# Patient Record
Sex: Female | Born: 1965
Health system: Southern US, Community
[De-identification: ages and names within clinical notes are randomized; demographics above are authoritative.]

## PROBLEM LIST (undated history)

## (undated) DIAGNOSIS — M419 Scoliosis, unspecified: Secondary | ICD-10-CM

## (undated) DIAGNOSIS — N183 Chronic kidney disease, stage 3 unspecified: Secondary | ICD-10-CM

## (undated) DIAGNOSIS — R7301 Impaired fasting glucose: Secondary | ICD-10-CM

## (undated) DIAGNOSIS — G43909 Migraine, unspecified, not intractable, without status migrainosus: Secondary | ICD-10-CM

## (undated) DIAGNOSIS — G4700 Insomnia, unspecified: Secondary | ICD-10-CM

## (undated) DIAGNOSIS — Q249 Congenital malformation of heart, unspecified: Secondary | ICD-10-CM

## (undated) DIAGNOSIS — E538 Deficiency of other specified B group vitamins: Secondary | ICD-10-CM

## (undated) DIAGNOSIS — I5189 Other ill-defined heart diseases: Secondary | ICD-10-CM

## (undated) DIAGNOSIS — Z1211 Encounter for screening for malignant neoplasm of colon: Secondary | ICD-10-CM

## (undated) DIAGNOSIS — G9332 Myalgic encephalomyelitis/chronic fatigue syndrome: Secondary | ICD-10-CM

## (undated) DIAGNOSIS — M797 Fibromyalgia: Secondary | ICD-10-CM

## (undated) DIAGNOSIS — U071 COVID-19: Secondary | ICD-10-CM

## (undated) DIAGNOSIS — I10 Essential (primary) hypertension: Secondary | ICD-10-CM

## (undated) DIAGNOSIS — J309 Allergic rhinitis, unspecified: Secondary | ICD-10-CM

## (undated) DIAGNOSIS — M47812 Spondylosis without myelopathy or radiculopathy, cervical region: Secondary | ICD-10-CM

## (undated) HISTORY — PX: CERVICAL SPINE SURGERY: SHX589

## (undated) HISTORY — DX: Encounter for screening for malignant neoplasm of colon: Z12.11

## (undated) HISTORY — DX: Myalgic encephalomyelitis/chronic fatigue syndrome: G93.32

## (undated) HISTORY — DX: Migraine, unspecified, not intractable, without status migrainosus: G43.909

## (undated) HISTORY — DX: Deficiency of other specified B group vitamins: E53.8

## (undated) HISTORY — PX: COLONOSCOPY: SHX174

## (undated) HISTORY — DX: Spondylosis without myelopathy or radiculopathy, cervical region: M47.812

## (undated) HISTORY — DX: Other ill-defined heart diseases: I51.89

## (undated) HISTORY — PX: CHOLECYSTECTOMY: SHX55

## (undated) HISTORY — DX: Chronic kidney disease, stage 3 unspecified: N18.30

## (undated) HISTORY — PX: APPENDECTOMY: SHX54

## (undated) HISTORY — PX: FOOT SURGERY: SHX648

## (undated) HISTORY — DX: Allergic rhinitis, unspecified: J30.9

## (undated) HISTORY — DX: COVID-19: U07.1

## (undated) HISTORY — PX: SPINE SURGERY: SHX786

## (undated) HISTORY — PX: CARDIAC SURGERY: SHX584

---
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
## 2004-01-22 ENCOUNTER — Ambulatory Visit (HOSPITAL_COMMUNITY): Admission: RE | Admit: 2004-01-22 | Discharge: 2004-01-22 | Payer: Self-pay | Admitting: Neurology

## 2004-02-07 ENCOUNTER — Ambulatory Visit (HOSPITAL_COMMUNITY): Admission: RE | Admit: 2004-02-07 | Discharge: 2004-02-08 | Payer: Self-pay | Admitting: Neurological Surgery

## 2004-03-11 ENCOUNTER — Encounter: Admission: RE | Admit: 2004-03-11 | Discharge: 2004-03-11 | Payer: Self-pay | Admitting: Neurological Surgery

## 2004-05-20 ENCOUNTER — Encounter: Admission: RE | Admit: 2004-05-20 | Discharge: 2004-05-20 | Payer: Self-pay | Admitting: Neurological Surgery

## 2004-12-16 ENCOUNTER — Encounter: Admission: RE | Admit: 2004-12-16 | Discharge: 2004-12-16 | Payer: Self-pay | Admitting: Neurological Surgery

## 2004-12-23 ENCOUNTER — Encounter: Admission: RE | Admit: 2004-12-23 | Discharge: 2004-12-23 | Payer: Self-pay | Admitting: Neurological Surgery

## 2008-02-12 ENCOUNTER — Emergency Department (HOSPITAL_COMMUNITY): Admission: EM | Admit: 2008-02-12 | Discharge: 2008-02-12 | Payer: Self-pay | Admitting: Emergency Medicine

## 2008-02-24 ENCOUNTER — Ambulatory Visit (HOSPITAL_COMMUNITY): Admission: RE | Admit: 2008-02-24 | Discharge: 2008-02-24 | Payer: Self-pay | Admitting: Cardiovascular Disease

## 2008-03-16 ENCOUNTER — Ambulatory Visit (HOSPITAL_COMMUNITY): Admission: RE | Admit: 2008-03-16 | Discharge: 2008-03-16 | Payer: Self-pay | Admitting: Cardiology

## 2008-03-16 ENCOUNTER — Encounter (INDEPENDENT_AMBULATORY_CARE_PROVIDER_SITE_OTHER): Payer: Self-pay | Admitting: Cardiology

## 2008-06-07 ENCOUNTER — Inpatient Hospital Stay (HOSPITAL_COMMUNITY): Admission: RE | Admit: 2008-06-07 | Discharge: 2008-06-08 | Payer: Self-pay | Admitting: Cardiology

## 2008-06-08 ENCOUNTER — Encounter (INDEPENDENT_AMBULATORY_CARE_PROVIDER_SITE_OTHER): Payer: Self-pay | Admitting: Cardiology

## 2008-10-09 ENCOUNTER — Ambulatory Visit: Payer: Self-pay | Admitting: Vascular Surgery

## 2008-10-09 ENCOUNTER — Encounter (INDEPENDENT_AMBULATORY_CARE_PROVIDER_SITE_OTHER): Payer: Self-pay | Admitting: Emergency Medicine

## 2008-10-09 ENCOUNTER — Emergency Department (HOSPITAL_COMMUNITY): Admission: EM | Admit: 2008-10-09 | Discharge: 2008-10-09 | Payer: Self-pay | Admitting: Emergency Medicine

## 2009-01-30 DIAGNOSIS — M6080 Other myositis, unspecified site: Secondary | ICD-10-CM | POA: Insufficient documentation

## 2009-01-30 DIAGNOSIS — I8000 Phlebitis and thrombophlebitis of superficial vessels of unspecified lower extremity: Secondary | ICD-10-CM | POA: Insufficient documentation

## 2009-01-30 DIAGNOSIS — I10 Essential (primary) hypertension: Secondary | ICD-10-CM | POA: Insufficient documentation

## 2009-01-30 DIAGNOSIS — IMO0001 Reserved for inherently not codable concepts without codable children: Secondary | ICD-10-CM | POA: Insufficient documentation

## 2009-01-30 DIAGNOSIS — G43909 Migraine, unspecified, not intractable, without status migrainosus: Secondary | ICD-10-CM | POA: Insufficient documentation

## 2010-12-28 ENCOUNTER — Encounter: Payer: Self-pay | Admitting: Neurology

## 2011-04-22 NOTE — Cardiovascular Report (Signed)
NAMELASONDRA, Courtney Haynes                ACCOUNT NO.:  [id number]   MEDICAL RECORD NO.:  [id number]          PATIENT TYPE:  OIB   LOCATION:  3705                         FACILITY:  [facility]   PHYSICIAN:  Vonna Kotyk R. Jacinto Halim, MD       DATE OF BIRTH:  1966/11/16   DATE OF PROCEDURE:  06/07/2008  DATE OF DISCHARGE:                            CARDIAC CATHETERIZATION   REFERRING PHYSICIAN:  Casimiro Needle L. Thad Ranger, MD   PROCEDURE PERFORMED:  1. Right heart catheterization.  2. Intracardiac echo-guided closure of the large patent foramen ovale.   INDICATIONS:  Courtney Haynes is a pleasant 45 year old female with  chronic migraine headache, which are lifestyle limiting.  She also has a  fenestrated atrial septal defect with right ventricular enlargement  associated with shortness of breath.  Given this, we felt that  proceeding with closure of the fenestrated atrial septal defect was  indicated.  Hence, she is brought to the catheterization lab.   HEMODYNAMIC DATA:  The right RA pressure was 8/7, mean 5 mmHg.  RA  saturation 75%.   RV pressure 25/3, end-diastolic pressure 7 mmHg.   PA pressure 23/9, mean 16 mmHg.  Saturation was 79%.   Pulmonary capillary wedge was 12/13, mean 9 mmHg.  Aortic saturation was  100%.   Qp/Qs was 1.2.  Cardiac output was 2.13.  The cardiac index was 2.84 by  Fick.   INTRACARDIAC ECHO DATA:  The intracardiac echocardiogram was carefully  performed, and all the structures were well visualized.  There was  normal cardiac anatomy, and the four pulmonary veins were identified on  right and left.  There was no significant valvular pathology.  There was  a large PFO and a fenestrated atrial septal defect in the inferior limb,  which was small.  There was strongly positive contrast study for right-  to-left shunting.   INTERVENTION DATA:  Successful closure of the PFO with implantation of  18-mm cribriform septal occluder with successful closure of the PFO with  successful  closure of the atrial septal defect with postprocedure double  contrast evaluation revealed complete obliteration of shunt.  By color  flow, there was complete obliteration of bidirectional shunting also.   There was no immediate complication noted.   RECOMMENDATIONS:  The patient will be continued on aspirin probably  indefinitely and Plavix for a period of 3 months.  She will be  discharged home in the morning.  She will follow up with Dr. Nanetta Batty in 2 weeks.   SHE WILL NEED ENDOCARDITIS PROPHYLAXIS FOR A PERIOD OF 6 MONTHS  FOLLOWING THE CLOSURE.   TECHNICAL PROCEDURE:  Under usual sterile precautions, using an 8-French  right femoral venous and a 9-French right femoral venous access, a  balloon-tip Swan-Ganz catheter was easily advanced into the RA, RV, and  PA, and right hemodynamics was carefully performed and the data was  analyzed and the catheter pulled out of body.   Intracardiac echo probe was advanced through the 9-French sheath and the  cardiac structures were carefully analyzed.   Giving heparin and keeping the ACT greater than  200, Amplatzer  cribriform septal occluder was advanced over a Mullen delivery sheath  after having crossed the large PFO very easily with a J-wire and placing  the J-wire into the left upper pulmonary vein.  The Amplatzer septal  occluder was advanced into the left atrium under echo guidance and the  left atrial disc was deployed again under echo guidance and then the  right atrial side of the device was deployed, and after confirming the  position of the device, the device was released with excellent results  and complete closure of the atrial septal defect.  The patient tolerated  the procedure well.  The catheters were then withdrawn out of the body.  The sheath was sutured in place.      Cristy Hilts. Jacinto Halim, MD  Electronically Signed     JRG/MEDQ  D:  06/07/2008  T:  06/07/2008  Job:  [id number]   cc:   Nanetta Batty, M.D.   Michael L. Thad Ranger, M.D.

## 2011-04-25 NOTE — Op Note (Signed)
NAME:  Courtney Haynes, Courtney Haynes                          ACCOUNT NO.:  [id number]   MEDICAL RECORD NO.:  [id number]                   PATIENT TYPE:  OIB   LOCATION:  3014                                 FACILITY:  [facility]   PHYSICIAN:  Tia Alert, MD                  DATE OF BIRTH:  1966/03/25   DATE OF PROCEDURE:  02/07/2004  DATE OF DISCHARGE:                                 OPERATIVE REPORT   PREOPERATIVE DIAGNOSIS:  Cervical disk herniation C6-7 to the right with  right C7 radiculopathy.   POSTOPERATIVE DIAGNOSIS:  Cervical disk herniation C6-7 to the right with  right C7 radiculopathy.   PROCEDURE:  1. Decompressive anterior cervical diskectomy C6-7 for central canal and     nerve root decompression.  2. Anterior cervical arthrodesis C6-7 utilizing a 7-mm Fibrillar allograft.  3. Anterior cervical plating C6-7 utilizing a 24-mm NuVasive anterior     cervical plate.   SURGEON:  Tia Alert, M.D.   ASSISTANT:  Donalee Citrin, M.D.   ANESTHESIA:  General endotracheal.   COMPLICATIONS:  None apparent.   INDICATIONS FOR PROCEDURE:  Courtney Haynes is a 45 year old white female who  presented with right arm pain.  She had undergone a previous ACDF at C5-6.  She had an MRI which showed a large disk herniation to the right at C6-7.  She had tried medical management for quite some time.  I recommended  anterior cervical diskectomy and fusion with plating at C6-7.  She  understood the risks, the benefits, and the alternatives and wished to  proceed.   DESCRIPTION OF PROCEDURE:  The patient was taken to the operating room and  after induction of adequate general endotracheal anesthesia, she was placed  in the supine position on the operating table.  Her left anterior cervical  region was prepped with Duraprep and then draped in the usual sterile  fashion.  We approached this from the left because she had a previous ACDF  from the left side.  Her left anterior cervical region was prepped  with  Duraprep and then draped in the usual sterile fashion.  Six mL of local  anesthesia was injected and then an incision was made through the old  incision and carried down to the platysma.  The platysma was elevated,  opened, and undermined with Metzenbaum scissors.  I then dissected in a  plane medial to the sternocleidomastoid muscle and lateral to the trachea  and esophagus to expose the anterior cervical spine at C6-7.  Intraoperative  fluoroscopy confirmed our level and then the longus coli muscles were taken  down bilaterally to expose the anterior cervical spine at C6-7.  The disk  space was incised with a 15-blade scalpel and initial diskectomy was done  with a pituitary rongeur and curved curets.  We then used the drill to drill  down the end plates down to the level of  the posterior longitudinal ligament  which was opened and then using the operating microscope for the remainder  of the procedure, the posterior longitudinal ligament along with the  posterior osteophytes were removed with the Kerrison punch.  She had a large  disk herniation noted at C6-7 on the right side.  This was removed with a  nerve hook.  We then were able to identify the C7 nerve root and follow it  out into the foramen past the pedicle level.  Once the decompression was  complete we irrigated with saline solution and then measured the interspace  to be 7 mm and tapped a 7-mm Fibrillar allograft into the interspace.  For  arthrodesis we then used a 24-mm NuVasive anterior cervical plate and then  placed two 13-mm screws into the body of C6 and two in the body of C7 and  then checked this under fluoroscopy to assure adequate plate placement.  We  then dried all bleeding points with bipolar cautery and irrigated with  copious amounts of bacitracin-containing saline solution.  Once meticulous  hemostasis was achieved, we closed the platysma with interrupted 3-0 Vicryl,  we closed the subcuticular tissue  with interrupted 3-0 Vicryl, and closed  the skin with Dermabond.  The drapes were removed and the patient was  awakened from general anesthesia and transported to the recovery room in  stable condition.  At the end of the procedure, all sponge, needle, and  instrument counts were correct.                                               Tia Alert, MD    DSJ/MEDQ  D:  02/07/2004  T:  02/07/2004  Job:  [id number]

## 2011-04-25 NOTE — Discharge Summary (Signed)
Courtney Haynes, Courtney Haynes                ACCOUNT NO.:  [id number]   MEDICAL RECORD NO.:  [id number]          PATIENT TYPE:  INP   LOCATION:  3705                         FACILITY:  [facility]   PHYSICIAN:  Cristy Hilts. Jacinto Halim, MD       DATE OF BIRTH:  1966-11-14   DATE OF ADMISSION:  06/07/2008  DATE OF DISCHARGE:  06/08/2008                               DISCHARGE SUMMARY   DISCHARGE DIAGNOSES:  1. Secundum atrial defect in the form of fenestrated atrial septal      defect, symptomatic.  2. Successful atrial septal defect closure.  3. Severe intractable migraine headaches.  4. Dyspnea on exertion.   DISCHARGE CONDITION:  Stable.   DISCHARGE MEDICATIONS:  1. Ambien CR as before at bedtime.  2. Rozerem 8 mg at bedtime as before.  3. Zyrtec 10 mg daily as before.  4. Zantac 150 mg at bedtime as before.  5. Carisoprodol 300 mg three times a day as before.  6. Aspirin 325 mg daily.  7. Trazodone 50 mg half a tablet at bedtime as before.  8. Keflex 500 mg every 6 hours as before.  9. Plavix 75 mg one daily for 3 months.  10.For all dental procedures, she need antibiotics prior to procedure.  11.She will always be on aspirin.   DISCHARGE INSTRUCTIONS:  1. Low-sodium, heart-healthy diet.  2. Increase activity slowly.  3. May shower or bathe.  4. No lifting for 2 days and no driving for 2 days.  5. Wash right groin cath site with soap and water.  Call us if any      bleeding, swelling, or drainage.  6. Follow up with Dr. Allyson Sabal on June 26, 2008, at 10:15 a.m.  7. She will need a repeat 2-D echo in 6 months and in 12 months.   HISTORY OF PRESENT ILLNESS:  A 45 year old patient of Dr. Hazle Coca  referred to Dr. Jacinto Halim for closure of atrial septal defect.  She has  severe intractable migraine headaches with fenestrated atrial septal  defect with right ventricular enlargement associated with shortness of  breath.  Dr. Jacinto Halim saw her in the office on June 02, 2008, and plans  were made for elective  closure of the ASD.  She was brought into the  hospital on June 07, 2008, as an outpatient for elective procedure and  underwent closure of the ASD and tolerated the procedure well.   By June 08, 2008, she was stable.  No chest pain or no shortness of  breath.  Vital signs were stable and ambulated without problems.  She  had a 2-D echo prior to discharge and she will follow up as an  outpatient.  She would need repeat echo's in 6 months and 12 months.   PROCEDURE:  ASD closure per Dr. Jacinto Halim on June 07, 2008.   LABORATORY VALUES:  1. Hemoglobin 13 and hematocrit 37.  Urine pregnancy test was      negative.  See Dr. Verl Dicker dictated, device insertion report.  2. A 2-D echo, LV size was normal.  LV systolic function normal.  No  left ventricular regional wall motion abnormalities.  Aortic valve      was trileaflet.  Aortic valve thickness was normal.  Normal aortic      valve leaflet excursion.  No evidence for an aortic valve stenosis.      No significant aortic valvular regurgitation.  No evidence for      mitral stenosis.  No significant mitral valve regurgitation.  RV      size was normal.  RV systolic function was normal.  RV wall      thickness was normal.  Right atrial size was normal.   No pericardial effusion.  Left atrial size was normal.  Intraatrial  septal closure device in place.  Device appears to be well seated with  no significant Doppler evidence or residual shunt.   The patient was discharged home and will follow up as an outpatient.      Darcella Gasman. [place], N.P.      Cristy Hilts. Jacinto Halim, MD  Electronically Signed    LRI/MEDQ  D:  08/01/2008  T:  08/02/2008  Job:  [id number]   cc:   Cristy Hilts. Jacinto Halim, MD  Marolyn Hammock. Thad Ranger, M.D.

## 2011-09-01 LAB — LIPID PANEL
Cholesterol: 113
LDL Cholesterol: 72
VLDL: 11

## 2011-09-01 LAB — I-STAT 8, (EC8 V) (CONVERTED LAB)
Bicarbonate: 24.6 — ABNORMAL HIGH
HCT: 38
Hemoglobin: 12.9
Operator id: 151321
Sodium: 136
TCO2: 26

## 2011-09-01 LAB — HEPATIC FUNCTION PANEL
AST: 17
Bilirubin, Direct: <0.1
Total Bilirubin: 0.5

## 2011-09-01 LAB — POCT CARDIAC MARKERS: Troponin i, poc: <0.05

## 2011-09-01 LAB — POCT I-STAT CREATININE: Operator id: 151321

## 2011-09-04 LAB — POCT I-STAT 3, VENOUS BLOOD GAS (G3P V)
Bicarbonate: 21.4
Bicarbonate: 22.9
O2 Saturation: 73
Operator id: 113391
Operator id: 113391
Operator id: 113391
TCO2: 24
pCO2, Ven: 39.4 — ABNORMAL LOW
pCO2, Ven: 41.2 — ABNORMAL LOW
pCO2, Ven: 41.4 — ABNORMAL LOW
pCO2, Ven: 43.9 — ABNORMAL LOW
pH, Ven: 7.326 — ABNORMAL HIGH
pH, Ven: 7.342 — ABNORMAL HIGH
pH, Ven: 7.35 — ABNORMAL HIGH
pH, Ven: 7.354 — ABNORMAL HIGH
pO2, Ven: 39
pO2, Ven: 41
pO2, Ven: 42

## 2011-09-04 LAB — HEMOGLOBIN AND HEMATOCRIT, BLOOD
HCT: 37.4
Hemoglobin: 13

## 2011-09-09 LAB — CBC
HCT: 37.8
Hemoglobin: 13.1
MCHC: 34.6
RBC: 4.06
RDW: 12.3

## 2011-09-09 LAB — POCT I-STAT, CHEM 8
Calcium, Ion: 1.2
Chloride: 102
Creatinine, Ser: 1
Glucose, Bld: 89
Potassium: 3.8

## 2011-09-09 LAB — DIFFERENTIAL
Basophils Relative: 1
Lymphs Abs: 2
Monocytes Absolute: 0.5
Monocytes Relative: 8
Neutro Abs: 3.1
Neutrophils Relative %: 53

## 2011-12-04 ENCOUNTER — Emergency Department
Admission: EM | Admit: 2011-12-04 | Discharge: 2011-12-04 | Disposition: A | Discharge status: Home / Self Care | Payer: Self-pay | Source: Home / Self Care

## 2011-12-04 ENCOUNTER — Encounter: Payer: Self-pay | Admitting: Emergency Medicine

## 2011-12-04 DIAGNOSIS — R6889 Other general symptoms and signs: Secondary | ICD-10-CM

## 2011-12-04 DIAGNOSIS — M797 Fibromyalgia: Secondary | ICD-10-CM

## 2011-12-04 DIAGNOSIS — J111 Influenza due to unidentified influenza virus with other respiratory manifestations: Secondary | ICD-10-CM

## 2011-12-04 DIAGNOSIS — IMO0001 Reserved for inherently not codable concepts without codable children: Secondary | ICD-10-CM

## 2011-12-04 HISTORY — DX: Fibromyalgia: M79.7

## 2011-12-04 MED ORDER — OSELTAMIVIR PHOSPHATE 75 MG PO CAPS: 75.0000 mg | ORAL_CAPSULE | Freq: Two times a day (BID) | ORAL | Status: AC

## 2011-12-04 MED ORDER — BENZONATATE 200 MG PO CAPS: 200.0000 mg | ORAL_CAPSULE | Freq: Every day | ORAL | Status: AC

## 2011-12-04 MED ORDER — OXYCODONE HCL 20 MG PO TB12
20.0000 mg | ORAL_TABLET | Freq: Two times a day (BID) | ORAL | Status: AC
Start: 2011-12-04 — End: 2011-12-18

## 2011-12-04 NOTE — ED Notes (Signed)
Congestion, fever, fatigue, aches, sore throat x 3 days.

## 2011-12-04 NOTE — ED Provider Notes (Signed)
History     CSN: [id number]  Arrival date & time 12/04/11  1408   None     Chief Complaint  Patient presents with  . Nasal Congestion      HPI Comments: HPI : Flu symptoms for about 2 days. Fever to 102 with chills, sweats, myalgias, fatigue, headache. Symptoms are progressively worsening, despite trying OTC fever reducing medicine and rest and fluids. Has decreased appetite, but tolerating some liquids by mouth.  Patient has not had a flu shot. She also has a history of fibromyalgia, and requests rx for small quantity of her oxycontin until follow-up with PCP  Review of Systems: Positive for fatigue, mild nasal congestion, mild sore throat, mild swollen anterior neck glands, mild cough. Negative for acute vision changes, stiff neck, focal weakness, syncope, seizures, respiratory distress, vomiting, diarrhea, GU symptoms.   The history is provided by the patient.    Past Medical History  Diagnosis Date  . Fibromyalgia     Past Surgical History  Procedure Date  . Migraines     No family history on file.  History  Substance Use Topics  . Smoking status: Never Smoker   . Smokeless tobacco: Not on file  . Alcohol Use: No    OB History    Grav Para Term Preterm Abortions TAB SAB Ect Mult Living                  Review of Systems  Allergies  Review of patient's allergies indicates no known allergies.  Home Medications   Current Outpatient Rx  Name Route Sig Dispense Refill  . CARISOPRODOL 350 MG PO TABS Oral Take 350 mg by mouth 4 (four) times daily as needed.      Marland Kitchen ELETRIPTAN HYDROBROMIDE 20 MG PO TABS Oral One tablet by mouth as needed for migraine headache.  If the headache improves and then returns, dose may be repeated after 2 hours have elapsed since first dose (do not exceed 80 mg per day). may repeat in 2 hours if necessary     . MOMETASONE FUROATE 50 MCG/ACT NA SUSP Nasal Place into the nose daily.      Marland Kitchen MONTELUKAST SODIUM 10 MG PO TABS Oral Take 10  mg by mouth at bedtime.      Marland Kitchen NARATRIPTAN HCL 2.5 MG PO TABS Oral Take 2.5 mg by mouth as needed. Take one (1) tablet at onset of headache; if returns or does not resolve, may repeat after 4 hours; do not exceed five (5) mg in 24 hours.     . OXYCODONE HCL ER 20 MG PO TB12 Oral Take 20 mg by mouth every 12 (twelve) hours.      Marland Kitchen ZOLPIDEM TARTRATE ER 12.5 MG PO TBCR Oral Take 12.5 mg by mouth at bedtime as needed.      Marland Kitchen BENZONATATE 200 MG PO CAPS Oral Take 1 capsule (200 mg total) by mouth at bedtime. Take as needed for cough 12 capsule 0  . OSELTAMIVIR PHOSPHATE 75 MG PO CAPS Oral Take 1 capsule (75 mg total) by mouth every 12 (twelve) hours. 10 capsule 0  . OXYCODONE HCL ER 20 MG PO TB12 Oral Take 1 tablet (20 mg total) by mouth every 12 (twelve) hours. As needed for fibromyalgia pain 6 tablet 0    BP 122/86  Pulse 109  Temp(Src) 98.8 F (37.1 C) (Oral)  Resp 16  Ht 5\' 5"  (1.651 m)  Wt 130 lb (58.968 kg)  BMI 21.63 kg/m2  SpO2 99%  LMP 11/04/2011  Physical Exam Nursing notes and Vital Signs reviewed. Appearance:  Patient appears healthy, stated age, and in no acute distress Eyes:  Pupils are equal, round, and reactive to light and accomodation.  Extraocular movement is intact.  Conjunctivae are not inflamed  Ears:  Canals normal.  Tympanic membranes normal.  Nose:  Mildly congested turbinates.  No sinus tenderness.   Pharynx:  Normal Neck:  Supple.  Tender shotty anterior/posterior nodes are palpated bilaterally  Lungs:  Clear to auscultation.  Breath sounds are equal.  Chest:  Distinct tenderness to palpation over the mid-sternum.  Heart:  Regular rate and rhythm without murmurs, rubs, or gallops.  Abdomen:  Nontender without masses or hepatosplenomegaly.  Bowel sounds are present.  No CVA or flank tenderness.  Extremities:  No edema.  No calf tenderness Skin:  No rash present.   ED Course  Procedures  none      1. Influenza-like illness   2. Fibromyalgia       MDM    Begin Tamiflu today.  Tessalon at bedtime.  Will give small quantity of Oxycontin (#6 tabs) until she can followup with her PCP for fibromyalgia. Take Mucinex D (guaifenesin with decongestant) twice daily for congestion.  Increase fluid intake, rest. May use Afrin nasal spray (or generic oxymetazoline) twice daily for about 5 days.  Also recommend using saline nasal spray several times daily and/or saline nasal irrigation. Stop all antihistamines for now, and other non-prescription cough/cold preparations. Follow-up with family doctor if not improving about 5 days.         Donna Christen, MD 12/04/11 [phone]

## 2011-12-04 NOTE — ED Notes (Signed)
Did not have Flu vaccination this season.

## 2012-03-25 DIAGNOSIS — M797 Fibromyalgia: Secondary | ICD-10-CM | POA: Insufficient documentation

## 2014-12-12 ENCOUNTER — Emergency Department
Admission: EM | Admit: 2014-12-12 | Discharge: 2014-12-12 | Disposition: A | Discharge status: Home / Self Care | Payer: BLUE CROSS/BLUE SHIELD | Source: Home / Self Care | Attending: Emergency Medicine | Admitting: Emergency Medicine

## 2014-12-12 ENCOUNTER — Encounter: Payer: Self-pay | Admitting: *Deleted

## 2014-12-12 DIAGNOSIS — J0120 Acute ethmoidal sinusitis, unspecified: Secondary | ICD-10-CM

## 2014-12-12 HISTORY — DX: Congenital malformation of heart, unspecified: Q24.9

## 2014-12-12 HISTORY — DX: Insomnia, unspecified: G47.00

## 2014-12-12 HISTORY — DX: Essential (primary) hypertension: I10

## 2014-12-12 HISTORY — DX: Scoliosis, unspecified: M41.9

## 2014-12-12 MED ORDER — AMOXICILLIN-POT CLAVULANATE 875-125 MG PO TABS: 1.0000 | ORAL_TABLET | Freq: Two times a day (BID) | ORAL | Status: DC

## 2014-12-12 NOTE — ED Notes (Signed)
Pt c/o nasal congestion and sinus pressure x 3 wks, post URI. Denies fever.

## 2014-12-12 NOTE — Discharge Instructions (Signed)

## 2014-12-12 NOTE — ED Provider Notes (Signed)
CSN: [id number]     Arrival date & time 12/12/14  1611 History   First MD Initiated Contact with Patient 12/12/14 1641     Chief Complaint  Patient presents with  . Nasal Congestion   (Consider location/radiation/quality/duration/timing/severity/associated sxs/prior Treatment) Patient is a 49 y.o. female presenting with cough. The history is provided by the patient. No language interpreter was used.  Cough Cough characteristics:  Non-productive Severity:  Moderate Onset quality:  Gradual Timing:  Constant Progression:  Worsening Context: upper respiratory infection   Relieved by:  Nothing Worsened by:  Nothing tried Associated symptoms: rhinorrhea and sinus congestion   Associated symptoms: no shortness of breath and no sore throat     Past Medical History  Diagnosis Date  . Fibromyalgia   . Hypertension   . Migraine   . Heart defect   . Insomnia   . Scoliosis    Past Surgical History  Procedure Laterality Date  . Migraines    . Back surgery    . Appendectomy    . Cardiac surgery    . Ankle surgery    . Cholecystectomy     Family History  Problem Relation Age of Onset  . Heart disease Father   . Diabetes Father   . Kidney disease Father   . Stroke Father    History  Substance Use Topics  . Smoking status: Never Smoker   . Smokeless tobacco: Not on file  . Alcohol Use: No   OB History    No data available     Review of Systems  HENT: Positive for rhinorrhea and sinus pressure. Negative for sore throat and tinnitus.   Respiratory: Positive for cough. Negative for shortness of breath.   All other systems reviewed and are negative. Pt complains of sinus pain and congestion.  Pt reports pain in area above eyes.  Pt reports she has had sinus infection and this feels the same.  Allergies  Review of patient's allergies indicates no known allergies.  Home Medications   Prior to Admission medications   Medication Sig Start Date End Date Taking? Authorizing  Provider  beclomethasone (QVAR) 40 MCG/ACT inhaler Inhale into the lungs 2 (two) times daily.   Yes Historical Provider, MD  Beclomethasone Diprop, Nasal, (QNASL NA) Place into the nose.   Yes Historical Provider, MD  carisoprodol (SOMA) 350 MG tablet Take 350 mg by mouth 4 (four) times daily as needed.     Yes Historical Provider, MD  eletriptan (RELPAX) 20 MG tablet One tablet by mouth as needed for migraine headache.  If the headache improves and then returns, dose may be repeated after 2 hours have elapsed since first dose (do not exceed 80 mg per day). may repeat in 2 hours if necessary    Yes Historical Provider, MD  montelukast (SINGULAIR) 10 MG tablet Take 10 mg by mouth at bedtime.     Yes Historical Provider, MD  naratriptan (AMERGE) 2.5 MG tablet Take 2.5 mg by mouth as needed. Take one (1) tablet at onset of headache; if returns or does not resolve, may repeat after 4 hours; do not exceed five (5) mg in 24 hours.    Yes Historical Provider, MD  oxyCODONE (OXYCONTIN) 20 MG 12 hr tablet Take 20 mg by mouth every 12 (twelve) hours.     Yes Historical Provider, MD  zolpidem (AMBIEN CR) 12.5 MG CR tablet Take 12.5 mg by mouth at bedtime as needed.     Yes Historical Provider, MD  zonisamide (ZONEGRAN) 50 MG capsule Take 50 mg by mouth daily.   Yes Historical Provider, MD  amoxicillin-clavulanate (AUGMENTIN) 875-125 MG per tablet Take 1 tablet by mouth every 12 (twelve) hours. 12/12/14   Elson AreasLeslie K Sofia, PA-C  mometasone (NASONEX) 50 MCG/ACT nasal spray Place into the nose daily.      Historical Provider, MD   BP 132/86 mmHg  Pulse 85  Temp(Src) 98.5 F (36.9 C) (Oral)  Resp 16  Ht 5\' 5"  (1.651 m)  Wt 140 lb (63.504 kg)  BMI 23.30 kg/m2  SpO2 98%  LMP 11/19/2014 Physical Exam  Constitutional: She is oriented to person, place, and time. She appears well-developed and well-nourished.  HENT:  Head: Normocephalic and atraumatic.  Mouth/Throat: Posterior oropharyngeal erythema present.   Tender maxillary sinuses, tender frontal and ethmoid area  Eyes: Conjunctivae and EOM are normal. Pupils are equal, round, and reactive to light.  Neck: Normal range of motion. Neck supple.  Cardiovascular: Normal rate.   Pulmonary/Chest: Effort normal.  Abdominal: Soft.  Musculoskeletal: Normal range of motion.  Neurological: She is alert and oriented to person, place, and time.  Skin: Skin is warm and dry.  Psychiatric: She has a normal mood and affect.    ED Course  Procedures (including critical care time) Labs Review Labs Reviewed - No data to display  Imaging Review No results found.   MDM   1. Acute ethmoidal sinusitis, recurrence not specified      Augmentin AVS Return if any problems.  Elson AreasLeslie K Sofia, PA-C 12/12/14 1837  Lonia SkinnerLeslie K CamasSofia, PA-C 12/12/14 Paulo [id number]

## 2015-09-07 ENCOUNTER — Encounter: Payer: Self-pay | Admitting: Emergency Medicine

## 2015-09-07 ENCOUNTER — Emergency Department
Admission: EM | Admit: 2015-09-07 | Discharge: 2015-09-07 | Disposition: A | Discharge status: Home / Self Care | Payer: Medicaid Other | Source: Home / Self Care | Attending: Family Medicine | Admitting: Family Medicine

## 2015-09-07 DIAGNOSIS — R2231 Localized swelling, mass and lump, right upper limb: Secondary | ICD-10-CM

## 2015-09-07 DIAGNOSIS — R590 Localized enlarged lymph nodes: Secondary | ICD-10-CM

## 2015-09-07 MED ORDER — CEPHALEXIN 500 MG PO CAPS: 500.0000 mg | ORAL_CAPSULE | Freq: Two times a day (BID) | ORAL | Status: DC

## 2015-09-07 NOTE — Discharge Instructions (Signed)

## 2015-09-07 NOTE — ED Notes (Addendum)
Knot in right axilla x 1 week

## 2015-09-07 NOTE — ED Provider Notes (Signed)
CSN: [id number]     Arrival date & time 09/07/15  1210 History   First MD Initiated Contact with Patient 09/07/15 1215     Chief Complaint  Patient presents with  . Abscess   (Consider location/radiation/quality/duration/timing/severity/associated sxs/prior Treatment) HPI  Pt is a 49yo female with hx of fibromyalgia, presenting to [facility] with concern for a tender Right axillary nodule for 1 week.  Pt states she thinks it may be an abscess. Pain is aching and sore. Knot has not increased in size since onset. She has had abscesses previously but states those usually worsen a lot faster.  Denies fever, chills, n/v/d.  Denies weight loss. Denies night sweats. Denies Right breast pain.  Pt does not have a PCP as she recently had a change in insurance and was told she could not establish care with a PCP on her new insurance for 6 months.  Pt states she does not want to have to wait this long as she is concerned she has an underlying infection causing the lump.  Past Medical History  Diagnosis Date  . Fibromyalgia   . Hypertension   . Migraine   . Heart defect   . Insomnia   . Scoliosis    Past Surgical History  Procedure Laterality Date  . Migraines    . Back surgery    . Appendectomy    . Cardiac surgery    . Ankle surgery    . Cholecystectomy     Family History  Problem Relation Age of Onset  . Heart disease Father   . Diabetes Father   . Kidney disease Father   . Stroke Father    Social History  Substance Use Topics  . Smoking status: Never Smoker   . Smokeless tobacco: None  . Alcohol Use: No   OB History    No data available     Review of Systems  Constitutional: Negative for fever, chills and unexpected weight change.  HENT: Negative for congestion, ear pain, sore throat, trouble swallowing and voice change.   Respiratory: Negative for cough and shortness of breath.   Cardiovascular: Negative for chest pain and palpitations.  Gastrointestinal: Negative for nausea,  vomiting, abdominal pain and diarrhea.  Musculoskeletal: Negative for myalgias, back pain and arthralgias.       Lump under Right axilla  Skin: Negative for rash and wound.  All other systems reviewed and are negative.   Allergies  Review of patient's allergies indicates not on file.  Home Medications   Prior to Admission medications   Medication Sig Start Date End Date Taking? Authorizing Provider  amoxicillin-clavulanate (AUGMENTIN) 875-125 MG per tablet Take 1 tablet by mouth every 12 (twelve) hours. 12/12/14   Elson Areas, PA-C  beclomethasone (QVAR) 40 MCG/ACT inhaler Inhale into the lungs 2 (two) times daily.    Historical Provider, MD  Beclomethasone Diprop, Nasal, (QNASL NA) Place into the nose.    Historical Provider, MD  carisoprodol (SOMA) 350 MG tablet Take 350 mg by mouth 4 (four) times daily as needed.      Historical Provider, MD  cephALEXin (KEFLEX) 500 MG capsule Take 1 capsule (500 mg total) by mouth 2 (two) times daily. For 10 days 09/07/15   Junius Finner, PA-C  eletriptan (RELPAX) 20 MG tablet One tablet by mouth as needed for migraine headache.  If the headache improves and then returns, dose may be repeated after 2 hours have elapsed since first dose (do not exceed 80 mg per day). may  repeat in 2 hours if necessary     Historical Provider, MD  mometasone (NASONEX) 50 MCG/ACT nasal spray Place into the nose daily.      Historical Provider, MD  montelukast (SINGULAIR) 10 MG tablet Take 10 mg by mouth at bedtime.      Historical Provider, MD  naratriptan (AMERGE) 2.5 MG tablet Take 2.5 mg by mouth as needed. Take one (1) tablet at onset of headache; if returns or does not resolve, may repeat after 4 hours; do not exceed five (5) mg in 24 hours.     Historical Provider, MD  oxyCODONE (OXYCONTIN) 20 MG 12 hr tablet Take 20 mg by mouth every 12 (twelve) hours.      Historical Provider, MD  zolpidem (AMBIEN CR) 12.5 MG CR tablet Take 12.5 mg by mouth at bedtime as needed.       Historical Provider, MD  zonisamide (ZONEGRAN) 50 MG capsule Take 50 mg by mouth daily.    Historical Provider, MD   Meds Ordered and Administered this Visit  Medications - No data to display  BP 128/84 mmHg  Pulse 69  Temp(Src) 97.9 F (36.6 C) (Oral)  Ht  (1.651 m)  Wt 155 lb (70.308 kg)  BMI 25.79 kg/m2  SpO2 99% No data found.   Physical Exam  Constitutional: She appears well-developed and well-nourished. No distress.  HENT:  Head: Normocephalic and atraumatic.  Eyes: Conjunctivae are normal. No scleral icterus.  Neck: Normal range of motion. Neck supple.  No midline bone tenderness, no crepitus or step-offs.   Cardiovascular: Normal rate, regular rhythm and normal heart sounds.   Pulmonary/Chest: Effort normal and breath sounds normal. No respiratory distress. She has no wheezes. She has no rales. She exhibits no tenderness.     No Right breast tenderness.  Abdominal: Soft. She exhibits no distension. There is no tenderness.  Musculoskeletal: Normal range of motion. She exhibits tenderness. She exhibits no edema.   Full ROM Right shoulder.   Neurological: She is alert.  Skin: Skin is warm and dry. She is not diaphoretic. No erythema.  Right axilla: skin in tact, no erythema, warmth or ecchymosis.  Nursing note and vitals reviewed.   ED Course  Procedures (including critical care time)  Labs Review Labs Reviewed - No data to display  Imaging Review No results found.   MDM   1. Axillary lump, right   2. Lymphadenopathy, axillary    Pt c/o lump under Right axilla. Exam most c/w lymphadenopathy of unknown origin. Low concern for abscess, however, pt does not currently have a PCP and quick access to f/u care. Pt also concerned for underlying infection. Discussed risks/benefits of trial of antibiotics. Pt would like to try a course of keflex.  In meantime, encouraged pt to speak with her case manager to help find a PCP that she can establish care with sooner  than 6 months out. Resource for [facility] breast center also provided (pt prefers going to [place] over [place]) as blood work and possible biopsy may need to be performed to help determine cause of Right axillary lymphadenopathy.   Patient verbalized understanding and agreement with treatment plan.     Junius Finner, PA-C 09/07/15 1357

## 2016-01-28 ENCOUNTER — Emergency Department
Admission: EM | Admit: 2016-01-28 | Discharge: 2016-01-28 | Disposition: A | Discharge status: Home / Self Care | Payer: Medicaid Other | Source: Home / Self Care | Attending: Family Medicine | Admitting: Family Medicine

## 2016-01-28 ENCOUNTER — Encounter: Payer: Self-pay | Admitting: Emergency Medicine

## 2016-01-28 DIAGNOSIS — J019 Acute sinusitis, unspecified: Secondary | ICD-10-CM

## 2016-01-28 DIAGNOSIS — H00016 Hordeolum externum left eye, unspecified eyelid: Secondary | ICD-10-CM

## 2016-01-28 MED ORDER — POLYMYXIN B-TRIMETHOPRIM 10000-0.1 UNIT/ML-% OP SOLN: 1.0000 [drp] | OPHTHALMIC | Status: DC

## 2016-01-28 MED ORDER — AMOXICILLIN-POT CLAVULANATE 875-125 MG PO TABS: 1.0000 | ORAL_TABLET | Freq: Two times a day (BID) | ORAL | Status: DC

## 2016-01-28 NOTE — ED Notes (Signed)
Sinus headache, pressure, runny nose, congestion, yellow mucus x 2-3 weeks Left eye red, swollen, hurts x 1 week

## 2016-01-28 NOTE — ED Provider Notes (Signed)
CSN: [id number]     Arrival date & time 01/28/16  1050 History   First MD Initiated Contact with Patient 01/28/16 1125     Chief Complaint  Patient presents with  . Sinus Problem   (Consider location/radiation/quality/duration/timing/severity/associated sxs/prior Treatment) HPI The pt is a 50yo female presenting to [facility] with c/o 2-3 weeks if sinus congestion, frontal headache with pressure and rhinorrhea, yellow mucous production, and now Left upper eyelid pain and redness for 1 week. She has been taking OTC cough/cold medication as well as using her Flonase and cough drops but no relief.  She is concerned she has a sinus infection. She has had low grade subjective fevers. Denies n/v/d. Denies change in vision. She does not wear contacts.  The county she [place] in did send out a flu advisory but she does not feel she has the flu as she has had primarily sinus symptoms for 2-3 weeks.   Past Medical History  Diagnosis Date  . Fibromyalgia   . Hypertension   . Migraine   . Heart defect   . Insomnia   . Scoliosis    Past Surgical History  Procedure Laterality Date  . Migraines    . Back surgery    . Appendectomy    . Cardiac surgery    . Ankle surgery    . Cholecystectomy     Family History  Problem Relation Age of Onset  . Heart disease Father   . Diabetes Father   . Kidney disease Father   . Stroke Father    Social History  Substance Use Topics  . Smoking status: Never Smoker   . Smokeless tobacco: None  . Alcohol Use: No   OB History    No data available     Review of Systems  Constitutional: Positive for fever ( subjective). Negative for chills.  HENT: Positive for congestion, postnasal drip, rhinorrhea and sinus pressure. Negative for ear pain, sore throat, trouble swallowing and voice change.   Eyes: Positive for pain, discharge, redness and itching. Negative for photophobia and visual disturbance.  Respiratory: Negative for cough and shortness of breath.     Cardiovascular: Negative for chest pain and palpitations.  Gastrointestinal: Negative for nausea, vomiting, abdominal pain and diarrhea.  Musculoskeletal: Negative for myalgias, back pain and arthralgias.  Skin: Negative for rash.  Neurological: Positive for headaches. Negative for dizziness and light-headedness.    Allergies  Review of patient's allergies indicates no known allergies.  Home Medications   Prior to Admission medications   Medication Sig Start Date End Date Taking? Authorizing Provider  fluticasone (FLONASE) 50 MCG/ACT nasal spray Place into both nostrils daily.   Yes Historical Provider, MD  amoxicillin-clavulanate (AUGMENTIN) 875-125 MG per tablet Take 1 tablet by mouth every 12 (twelve) hours. 12/12/14   Elson Areas, PA-C  amoxicillin-clavulanate (AUGMENTIN) 875-125 MG tablet Take 1 tablet by mouth 2 (two) times daily. One po bid x 10 days 01/28/16   Junius Finner, PA-C  beclomethasone (QVAR) 40 MCG/ACT inhaler Inhale into the lungs 2 (two) times daily.    Historical Provider, MD  Beclomethasone Diprop, Nasal, (QNASL NA) Place into the nose.    Historical Provider, MD  carisoprodol (SOMA) 350 MG tablet Take 350 mg by mouth 4 (four) times daily as needed.      Historical Provider, MD  cephALEXin (KEFLEX) 500 MG capsule Take 1 capsule (500 mg total) by mouth 2 (two) times daily. For 10 days 09/07/15   Junius Finner, PA-C  eletriptan (  RELPAX) 20 MG tablet One tablet by mouth as needed for migraine headache.  If the headache improves and then returns, dose may be repeated after 2 hours have elapsed since first dose (do not exceed 80 mg per day). may repeat in 2 hours if necessary     Historical Provider, MD  mometasone (NASONEX) 50 MCG/ACT nasal spray Place into the nose daily.      Historical Provider, MD  montelukast (SINGULAIR) 10 MG tablet Take 10 mg by mouth at bedtime.      Historical Provider, MD  naratriptan (AMERGE) 2.5 MG tablet Take 2.5 mg by mouth as needed. Take  one (1) tablet at onset of headache; if returns or does not resolve, may repeat after 4 hours; do not exceed five (5) mg in 24 hours.     Historical Provider, MD  oxyCODONE (OXYCONTIN) 20 MG 12 hr tablet Take 20 mg by mouth every 12 (twelve) hours.      Historical Provider, MD  trimethoprim-polymyxin b (POLYTRIM) ophthalmic solution Place 1 drop into the left eye every 4 (four) hours. For 5-7 days 01/28/16   Junius Finner, PA-C  zolpidem (AMBIEN CR) 12.5 MG CR tablet Take 12.5 mg by mouth at bedtime as needed.      Historical Provider, MD  zonisamide (ZONEGRAN) 50 MG capsule Take 50 mg by mouth daily.    Historical Provider, MD   Meds Ordered and Administered this Visit  Medications - No data to display  BP 141/89 mmHg  Pulse 83  Temp(Src) 98.1 F (36.7 C) (Oral)  Ht  (1.651 m)  Wt 140 lb (63.504 kg)  BMI 23.30 kg/m2  SpO2 98% No data found.   Physical Exam  Constitutional: She appears well-developed and well-nourished. No distress.  HENT:  Head: Normocephalic and atraumatic.  Right Ear: A middle ear effusion is present.  Left Ear: A middle ear effusion is present.  Nose: Mucosal edema and rhinorrhea present. Right sinus exhibits maxillary sinus tenderness and frontal sinus tenderness. Left sinus exhibits maxillary sinus tenderness and frontal sinus tenderness.  Mouth/Throat: Uvula is midline, oropharynx is clear and moist and mucous membranes are normal.  Eyes: Conjunctivae and EOM are normal. Pupils are equal, round, and reactive to light. Left eye exhibits hordeolum. Left eye exhibits no chemosis, no discharge and no exudate. No foreign body present in the left eye. Left conjunctiva is not injected. Left conjunctiva has no hemorrhage. No scleral icterus.    Left upper eyelid, medial canthus: mild erythema, edema, and tenderness. No active discharge.   EOMs normal, conjunctiva normal.  Neck: Normal range of motion. Neck supple.  Cardiovascular: Normal rate, regular rhythm and  normal heart sounds.   Pulmonary/Chest: Effort normal and breath sounds normal. No respiratory distress. She has no wheezes. She has no rales.  Abdominal: Soft. She exhibits no distension. There is no tenderness.  Musculoskeletal: Normal range of motion.  Neurological: She is alert.  Skin: Skin is warm and dry. She is not diaphoretic.  Nursing note and vitals reviewed.   ED Course  Procedures (including critical care time)  Labs Review Labs Reviewed - No data to display  Imaging Review No results found.    MDM   1. Acute rhinosinusitis   2. Hordeolum external, left    Pt c/o sinus congestion and pressure, gradually worsening for 2-3 weeks. Sinus tenderness noted on exam.  Left hordeolum also noted w/o evidence of periorbital cellulitis. Rx: Augmentin and Polytrim Encouraged pt to start PO antibiotics as  well as warm compresses for 3-4 days, if symptoms in her Left eye not improving, she may start using the ophthalmic drops.  If symptoms worsen, may start sooner.  F/u with PCP in 4-5 days if symptoms not improving. Patient verbalized understanding and agreement with treatment plan.     Junius Finner, PA-C 01/28/16 1148

## 2016-01-28 NOTE — Discharge Instructions (Signed)
You may take 400-600mg  Ibuprofen (Motrin) every 6-8 hours for fever and pain  Alternate with Tylenol  You may take  Tylenol every 4-6 hours as needed for fever and pain  Follow-up with your primary care provider next week for recheck of symptoms if not improving.  Be sure to drink plenty of fluids and rest, at least 8hrs of sleep a night, preferably more while you are sick. Return urgent care or go to closest ER if you cannot keep down fluids/signs of dehydration, fever not reducing with Tylenol, difficulty breathing/wheezing, stiff neck, worsening condition, or other concerns (see below)  Please take antibiotics as prescribed and be sure to complete entire course even if you start to feel better to ensure infection does not come back.   You may hold off on using the antibiotic eye drops for 4-5 days to see if the oral antibiotic, Augmentin, as well as warm compresses such as a warm damp washcloth 15-20 minutes at a time 2-3 times a day helps with Left eye symptoms.  If symptoms persist, you may start using the eye drops as prescribed.  If no improvement after 3-4 days of use, or symptoms worsen with use, please seek medical attention for further evaluation.    Sinus Rinse WHAT IS A SINUS RINSE? A sinus rinse is a simple home treatment that is used to rinse your sinuses with a sterile mixture of salt and water (saline solution). Sinuses are air-filled spaces in your skull behind the bones of your face and forehead that open into your nasal cavity. You will use the following:  Saline solution.  Neti pot or spray bottle. This releases the saline solution into your nose and through your sinuses. Neti pots and spray bottles can be purchased at [employer], a health food store, or online. WHEN WOULD I DO A SINUS RINSE? A sinus rinse can help to clear mucus, dirt, dust, or pollen from the nasal cavity. You may do a sinus rinse when you have a cold, a virus, nasal allergy symptoms, a sinus  infection, or stuffiness in the nose or sinuses. If you are considering a sinus rinse:  Ask your child's health care provider before performing a sinus rinse on your child.  Do not do a sinus rinse if you have had ear or nasal surgery, ear infection, or blocked ears. HOW DO I DO A SINUS RINSE?  Wash your hands.  Disinfect your device according to the directions provided and then dry it.  Use the solution that comes with your device or one that is sold separately in stores. Follow the mixing directions on the package.  Fill your device with the amount of saline solution as directed by the device instructions.  Stand over a sink and tilt your head sideways over the sink.  Place the spout of the device in your upper nostril (the one closer to the ceiling).  Gently pour or squeeze the saline solution into the nasal cavity. The liquid should drain to the lower nostril if you are not overly congested.  Gently blow your nose. Blowing too hard may cause ear pain.  Repeat in the other nostril.  Clean and rinse your device with clean water and then air-dry it. ARE THERE RISKS OF A SINUS RINSE?  Sinus rinse is generally very safe and effective. However, there are a few risks, which include:   A burning sensation in the sinuses. This may happen if you do not make the saline solution as directed. Make  sure to follow all directions when making the saline solution.  Infection from contaminated water. This is rare, but possible.  Nasal irritation.   This information is not intended to replace advice given to you by your health care provider. Make sure you discuss any questions you have with your health care provider.   Document Released: 06/21/2014 Document Reviewed: 06/21/2014 Elsevier Interactive Patient Education 2016 [employer].  Sinusitis, Adult Sinusitis is redness, soreness, and inflammation of the paranasal sinuses. Paranasal sinuses are air pockets within the bones of your face.  They are located beneath your eyes, in the middle of your forehead, and above your eyes. In healthy paranasal sinuses, mucus is able to drain out, and air is able to circulate through them by way of your nose. However, when your paranasal sinuses are inflamed, mucus and air can become trapped. This can allow bacteria and other germs to grow and cause infection. Sinusitis can develop quickly and last only a short time (acute) or continue over a long period (chronic). Sinusitis that lasts for more than 12 weeks is considered chronic. CAUSES Causes of sinusitis include:  Allergies.  Structural abnormalities, such as displacement of the cartilage that separates your nostrils (deviated septum), which can decrease the air flow through your nose and sinuses and affect sinus drainage.  Functional abnormalities, such as when the small hairs (cilia) that line your sinuses and help remove mucus do not work properly or are not present. SIGNS AND SYMPTOMS Symptoms of acute and chronic sinusitis are the same. The primary symptoms are pain and pressure around the affected sinuses. Other symptoms include:  Upper toothache.  Earache.  Headache.  Bad breath.  Decreased sense of smell and taste.  A cough, which worsens when you are lying flat.  Fatigue.  Fever.  Thick drainage from your nose, which often is green and may contain pus (purulent).  Swelling and warmth over the affected sinuses. DIAGNOSIS Your health care provider will perform a physical exam. During your exam, your health care provider may perform any of the following to help determine if you have acute sinusitis or chronic sinusitis:  Look in your nose for signs of abnormal growths in your nostrils (nasal polyps).  Tap over the affected sinus to check for signs of infection.  View the inside of your sinuses using an imaging device that has a light attached (endoscope). If your health care provider suspects that you have chronic  sinusitis, one or more of the following tests may be recommended:  Allergy tests.  Nasal culture. A sample of mucus is taken from your nose, sent to a lab, and screened for bacteria.  Nasal cytology. A sample of mucus is taken from your nose and examined by your health care provider to determine if your sinusitis is related to an allergy. TREATMENT Most cases of acute sinusitis are related to a viral infection and will resolve on their own within 10 days. Sometimes, medicines are prescribed to help relieve symptoms of both acute and chronic sinusitis. These may include pain medicines, decongestants, nasal steroid sprays, or saline sprays. However, for sinusitis related to a bacterial infection, your health care provider will prescribe antibiotic medicines. These are medicines that will help kill the bacteria causing the infection. Rarely, sinusitis is caused by a fungal infection. In these cases, your health care provider will prescribe antifungal medicine. For some cases of chronic sinusitis, surgery is needed. Generally, these are cases in which sinusitis recurs more than 3 times per year, despite  other treatments. HOME CARE INSTRUCTIONS  Drink plenty of water. Water helps thin the mucus so your sinuses can drain more easily.  Use a humidifier.  Inhale steam 3-4 times a day (for example, sit in the bathroom with the shower running).  Apply a warm, moist washcloth to your face 3-4 times a day, or as directed by your health care provider.  Use saline nasal sprays to help moisten and clean your sinuses.  Take medicines only as directed by your health care provider.  If you were prescribed either an antibiotic or antifungal medicine, finish it all even if you start to feel better. SEEK IMMEDIATE MEDICAL CARE IF:  You have increasing pain or severe headaches.  You have nausea, vomiting, or drowsiness.  You have swelling around your face.  You have vision problems.  You have a stiff  neck.  You have difficulty breathing.   This information is not intended to replace advice given to you by your health care provider. Make sure you discuss any questions you have with your health care provider.   Document Released: 11/24/2005 Document Revised: 12/15/2014 Document Reviewed: 12/09/2011 Elsevier Interactive Patient Education 2016 [employer]  Stye A stye is a bump on your eyelid caused by a bacterial infection. A stye can form inside the eyelid (internal stye) or outside the eyelid (external stye). An internal stye may be caused by an infected oil-producing gland inside your eyelid. An external stye may be caused by an infection at the base of your eyelash (hair follicle). Styes are very common. Anyone can get them at any age. They usually occur in just one eye, but you may have more than one in either eye.  CAUSES  The infection is almost always caused by bacteria called Staphylococcus aureus. This is a common type of bacteria that lives on your skin. RISK FACTORS You may be at higher risk for a stye if you have had one before. You may also be at higher risk if you have:  Diabetes.  Long-term illness.  Long-term eye redness.  A skin condition called seborrhea.  High fat levels in your blood (lipids). SIGNS AND SYMPTOMS  Eyelid pain is the most common symptom of a stye. Internal styes are more painful than external styes. Other signs and symptoms may include:  Painful swelling of your eyelid.  A scratchy feeling in your eye.  Tearing and redness of your eye.  Pus draining from the stye. DIAGNOSIS  Your health care provider may be able to diagnose a stye just by examining your eye. The health care provider may also check to make sure:  You do not have a fever or other signs of a more serious infection.  The infection has not spread to other parts of your eye or areas around your eye. TREATMENT  Most styes will clear up in a few days without treatment. In  some cases, you may need to use antibiotic drops or ointment to prevent infection. Your health care provider may have to drain the stye surgically if your stye is:  Large.  Causing a lot of pain.  Interfering with your vision. This can be done using a thin blade or a needle.  HOME CARE INSTRUCTIONS   Take medicines only as directed by your health care provider.  Apply a clean, warm compress to your eye for 10 minutes, 4 times a day.  Do not wear contact lenses or eye makeup until your stye has healed.  Do not try to pop  or drain the stye. SEEK MEDICAL CARE IF:  You have chills or a fever.  Your stye does not go away after several days.  Your stye affects your vision.  Your eyeball becomes swollen, red, or painful. MAKE SURE YOU:  Understand these instructions.  Will watch your condition.  Will get help right away if you are not doing well or get worse.   This information is not intended to replace advice given to you by your health care provider. Make sure you discuss any questions you have with your health care provider.   Document Released: 09/03/2005 Document Revised: 12/15/2014 Document Reviewed: 03/10/2014 Elsevier Interactive Patient Education [employer]

## 2016-02-01 ENCOUNTER — Telehealth: Payer: Self-pay

## 2016-07-28 ENCOUNTER — Emergency Department
Admission: EM | Admit: 2016-07-28 | Discharge: 2016-07-28 | Disposition: A | Discharge status: Home / Self Care | Payer: Medicaid Other | Source: Home / Self Care | Attending: Family Medicine | Admitting: Family Medicine

## 2016-07-28 ENCOUNTER — Encounter: Payer: Self-pay | Admitting: Emergency Medicine

## 2016-07-28 DIAGNOSIS — J329 Chronic sinusitis, unspecified: Secondary | ICD-10-CM

## 2016-07-28 DIAGNOSIS — R519 Headache, unspecified: Secondary | ICD-10-CM

## 2016-07-28 DIAGNOSIS — R51 Headache: Secondary | ICD-10-CM

## 2016-07-28 MED ORDER — PREDNISONE 20 MG PO TABS: ORAL_TABLET | ORAL | 0 refills | Status: DC

## 2016-07-28 MED ORDER — AMOXICILLIN-POT CLAVULANATE 875-125 MG PO TABS: 1.0000 | ORAL_TABLET | Freq: Two times a day (BID) | ORAL | 0 refills | Status: DC

## 2016-07-28 NOTE — ED Provider Notes (Signed)
CSN: [id number]     Arrival date & time 07/28/16  1447 History   First MD Initiated Contact with Patient 07/28/16 1507     Chief Complaint  Patient presents with  . Sinusitis   (Consider location/radiation/quality/duration/timing/severity/associated sxs/prior Treatment) HPI  Ovida B Willette ClusterYombor is a 50 y.o. female presenting to UC with c/o intermittent sinus congestion and sinus pain since June but states she has had a more persistent, and severe frontal sinus headache. Pain is worse when leaning her head over. She uses Flonase daily and has been taking Zyrtec, using Netti pot and acetaminophen and ibuprofen but only minimal relief. She notes other members of her family including her parents have had sinus infections this summer.  Denies fever, chills, n/v/d. She has seen ENT in the past, but not recently.  She does not currently have a PCP.    Past Medical History:  Diagnosis Date  . Fibromyalgia   . Heart defect   . Hypertension   . Insomnia   . Migraine   . Scoliosis    Past Surgical History:  Procedure Laterality Date  . ANKLE SURGERY    . APPENDECTOMY    . BACK SURGERY    . CARDIAC SURGERY    . CHOLECYSTECTOMY    . migraines     Family History  Problem Relation Age of Onset  . Heart disease Father   . Diabetes Father   . Kidney disease Father   . Stroke Father    Social History  Substance Use Topics  . Smoking status: Never Smoker  . Smokeless tobacco: Never Used  . Alcohol use No   OB History    No data available     Review of Systems  Constitutional: Negative for chills and fever.  HENT: Positive for congestion, rhinorrhea, sinus pressure, sneezing and sore throat. Negative for ear pain, tinnitus, trouble swallowing and voice change.   Respiratory: Negative for choking and shortness of breath.   Gastrointestinal: Negative for abdominal pain, diarrhea and nausea.  Neurological: Positive for headaches. Negative for dizziness and light-headedness.    Allergies   Review of patient's allergies indicates no known allergies.  Home Medications   Prior to Admission medications   Medication Sig Start Date End Date Taking? Authorizing Provider  amoxicillin-clavulanate (AUGMENTIN) 875-125 MG tablet Take 1 tablet by mouth 2 (two) times daily. One po bid x 7 days 07/28/16   Junius FinnerErin O'Malley, PA-C  Beclomethasone Diprop, Nasal, (QNASL NA) Place into the nose.    Historical Provider, MD  carisoprodol (SOMA) 350 MG tablet Take 350 mg by mouth 4 (four) times daily as needed.      Historical Provider, MD  eletriptan (RELPAX) 20 MG tablet One tablet by mouth as needed for migraine headache.  If the headache improves and then returns, dose may be repeated after 2 hours have elapsed since first dose (do not exceed 80 mg per day). may repeat in 2 hours if necessary     Historical Provider, MD  fluticasone (FLONASE) 50 MCG/ACT nasal spray Place into both nostrils daily.    Historical Provider, MD  mometasone (NASONEX) 50 MCG/ACT nasal spray Place into the nose daily.      Historical Provider, MD  montelukast (SINGULAIR) 10 MG tablet Take 10 mg by mouth at bedtime.      Historical Provider, MD  naratriptan (AMERGE) 2.5 MG tablet Take 2.5 mg by mouth as needed. Take one (1) tablet at onset of headache; if returns or does not resolve, may  repeat after 4 hours; do not exceed five (5) mg in 24 hours.     Historical Provider, MD  predniSONE (DELTASONE) 20 MG tablet 3 tabs po day one, then 2 po daily x 4 days 07/28/16   Junius FinnerErin O'Malley, PA-C  trimethoprim-polymyxin b (POLYTRIM) ophthalmic solution Place 1 drop into the left eye every 4 (four) hours. For 5-7 days 01/28/16   Junius FinnerErin O'Malley, PA-C  zolpidem (AMBIEN CR) 12.5 MG CR tablet Take 12.5 mg by mouth at bedtime as needed.      Historical Provider, MD  zonisamide (ZONEGRAN) 50 MG capsule Take 50 mg by mouth daily.    Historical Provider, MD   Meds Ordered and Administered this Visit  Medications - No data to display  BP 128/89 (BP  Location: Left Arm)   Pulse 81   Temp 98.2 F (36.8 C) (Oral)   Ht 5\' 5"  (1.651 m)   Wt 145 lb (65.8 kg)   LMP 07/25/2016   SpO2 98%   BMI 24.13 kg/m  No data found.   Physical Exam  Constitutional: She is oriented to person, place, and time. She appears well-developed and well-nourished. No distress.  HENT:  Head: Normocephalic and atraumatic.  Right Ear: Tympanic membrane normal.  Left Ear: Tympanic membrane normal.  Nose: Mucosal edema present. Right sinus exhibits maxillary sinus tenderness and frontal sinus tenderness. Left sinus exhibits maxillary sinus tenderness and frontal sinus tenderness.  Mouth/Throat: Uvula is midline, oropharynx is clear and moist and mucous membranes are normal.  Eyes: EOM are normal.  Neck: Normal range of motion. Neck supple.  Cardiovascular: Normal rate and regular rhythm.   Pulmonary/Chest: Effort normal and breath sounds normal. No respiratory distress. She has no wheezes. She has no rales.  Musculoskeletal: Normal range of motion.  Neurological: She is alert and oriented to person, place, and time.  Skin: Skin is warm and dry. She is not diaphoretic.  Psychiatric: She has a normal mood and affect. Her behavior is normal.  Nursing note and vitals reviewed.   Urgent Care Course   Clinical Course    Procedures (including critical care time)  Labs Review Labs Reviewed - No data to display  Imaging Review No results found.    MDM   1. Recurrent sinusitis   2. Sinus headache    Pt c/o worsening sinus congestion, pain and pressure that has been intermittent since June.  Sinus tenderness noted on exam.  Due to worsening symptoms, sinus tenderness and pain with leaning her head forward, will cover for bacterial cause.  Rx: prednisone and augmentin  Encouraged to establish care with a primary care provider for ongoing healthcare needs and f/u with ENT if she continues to have sinus headache and congestion. Patient verbalized  understanding and agreement with treatment plan.    Junius FinnerErin O'Malley, PA-C 07/28/16 1559

## 2016-07-28 NOTE — ED Triage Notes (Signed)
Sinus headache, pressure, runny nose, left eye was matted this morning, says she's been having this problem since the end of June its gotten worse in the last week.

## 2016-10-23 ENCOUNTER — Encounter: Payer: Self-pay | Admitting: Emergency Medicine

## 2016-10-23 ENCOUNTER — Emergency Department
Admission: EM | Admit: 2016-10-23 | Discharge: 2016-10-23 | Disposition: A | Discharge status: Home / Self Care | Payer: Medicaid Other | Source: Home / Self Care | Attending: Family Medicine | Admitting: Family Medicine

## 2016-10-23 DIAGNOSIS — J029 Acute pharyngitis, unspecified: Secondary | ICD-10-CM | POA: Diagnosis not present

## 2016-10-23 DIAGNOSIS — B9789 Other viral agents as the cause of diseases classified elsewhere: Secondary | ICD-10-CM

## 2016-10-23 DIAGNOSIS — J069 Acute upper respiratory infection, unspecified: Secondary | ICD-10-CM

## 2016-10-23 LAB — POCT RAPID STREP A (OFFICE): RAPID STREP A SCREEN: NEGATIVE

## 2016-10-23 MED ORDER — GUAIFENESIN-CODEINE 100-10 MG/5ML PO SOLN: ORAL | 0 refills | Status: DC

## 2016-10-23 MED ORDER — AMOXICILLIN 875 MG PO TABS: 875.0000 mg | ORAL_TABLET | Freq: Two times a day (BID) | ORAL | 0 refills | Status: DC

## 2016-10-23 MED ORDER — PREDNISONE 20 MG PO TABS: ORAL_TABLET | ORAL | 0 refills | Status: DC

## 2016-10-23 NOTE — ED Provider Notes (Signed)
[facility]    CSN: [id number] Arrival date & time: 10/23/16  1502     History   Chief Complaint Chief Complaint  Patient presents with  . Sore Throat    HPI Courtney Haynes is a 50 y.o. female.   Patient complains of 1.5 day history of typical cold-like symptoms including mild sore throat, sinus congestion, headache, fatigue, fever to 101, and cough.  She has developed intermittent wheezing and shortness of breath with activity. She has a history of seasonal rhinitis, and a past history of pneumonia. She has a family history of asthma (mother).   The history is provided by the patient.    Past Medical History:  Diagnosis Date  . Fibromyalgia   . Heart defect   . Hypertension   . Insomnia   . Migraine   . Scoliosis     There are no active problems to display for this patient.   Past Surgical History:  Procedure Laterality Date  . ANKLE SURGERY    . APPENDECTOMY    . BACK SURGERY    . CARDIAC SURGERY    . CHOLECYSTECTOMY    . migraines      OB History    No data available       Home Medications    Prior to Admission medications   Medication Sig Start Date End Date Taking? Authorizing Provider  amoxicillin (AMOXIL) 875 MG tablet Take 1 tablet (875 mg total) by mouth 2 (two) times daily. 10/23/16   Lattie HawStephen A Beese, MD  amoxicillin-clavulanate (AUGMENTIN) 875-125 MG tablet Take 1 tablet by mouth 2 (two) times daily. One po bid x 7 days 07/28/16   Junius FinnerErin O'Malley, PA-C  Beclomethasone Diprop, Nasal, (QNASL NA) Place into the nose.    Historical Provider, MD  carisoprodol (SOMA) 350 MG tablet Take 350 mg by mouth 4 (four) times daily as needed.      Historical Provider, MD  eletriptan (RELPAX) 20 MG tablet One tablet by mouth as needed for migraine headache.  If the headache improves and then returns, dose may be repeated after 2 hours have elapsed since first dose (do not exceed 80 mg per day). may repeat in 2 hours if necessary     Historical  Provider, MD  fluticasone (FLONASE) 50 MCG/ACT nasal spray Place into both nostrils daily.    Historical Provider, MD  guaiFENesin-codeine 100-10 MG/5ML syrup Take 10mL by mouth at bedtime as needed for cough 10/23/16   Lattie HawStephen A Beese, MD  mometasone (NASONEX) 50 MCG/ACT nasal spray Place into the nose daily.      Historical Provider, MD  montelukast (SINGULAIR) 10 MG tablet Take 10 mg by mouth at bedtime.      Historical Provider, MD  naratriptan (AMERGE) 2.5 MG tablet Take 2.5 mg by mouth as needed. Take one (1) tablet at onset of headache; if returns or does not resolve, may repeat after 4 hours; do not exceed five (5) mg in 24 hours.     Historical Provider, MD  predniSONE (DELTASONE) 20 MG tablet Take one tab by mouth twice daily for 5 days, then one daily. Take with food. 10/23/16   Lattie HawStephen A Beese, MD  trimethoprim-polymyxin b (POLYTRIM) ophthalmic solution Place 1 drop into the left eye every 4 (four) hours. For 5-7 days 01/28/16   Junius FinnerErin O'Malley, PA-C  zolpidem (AMBIEN CR) 12.5 MG CR tablet Take 12.5 mg by mouth at bedtime as needed.      Historical Provider, MD  zonisamide (  ZONEGRAN) 50 MG capsule Take 50 mg by mouth daily.    Historical Provider, MD    Family History Family History  Problem Relation Age of Onset  . Heart disease Father   . Diabetes Father   . Kidney disease Father   . Stroke Father     Social History Social History  Substance Use Topics  . Smoking status: Never Smoker  . Smokeless tobacco: Never Used  . Alcohol use No     Allergies   Patient has no known allergies.   Review of Systems Review of Systems + sore throat + cough No pleuritic pain + wheezing + nasal congestion + post-nasal drainage + sinus pain/pressure No itchy/red eyes No earache No hemoptysis + SOB + fever, + chills + nausea + vomiting No abdominal pain No diarrhea No urinary symptoms No skin rash + fatigue + myalgias + headache Used OTC meds without relief   Physical  Exam Triage Vital Signs ED Triage Vitals  Enc Vitals Group     BP 10/23/16 1534 130/92     Pulse Rate 10/23/16 1534 116     Resp --      Temp 10/23/16 1534 98.9 F (37.2 C)     Temp Source 10/23/16 1534 Oral     SpO2 10/23/16 1534 97 %     Weight 10/23/16 1534 150 lb (68 kg)     Height 10/23/16 1534 5\' 5"  (1.651 m)     Head Circumference --      Peak Flow --      Pain Score 10/23/16 1538 5     Pain Loc --      Pain Edu? --      Excl. in GC? --    No data found.   Updated Vital Signs BP 130/92 (BP Location: Left Arm)   Pulse 116   Temp 98.9 F (37.2 C) (Oral)   Ht 5\' 5"  (1.651 m)   Wt 150 lb (68 kg)   LMP 10/15/2016   SpO2 97%   BMI 24.96 kg/m   Visual Acuity Right Eye Distance:   Left Eye Distance:   Bilateral Distance:    Right Eye Near:   Left Eye Near:    Bilateral Near:     Physical Exam Nursing notes and Vital Signs reviewed. Appearance:  Patient appears stated age, and in no acute distress Eyes:  Pupils are equal, round, and reactive to light and accomodation.  Extraocular movement is intact.  Conjunctivae are not inflamed  Ears:  Canals normal.  Tympanic membranes normal.  Nose:  Congested turbinates.  No sinus tenderness.   Pharynx:  Uvula erythematous. Neck:  Supple.  Tender enlarged posterior/lateral nodes are palpated bilaterally  Lungs:  Clear to auscultation.  Breath sounds are equal.  Moving air well. Chest:  Distinct tenderness to palpation over the mid-sternum.  Heart:  Regular rate and rhythm without murmurs, rubs, or gallops.  Abdomen:  Nontender without masses or hepatosplenomegaly.  Bowel sounds are present.  No CVA or flank tenderness.  Extremities:  No edema.  Skin:  No rash present.    UC Treatments / Results  Labs (all labs ordered are listed, but only abnormal results are displayed) Labs Reviewed  POCT RAPID STREP A (OFFICE) negative    EKG  EKG Interpretation None       Radiology No results  found.  Procedures Procedures (including critical care time)  Medications Ordered in UC Medications - No data to display   Initial Impression /  Assessment and Plan / UC Course  I have reviewed the triage vital signs and the nursing notes.  Pertinent labs & imaging results that were available during my care of the patient were reviewed by me and considered in my medical decision making (see chart for details).  Clinical Course   With patient's history of seasonal rhinitis, family history of asthma, and past history of viral URI's that linger, she probably has a predisposition to mild reactive airways disease.  Begin prednisone burst/taper and amoxicillin. Rx for Robitussin AC for night time cough.  Take plain guaifenesin (1200mg  extended release tabs such as Mucinex) twice daily, with plenty of water, for cough and congestion.  Get adequate rest.   May use Afrin nasal spray (or generic oxymetazoline) twice daily for about 5 days and then discontinue.  Also recommend using saline nasal spray several times daily and saline nasal irrigation (AYR is a common brand).  Use Flonase nasal spray each morning after using Afrin nasal spray and saline nasal irrigation. Try warm salt water gargles for sore throat.  Stop all antihistamines for now, and other non-prescription cough/cold preparations.   Follow-up with family doctor if not improving about10 days.      Final Clinical Impressions(s) / UC Diagnoses   Final diagnoses:  Pharyngitis, unspecified etiology  Viral URI with cough    New Prescriptions New Prescriptions   AMOXICILLIN (AMOXIL) 875 MG TABLET    Take 1 tablet (875 mg total) by mouth 2 (two) times daily.   GUAIFENESIN-CODEINE 100-10 MG/5ML SYRUP    Take 10mL by mouth at bedtime as needed for cough   PREDNISONE (DELTASONE) 20 MG TABLET    Take one tab by mouth twice daily for 5 days, then one daily. Take with food.     Lattie HawStephen A Beese, MD 10/30/16 2206

## 2016-10-23 NOTE — ED Triage Notes (Signed)
Sore throat, Thick yellow mucus, congestion, headache, pressure in face and upper jaws, fever, chills, vomited twice yesterday 1.5 days

## 2016-10-23 NOTE — Discharge Instructions (Signed)
Take plain guaifenesin (1200mg extended release tabs such as Mucinex) twice daily, with plenty of water, for cough and congestion.  Get adequate rest.   °May use Afrin nasal spray (or generic oxymetazoline) twice daily for about 5 days and then discontinue.  Also recommend using saline nasal spray several times daily and saline nasal irrigation (AYR is a common brand).  Use Flonase nasal spray each morning after using Afrin nasal spray and saline nasal irrigation. °Try warm salt water gargles for sore throat.  °Stop all antihistamines for now, and other non-prescription cough/cold preparations. °  °Follow-up with family doctor if not improving about10 days.  °

## 2017-06-16 DIAGNOSIS — M797 Fibromyalgia: Secondary | ICD-10-CM | POA: Diagnosis not present

## 2017-06-16 DIAGNOSIS — G43719 Chronic migraine without aura, intractable, without status migrainosus: Secondary | ICD-10-CM | POA: Diagnosis not present

## 2017-10-16 DIAGNOSIS — J0140 Acute pansinusitis, unspecified: Secondary | ICD-10-CM | POA: Diagnosis not present

## 2017-10-16 DIAGNOSIS — R05 Cough: Secondary | ICD-10-CM | POA: Diagnosis not present

## 2018-06-24 DIAGNOSIS — R0789 Other chest pain: Secondary | ICD-10-CM | POA: Diagnosis not present

## 2018-06-24 DIAGNOSIS — M797 Fibromyalgia: Secondary | ICD-10-CM | POA: Diagnosis not present

## 2018-06-24 DIAGNOSIS — G43719 Chronic migraine without aura, intractable, without status migrainosus: Secondary | ICD-10-CM | POA: Diagnosis not present

## 2018-11-11 ENCOUNTER — Encounter (HOSPITAL_COMMUNITY): Payer: Self-pay

## 2018-11-11 ENCOUNTER — Emergency Department (HOSPITAL_COMMUNITY)
Admission: EM | Admit: 2018-11-11 | Discharge: 2018-11-11 | Disposition: A | Discharge status: Home / Self Care | Payer: Medicare Other | Attending: Emergency Medicine | Admitting: Emergency Medicine

## 2018-11-11 ENCOUNTER — Emergency Department (HOSPITAL_COMMUNITY): Payer: Medicare Other

## 2018-11-11 DIAGNOSIS — H53149 Visual discomfort, unspecified: Secondary | ICD-10-CM | POA: Diagnosis not present

## 2018-11-11 DIAGNOSIS — G4489 Other headache syndrome: Secondary | ICD-10-CM | POA: Diagnosis not present

## 2018-11-11 DIAGNOSIS — I10 Essential (primary) hypertension: Secondary | ICD-10-CM | POA: Insufficient documentation

## 2018-11-11 DIAGNOSIS — R51 Headache: Secondary | ICD-10-CM | POA: Diagnosis not present

## 2018-11-11 DIAGNOSIS — R030 Elevated blood-pressure reading, without diagnosis of hypertension: Secondary | ICD-10-CM

## 2018-11-11 DIAGNOSIS — R519 Headache, unspecified: Secondary | ICD-10-CM

## 2018-11-11 DIAGNOSIS — R000 Tachycardia, unspecified: Secondary | ICD-10-CM | POA: Diagnosis not present

## 2018-11-11 DIAGNOSIS — R110 Nausea: Secondary | ICD-10-CM | POA: Diagnosis not present

## 2018-11-11 DIAGNOSIS — G43909 Migraine, unspecified, not intractable, without status migrainosus: Secondary | ICD-10-CM | POA: Diagnosis not present

## 2018-11-11 DIAGNOSIS — R112 Nausea with vomiting, unspecified: Secondary | ICD-10-CM | POA: Diagnosis not present

## 2018-11-11 DIAGNOSIS — Z79899 Other long term (current) drug therapy: Secondary | ICD-10-CM | POA: Diagnosis not present

## 2018-11-11 DIAGNOSIS — R52 Pain, unspecified: Secondary | ICD-10-CM | POA: Diagnosis not present

## 2018-11-11 LAB — I-STAT CHEM 8, ED
BUN: 7 mg/dL (ref 6–20)
CALCIUM ION: 1.03 mmol/L — AB (ref 1.15–1.40)
CREATININE: 0.6 mg/dL (ref 0.44–1.00)
Chloride: 109 mmol/L (ref 98–111)
GLUCOSE: 122 mg/dL — AB (ref 70–99)
HCT: 33 % — ABNORMAL LOW (ref 36.0–46.0)
HEMOGLOBIN: 11.2 g/dL — AB (ref 12.0–15.0)
Potassium: 3.5 mmol/L (ref 3.5–5.1)
Sodium: 139 mmol/L (ref 135–145)
TCO2: 19 mmol/L — ABNORMAL LOW (ref 22–32)

## 2018-11-11 MED ORDER — DEXAMETHASONE SODIUM PHOSPHATE 10 MG/ML IJ SOLN
10.0000 mg | Freq: Once | INTRAMUSCULAR | Status: AC
  Administered 2018-11-11: 10 mg via INTRAVENOUS
  Filled 2018-11-11: qty 1

## 2018-11-11 MED ORDER — SODIUM CHLORIDE 0.9 % IV BOLUS
1000.0000 mL | Freq: Once | INTRAVENOUS | Status: AC
  Administered 2018-11-11: 1000 mL via INTRAVENOUS

## 2018-11-11 MED ORDER — PROCHLORPERAZINE EDISYLATE 10 MG/2ML IJ SOLN
10.0000 mg | Freq: Once | INTRAMUSCULAR | Status: AC
  Administered 2018-11-11: 10 mg via INTRAVENOUS
  Filled 2018-11-11: qty 2

## 2018-11-11 MED ORDER — PROMETHAZINE HCL 25 MG PO TABS: 25.0000 mg | ORAL_TABLET | Freq: Four times a day (QID) | ORAL | 0 refills | Status: DC | PRN

## 2018-11-11 MED ORDER — KETOROLAC TROMETHAMINE 30 MG/ML IJ SOLN
30.0000 mg | Freq: Once | INTRAMUSCULAR | Status: AC
  Administered 2018-11-11: 30 mg via INTRAVENOUS
  Filled 2018-11-11: qty 1

## 2018-11-11 MED ORDER — DIPHENHYDRAMINE HCL 50 MG/ML IJ SOLN
50.0000 mg | Freq: Once | INTRAMUSCULAR | Status: AC
  Administered 2018-11-11: 50 mg via INTRAVENOUS
  Filled 2018-11-11: qty 1

## 2018-11-11 NOTE — ED Notes (Signed)
Patient transported to CT 

## 2018-11-11 NOTE — Discharge Instructions (Addendum)
You were seen in the ER for headache, nausea, vomiting.  Your blood pressure was noted to be elevated.  Head CT today is normal.  Your lab work including kidney function is normal.  Your symptoms improved with medicines today and your blood pressure decreased as well.  Continue all your medications as prescribed by your neurologist.  Contact your neurologist within the next week for further discussion of your visit today, to see if there are any other interventions they recommend.  Return to the ER for worsening or new symptoms, double vision or vision loss, difficulty with speech, swallowing or walking, one-sided numbness or weakness, neck stiffness or fever.  Your blood pressure improved today with pain control.  Monitor your blood pressure at home for the next week and record your blood pressure readings.  If your blood pressure continues to be greater than 150/90, you may need blood pressure medications.  Discussed this with your primary care doctor.

## 2018-11-11 NOTE — ED Notes (Signed)
Patient verbalizes understanding of medications and discharge instructions. No further questions at this time. VSS and patient ambulatory at discharge.   

## 2018-11-11 NOTE — ED Triage Notes (Addendum)
Patient BIB EMS for headache since this morning. Patient said she woke up this with a "slight" headache but it has increased throughout the day. EMS states BP was 218/120 when they first arrived.  Patient also c/o blurry vision and pain in LEFT eye. Denies chest pain, SOB, abdominal pain, or dysuria. Reports a history of HTN but since exercising her provider took her off.   BP 170/80 HR 100

## 2018-11-11 NOTE — ED Provider Notes (Signed)
[facility] EMERGENCY DEPARTMENT Provider Note   CSN: [id number] Arrival date & time: 11/11/18  1912     History   Chief Complaint Chief Complaint  Patient presents with  . Headache    HPI Courtney Haynes is a 52 y.o. female with history of migraines, fibromyalgia, chronic opioid use is here for evaluation of headache.  At baseline she has a headache every other day in a month. She has had a headache for several days. Today noticed HA when waking up, acutely worsening, gradually since then. Described as constant, severe, throbbing located to her forehead and behind her left eye, non-radiating.  Associated with photophobia, nausea, vomiting, initial bilateral neck pain but this has resolved.  Also reports constant, bilateral blurred vision worse on the left eye.  States she typically gets this blurred vision with her headaches.  She took naratriptan today prescribed by her neurologist which did not improve the headache.  No alleviating or aggravating factors.  She denies recent head trauma or falls.  She denies anticoagulant use.  She has no double vision, dizziness, syncope, difficulty with speech, difficulty with balance, chest pain, shortness of breath, fevers, neck pain.  Denies history of hypertension.  Noted to be hypertensive by EMS 218/120.  HPI  Past Medical History:  Diagnosis Date  . Fibromyalgia   . Heart defect   . Hypertension   . Insomnia   . Migraine   . Scoliosis     There are no active problems to display for this patient.   Past Surgical History:  Procedure Laterality Date  . ANKLE SURGERY    . APPENDECTOMY    . BACK SURGERY    . CARDIAC SURGERY    . CHOLECYSTECTOMY    . migraines       OB History   None      Home Medications    Prior to Admission medications   Medication Sig Start Date End Date Taking? Authorizing Provider  carisoprodol (SOMA) 350 MG tablet Take 350 mg by mouth 4 (four) times daily as needed.     Yes  [provider]  eletriptan (RELPAX) 20 MG tablet One tablet by mouth as needed for migraine headache.  If the headache improves and then returns, dose may be repeated after 2 hours have elapsed since first dose (do not exceed 80 mg per day). may repeat in 2 hours if necessary    Yes [provider]  ibuprofen (ADVIL,MOTRIN) 200 MG tablet Take 200 mg by mouth every 6 (six) hours as needed.   Yes [provider]  Milnacipran HCl (SAVELLA) 25 MG TABS Take 25 mg by mouth 2 (two) times daily. 07/07/17  Yes [provider]  naratriptan (AMERGE) 2.5 MG tablet Take 2.5 mg by mouth as needed. Take one (1) tablet at onset of headache; if returns or does not resolve, may repeat after 4 hours; do not exceed five (5) mg in 24 hours.    Yes [provider]  zolpidem (AMBIEN CR) 12.5 MG CR tablet Take 12.5 mg by mouth at bedtime as needed for sleep.    Yes [provider]  amoxicillin (AMOXIL) 875 MG tablet Take 1 tablet (875 mg total) by mouth 2 (two) times daily. Patient not taking: Reported on 11/11/2018 10/23/16   Lattie Haw, MD  amoxicillin-clavulanate (AUGMENTIN) 875-125 MG tablet Take 1 tablet by mouth 2 (two) times daily. One po bid x 7 days Patient not taking: Reported on 11/11/2018 07/28/16  Lurene Shadow, PA-C  guaiFENesin-codeine 100-10 MG/5ML syrup Take 10mL by mouth at bedtime as needed for cough Patient not taking: Reported on 11/11/2018 10/23/16   Lattie Haw, MD  predniSONE (DELTASONE) 20 MG tablet Take one tab by mouth twice daily for 5 days, then one daily. Take with food. Patient not taking: Reported on 11/11/2018 10/23/16   Lattie Haw, MD  promethazine (PHENERGAN) 25 MG tablet Take 1 tablet (25 mg total) by mouth every 6 (six) hours as needed for nausea or vomiting. 11/11/18   Liberty Handy, PA-C  trimethoprim-polymyxin b (POLYTRIM) ophthalmic solution Place 1 drop into the left eye every 4 (four) hours. For 5-7  days Patient not taking: Reported on 11/11/2018 01/28/16   Rolla Plate    Family History Family History  Problem Relation Age of Onset  . Heart disease Father   . Diabetes Father   . Kidney disease Father   . Stroke Father     Social History Social History   Tobacco Use  . Smoking status: Never Smoker  . Smokeless tobacco: Never Used  Substance Use Topics  . Alcohol use: No  . Drug use: No     Allergies   Patient has no known allergies.   Review of Systems Review of Systems  Eyes: Positive for photophobia and visual disturbance.  Gastrointestinal: Positive for nausea and vomiting.  Neurological: Positive for headaches.  All other systems reviewed and are negative.    Physical Exam Updated Vital Signs BP (!) 151/93   Pulse 95   Temp 97.8 F (36.6 C) (Oral)   Resp 17   Ht 5\' 5"  (1.651 m)   Wt 68 kg   LMP 11/01/2018   SpO2 99%   BMI 24.96 kg/m   Physical Exam  Constitutional: She appears well-developed.  Non-toxic appearance.  NAD.  HENT:  Head: Normocephalic and atraumatic.  Right Ear: External ear normal.  Left Ear: External ear normal.  Nose: Nose normal. No mucosal edema or septal deviation.  Moist mucous membranes Uvula midline Oropharynx and tonsils normal No tenderness over temporal arteries  Eyes: Pupils are equal, round, and reactive to light. Conjunctivae, EOM and lids are normal.  Unable to visualize back of eye  Neck:  No c spine spinous process or muscular tenderness  Full PROM of neck w/o rigidity  No meningeal signs   Cardiovascular: Normal rate, regular rhythm and normal heart sounds.  Pulses:      Radial pulses are 2+ on the right side, and 2+ on the left side.       Dorsalis pedis pulses are 2+ on the right side, and 2+ on the left side.  Pulmonary/Chest: Effort normal and breath sounds normal.  Lymphadenopathy:  No cervical adenopathy  Neurological: She is alert. GCS eye subscore is 4. GCS verbal subscore is 5. GCS  motor subscore is 6.  Speech is fluent without obvious dysarthria or aphasia. Strength 5/5 in upper and lower extremities   Sensation to light touch intact in bilateral face, upper and lower extremities No truncal sway. No pronator drift. No leg drop.  Normal finger-to-nose and finger tapping.  CN II-XII grossly intact bilaterally.   Skin: Skin is warm and dry. Capillary refill takes less than 2 seconds. No rash noted.  Psychiatric: She has a normal mood and affect. Her speech is normal and behavior is normal. Judgment and thought content normal.     ED Treatments / Results  Labs (all labs ordered are  listed, but only abnormal results are displayed) Labs Reviewed  I-STAT CHEM 8, ED - Abnormal; Notable for the following components:      Result Value   Glucose, Bld 122 (*)    Calcium, Ion 1.03 (*)    TCO2 19 (*)    Hemoglobin 11.2 (*)    HCT 33.0 (*)    All other components within normal limits    EKG None  Radiology Ct Head Wo Contrast  Result Date: 11/11/2018 CLINICAL DATA:  Headache EXAM: CT HEAD WITHOUT CONTRAST TECHNIQUE: Contiguous axial images were obtained from the base of the skull through the vertex without intravenous contrast. COMPARISON:  9 FINDINGS: Brain: No acute intracranial abnormality. Specifically, no hemorrhage, hydrocephalus, mass lesion, acute infarction, or significant intracranial injury. Vascular: No hyperdense vessel or unexpected calcification. Skull: No acute calvarial abnormality. Sinuses/Orbits: Visualized paranasal sinuses and mastoids clear. Orbital soft tissues unremarkable. Other: None IMPRESSION: Normal study. Electronically Signed   By: Charlett NoseKevin  Dover M.D.   On: 11/11/2018 20:16    Procedures Procedures (including critical care time)  Medications Ordered in ED Medications  prochlorperazine (COMPAZINE) injection 10 mg (10 mg Intravenous Given 11/11/18 2001)  diphenhydrAMINE (BENADRYL) injection 50 mg (50 mg Intravenous Given 11/11/18 2001)   sodium chloride 0.9 % bolus 1,000 mL (0 mLs Intravenous Stopped 11/11/18 2133)  dexamethasone (DECADRON) injection 10 mg (10 mg Intravenous Given 11/11/18 2001)  ketorolac (TORADOL) 30 MG/ML injection 30 mg (30 mg Intravenous Given 11/11/18 2131)     Initial Impression / Assessment and Plan / ED Course  I have reviewed the triage vital signs and the nursing notes.  Pertinent labs & imaging results that were available during my care of the patient were reviewed by me and considered in my medical decision making (see chart for details).    52 year old here with headache.  Headache today is described similarly to previous headaches but more intense and refractory to home medications.  EMS noted to be hypertensive but this has improved upon arrival to ER.  I suspect acute on chronic headache today.  Given elevated blood pressure however we will obtain CT head to rule out intracranial hemorrhage.  2239: CT negative. BP improved with migraine control.  Patient is otherwise without high risk features of headache including trauma, AMS, seizure activity, neck rigidity, fever, anticoagulation.  She has no meningismus, focal neuro deficits.  Given reassuring work-up, CT, improvement of symptoms she is deemed appropriate for discharge.  Low suspicion for other intracranial vascular etiology, meningitis. Discussed s/s that would warrant return to ED. Pt verbalized understanding and agreeable with ED tx and dc plan.  In regards to her blood pressure, I recommended she monitor her blood pressure at home for the next week.  If persistently elevated, recommended follow-up with PCP.   Final Clinical Impressions(s) / ED Diagnoses   Final diagnoses:  Headache above the eye region  Elevated blood pressure reading without diagnosis of hypertension    ED Discharge Orders         Ordered    promethazine (PHENERGAN) 25 MG tablet  Every 6 hours PRN     11/11/18 2203           Liberty HandyGibbons, Iza Preston J, PA-C 11/11/18  2240    Azalia Bilisampos, Kevin, MD 11/12/18 [phone]

## 2018-11-19 ENCOUNTER — Ambulatory Visit: Payer: Medicare Other | Admitting: Family Medicine

## 2018-12-02 ENCOUNTER — Ambulatory Visit (INDEPENDENT_AMBULATORY_CARE_PROVIDER_SITE_OTHER): Payer: Medicare Other | Admitting: Family Medicine

## 2018-12-02 ENCOUNTER — Encounter: Payer: Self-pay | Admitting: Family Medicine

## 2018-12-02 ENCOUNTER — Other Ambulatory Visit: Payer: Self-pay | Admitting: Family Medicine

## 2018-12-02 VITALS — BP 168/117 | HR 112 | Temp 97.8°F | Resp 16 | Ht 64.5 in | Wt 154.0 lb

## 2018-12-02 DIAGNOSIS — I10 Essential (primary) hypertension: Secondary | ICD-10-CM

## 2018-12-02 DIAGNOSIS — Z79899 Other long term (current) drug therapy: Secondary | ICD-10-CM | POA: Diagnosis not present

## 2018-12-02 DIAGNOSIS — G43909 Migraine, unspecified, not intractable, without status migrainosus: Secondary | ICD-10-CM | POA: Diagnosis not present

## 2018-12-02 LAB — TSH: TSH: 1.62 u[IU]/mL (ref 0.35–4.50)

## 2018-12-02 MED ORDER — METOPROLOL SUCCINATE ER 50 MG PO TB24: 50.0000 mg | ORAL_TABLET | Freq: Every day | ORAL | 0 refills | Status: DC

## 2018-12-02 NOTE — Telephone Encounter (Signed)
Patient just seen in office today 12/02/18 - patient requesting 90 day supply of metoprolol.  Please advise.

## 2018-12-02 NOTE — Telephone Encounter (Signed)
OK, will eRx 90d supply.

## 2018-12-02 NOTE — Patient Instructions (Signed)
Check blood pressure and heart rate once a day and write these numbers down and bring them to next appointment to review with me.

## 2018-12-02 NOTE — Progress Notes (Signed)
Office Note 12/02/2018  CC:  Chief Complaint  Patient presents with  . Establish Care    MIgraine x3-4, really bad  morning    HPI:  Courtney Haynes is a 52 y.o. female who is here accompanied by her mother today to establish care, wants to discuss migraines and HTN. Patient's most recent primary MD: none Old records in EPIC/[facility] EMR were reviewed prior to or during today's visit.  Migraines: prophylaxis-->zonegran 200 mg qd. relpax and naratriptan for abortive meds. Last visit with neurologist was approx 07/2018, no changes were made at that visit per pt report.  I don't have any records at this time.  She was on immunoglobulin injection in the past and this was of no help. Days per week with a HA: almost daily lately. She is followed by Dr[facility] Kelli HopeMichael Reynolds[facility], but pt states she only sees him approx q6 mo.    HTN: "on and off" for a long time.  Recalls being on hctz in "many years ago" and this caused hypotension but she has very little detailed recollection of any other attempts at bp monitoring or treatment..  She checks bp at home some lately and it has been averaging 170s, 90s diastolic, HR >100.  Tough to tell if HA's causing inc bp or other way round. Recent trip to local ED for HAs/BP up, CT head neg acute.  No bp med started at that time.  I asked pt directly if I would find any street drugs or any prescription meds other than the ones she is prescribed in a urine drug screen today.  She stated no. Of note, she did not request any controlled substance rx today.  Past Medical History:  Diagnosis Date  . Fibromyalgia   . Heart defect   . Hypertension   . Insomnia   . Migraine   . Scoliosis     Past Surgical History:  Procedure Laterality Date  . APPENDECTOMY  6th grade  . BACK SURGERY  remote past   discectomy x 1, then discectomy with titanium fusion  . CARDIAC SURGERY     "hole" closure  . CHOLECYSTECTOMY  prior to 2010  . FOOT SURGERY   approx 1990   benign mass removed from bottom of foot  . migraines      Family History  Problem Relation Age of Onset  . Heart disease Father   . Diabetes Father   . Kidney disease Father   . Stroke Father     Social History   Socioeconomic History  . Marital status: Married    Spouse name: Not on file  . Number of children: Not on file  . Years of education: Not on file  . Highest education level: Not on file  Occupational History  . Not on file  Social Needs  . Financial resource strain: Not on file  . Food insecurity:    Worry: Not on file    Inability: Not on file  . Transportation needs:    Medical: Not on file    Non-medical: Not on file  Tobacco Use  . Smoking status: Never Smoker  . Smokeless tobacco: Never Used  Substance and Sexual Activity  . Alcohol use: No  . Drug use: No  . Sexual activity: Not on file  Lifestyle  . Physical activity:    Days per week: Not on file    Minutes per session: Not on file  . Stress: Not on file  Relationships  .  Social connections:    Talks on phone: Not on file    Gets together: Not on file    Attends religious service: Not on file    Active member of club or organization: Not on file    Attends meetings of clubs or organizations: Not on file    Relationship status: Not on file  . Intimate partner violence:    Fear of current or ex partner: Not on file    Emotionally abused: Not on file    Physically abused: Not on file    Forced sexual activity: Not on file  Other Topics Concern  . Not on file  Social History Narrative  . Not on file    Outpatient Encounter Medications as of 12/02/2018  Medication Sig  . carisoprodol (SOMA) 350 MG tablet Take 350 mg by mouth 4 (four) times daily as needed.    . cetirizine (ZYRTEC) 10 MG tablet Take 10 mg by mouth daily.  Marland Kitchen eletriptan (RELPAX) 20 MG tablet One tablet by mouth as needed for migraine headache.  If the headache improves and then returns, dose may be repeated  after 2 hours have elapsed since first dose (do not exceed 80 mg per day). may repeat in 2 hours if necessary   . fluticasone (FLONASE) 50 MCG/ACT nasal spray Place into both nostrils daily.  Marland Kitchen ibuprofen (ADVIL,MOTRIN) 200 MG tablet Take 200 mg by mouth every 6 (six) hours as needed.  . magnesium gluconate (MAGONATE) 500 MG tablet Take 500 mg by mouth 2 (two) times daily.  . Melatonin 5 MG TABS Take by mouth.  . Milnacipran HCl (SAVELLA) 25 MG TABS Take 25 mg by mouth 2 (two) times daily.  . promethazine (PHENERGAN) 25 MG tablet Take 1 tablet (25 mg total) by mouth every 6 (six) hours as needed for nausea or vomiting.  Marland Kitchen zolpidem (AMBIEN CR) 12.5 MG CR tablet Take 12.5 mg by mouth at bedtime as needed for sleep.   Marland Kitchen zonisamide (ZONEGRAN) 100 MG capsule Take 100 mg by mouth daily.  . [DISCONTINUED] amoxicillin (AMOXIL) 875 MG tablet Take 1 tablet (875 mg total) by mouth 2 (two) times daily. (Patient not taking: Reported on 11/11/2018)  . [DISCONTINUED] amoxicillin-clavulanate (AUGMENTIN) 875-125 MG tablet Take 1 tablet by mouth 2 (two) times daily. One po bid x 7 days (Patient not taking: Reported on 11/11/2018)  . [DISCONTINUED] guaiFENesin-codeine 100-10 MG/5ML syrup Take 10mL by mouth at bedtime as needed for cough (Patient not taking: Reported on 11/11/2018)  . [DISCONTINUED] metoprolol succinate (TOPROL-XL) 50 MG 24 hr tablet Take 1 tablet (50 mg total) by mouth daily. Take with or immediately following a meal.  . [DISCONTINUED] naratriptan (AMERGE) 2.5 MG tablet Take 2.5 mg by mouth as needed. Take one (1) tablet at onset of headache; if returns or does not resolve, may repeat after 4 hours; do not exceed five (5) mg in 24 hours.   . [DISCONTINUED] predniSONE (DELTASONE) 20 MG tablet Take one tab by mouth twice daily for 5 days, then one daily. Take with food. (Patient not taking: Reported on 11/11/2018)  . [DISCONTINUED] trimethoprim-polymyxin b (POLYTRIM) ophthalmic solution Place 1 drop into the  left eye every 4 (four) hours. For 5-7 days (Patient not taking: Reported on 11/11/2018)   No facility-administered encounter medications on file as of 12/02/2018.     No Known Allergies  ROS Review of Systems  Constitutional: Negative for fatigue and fever.  HENT: Negative for congestion and sore throat.   Eyes: Negative  for visual disturbance.  Respiratory: Negative for cough, shortness of breath and wheezing.   Cardiovascular: Negative for chest pain, palpitations and leg swelling.  Gastrointestinal: Negative for abdominal pain, blood in stool and nausea.  Genitourinary: Negative for dysuria.       Menses are irregular and bleeding lasts about 10d--pt feels she is perimenopausal  Musculoskeletal: Positive for myalgias (chronic). Negative for back pain and joint swelling.  Skin: Negative for rash.  Neurological: Positive for headaches. Negative for dizziness, tremors, seizures, facial asymmetry, weakness and light-headedness.  Hematological: Negative for adenopathy.  Psychiatric/Behavioral: Positive for dysphoric mood ("b/c of my HA's") and sleep disturbance.    PE; Blood pressure (!) 168/117, pulse (!) 112, temperature 97.8 F (36.6 C), temperature source Oral, resp. rate 16, height 5' 4.5" (1.638 m), weight 154 lb (69.9 kg), SpO2 98 %.Body mass index is 26.03 kg/m.  Gen: Alert, well appearing.  Patient is oriented to person, place, time, and situation. UJW:JXBJENT:Eyes: no injection, icteris, swelling, or exudate.  EOMI, Pupils are very large, symmetric, and minimally responsive to light.  Optic discs with clear margins, retinal vessels normal-appearing. Mouth: lips without lesion/swelling.  Oral mucosa pink and moist. Oropharynx without erythema, exudate, or swelling.  Neck - No masses or thyromegaly or limitation in range of motion CV: Regular, tachycardic to 120s, no m/r/g.   LUNGS: CTA bilat, nonlabored resps, good aeration in all lung fields. ABD: soft, NT, ND, BS normal.  No  hepatospenomegaly or mass.  No bruits. EXT: no clubbing or cyanosis.  no edema.  Skin - no sores or suspicious lesions or rashes or color changes   Pertinent labs:    Chemistry      Component Value Date/Time   NA 139 11/11/2018 2139   K 3.5 11/11/2018 2139   CL 109 11/11/2018 2139   BUN 7 11/11/2018 2139   CREATININE 0.60 11/11/2018 2139      Component Value Date/Time   ALKPHOS 57 02/24/2008 0850   AST 17 02/24/2008 0850   ALT 28 02/24/2008 0850   BILITOT 0.5 02/24/2008 0850     Lab Results  Component Value Date   WBC 5.9 10/09/2008   HGB 11.2 (L) 11/11/2018   HCT 33.0 (L) 11/11/2018   MCV 93.1 10/09/2008   PLT 315 10/09/2008   Lab Results  Component Value Date   TSH 1.62 12/02/2018     ASSESSMENT AND PLAN:   1) HTN; sounds like this has been longstanding but not actively treated. I think it is likely causing her migraines to be uncontrolled. Will start slowly to get it down: toprol xl 50mg  qd to start. TSH, UDS today. Recent BMET and CBC in ED were fine.  2) Migraine syndrome: complicated by uncontrolled HTN lately. CT imaging of brain unremarkable in ED recently. I see no MRI brain in the EMR. Pupils quite large today--stimulant use/abuse with UDS today. I did not change her regimen for HA treatment today. I am going to strongly encourage her to establish a more frequent f/u schedule with her neurologist.  Will review Dr. Ferne Coeeynold's records in EMR in more detail prior to her next f/u with me.  An After Visit Summary was printed and given to the patient.  Return for 7-10d f/u uncont HTN, possibly discuss HAs.  Signed:  Santiago BumpersPhil Tradarius Reinwald, MD           12/02/2018

## 2018-12-04 LAB — PAIN MGMT, PROFILE 8 W/CONF, U
6 Acetylmorphine: NEGATIVE ng/mL (ref <10)
ALCOHOL METABOLITES: NEGATIVE ng/mL (ref <500)
Amphetamines: NEGATIVE ng/mL (ref <500)
Benzodiazepines: NEGATIVE ng/mL (ref <100)
Buprenorphine, Urine: NEGATIVE ng/mL (ref <5)
Cocaine Metabolite: NEGATIVE ng/mL (ref <150)
Creatinine: 72.4 mg/dL
MDMA: NEGATIVE ng/mL (ref <500)
Marijuana Metabolite: NEGATIVE ng/mL (ref <20)
OPIATES: NEGATIVE ng/mL (ref <100)
Oxidant: NEGATIVE ug/mL (ref <200)
Oxycodone: NEGATIVE ng/mL (ref <100)
pH: 7.45 (ref 4.5–9.0)

## 2018-12-09 ENCOUNTER — Ambulatory Visit (INDEPENDENT_AMBULATORY_CARE_PROVIDER_SITE_OTHER): Payer: Medicare HMO | Admitting: Family Medicine

## 2018-12-09 ENCOUNTER — Encounter: Payer: Self-pay | Admitting: Family Medicine

## 2018-12-09 VITALS — BP 155/82 | HR 95 | Temp 97.7°F | Resp 16 | Ht 64.5 in | Wt 154.2 lb

## 2018-12-09 DIAGNOSIS — R0609 Other forms of dyspnea: Secondary | ICD-10-CM

## 2018-12-09 DIAGNOSIS — G43909 Migraine, unspecified, not intractable, without status migrainosus: Secondary | ICD-10-CM

## 2018-12-09 DIAGNOSIS — I10 Essential (primary) hypertension: Secondary | ICD-10-CM | POA: Diagnosis not present

## 2018-12-09 DIAGNOSIS — E663 Overweight: Secondary | ICD-10-CM

## 2018-12-09 MED ORDER — METOPROLOL SUCCINATE ER 100 MG PO TB24: 100.0000 mg | ORAL_TABLET | Freq: Every day | ORAL | 0 refills | Status: DC

## 2018-12-09 NOTE — Progress Notes (Signed)
OFFICE VISIT  12/09/2018   CC:  Chief Complaint  Patient presents with  . Follow-up    HTN, pt is not fasting.    HPI:    Patient is a 53 y.o. Caucasian female who presents accompanied by her mother for 1 week f/u HTN. I started her on toprol xl 50mg  qd at last visit----she was on no bp med at that time. The uncontrolled HTN is making her migraine HA syndrome worse.  Feeling improved: bp's down to 140/90 avg, HR 80s-90s. HA's improved--not as intensely throbbing in nature.  No side effects from metoprolol.  New c/o: signif feeling of SOB x 6 mo, can be at rest some, but more of a problem when she does activity.  Things like walking around grocery store or around home make her more SOB, and it is exac even worse by things like going up/down stairs.  No CP assoc with this SOB/DOE. Some more dizziness.  + Coughing during the SOB, although this question was very hard for the patient to answer with any real definity.  Has heard a wheeze during these times. No palpitations. Remote hx of tobacco--she quit about 25 yrs ago. She was dx'd with asthma as an adult but inhalers too expensive and she felt like she didn't need them after a while. She can't be clear on what she means by this, says she doesn't recall if she was ever on a controller med. She has inhalers at home "somewhere".  Past Medical History:  Diagnosis Date  . Allergic rhinitis   . Fibromyalgia   . Heart defect    ? PFO ("hole in heart")--pt states Dr. Jacinto Halim is her cardiologist.  . Hypertension   . Insomnia   . Migraine syndrome   . Scoliosis     Past Surgical History:  Procedure Laterality Date  . APPENDECTOMY  6th grade  . BACK SURGERY  remote past   discectomy x 1, then discectomy with titanium fusion  . CARDIAC SURGERY     "hole" closure  . CHOLECYSTECTOMY  prior to 2010  . COLONOSCOPY     "1990s" --reason unknown  . FOOT SURGERY  approx 1990   benign mass removed from bottom of foot    Outpatient  Medications Prior to Visit  Medication Sig Dispense Refill  . carisoprodol (SOMA) 350 MG tablet Take 350 mg by mouth 4 (four) times daily as needed.      . cetirizine (ZYRTEC) 10 MG tablet Take 10 mg by mouth daily.    Marland Kitchen eletriptan (RELPAX) 20 MG tablet One tablet by mouth as needed for migraine headache.  If the headache improves and then returns, dose may be repeated after 2 hours have elapsed since first dose (do not exceed 80 mg per day). may repeat in 2 hours if necessary     . fluticasone (FLONASE) 50 MCG/ACT nasal spray Place into both nostrils daily.    Marland Kitchen ibuprofen (ADVIL,MOTRIN) 200 MG tablet Take 200 mg by mouth every 6 (six) hours as needed.    . magnesium gluconate (MAGONATE) 500 MG tablet Take 500 mg by mouth 2 (two) times daily.    . Melatonin 5 MG TABS Take by mouth.    . Milnacipran HCl (SAVELLA) 25 MG TABS Take 25 mg by mouth 2 (two) times daily.    . naratriptan (AMERGE) 2.5 MG tablet Take 2.5 mg by mouth as needed for migraine. Take one (1) tablet at onset of headache; if returns or does not resolve, may repeat  after 4 hours; do not exceed five (5) mg in 24 hours.    . promethazine (PHENERGAN) 25 MG tablet Take 1 tablet (25 mg total) by mouth every 6 (six) hours as needed for nausea or vomiting. 30 tablet 0  . zolpidem (AMBIEN CR) 12.5 MG CR tablet Take 12.5 mg by mouth at bedtime as needed for sleep.     Marland Kitchen zonisamide (ZONEGRAN) 100 MG capsule Take 100 mg by mouth daily.    . metoprolol succinate (TOPROL-XL) 50 MG 24 hr tablet TAKE 1 TABLET BY MOUTH DAILY. TAKE WITH OR IMMEDIATELY FOLLOWING A MEAL 90 tablet 0   No facility-administered medications prior to visit.     No Known Allergies  ROS As per HPI  PE: Blood pressure (!) 155/82, pulse 95, temperature 97.7 F (36.5 C), temperature source Oral, resp. rate 16, height 5' 4.5" (1.638 m), weight 154 lb 4 oz (70 kg), last menstrual period 11/21/2018, SpO2 96 %. Gen: Alert, well appearing.  Patient is oriented to person,  place, time, and situation. AFFECT: pleasant, lucid thought and speech. WLK:HVFM: no injection, icteris, swelling, or exudate.  EOMI, PERRLA. (pupils not overly dilated like last exam) Mouth: lips without lesion/swelling.  Oral mucosa pink and moist. Oropharynx without erythema, exudate, or swelling.  Neck - No masses or thyromegaly or limitation in range of motion CV: RRR, no m/r/g.   LUNGS: CTA bilat, nonlabored resps, good aeration in all lung fields. EXT: no clubbing or cyanosis.  no edema.    LABS:    Chemistry      Component Value Date/Time   NA 139 11/11/2018 2139   K 3.5 11/11/2018 2139   CL 109 11/11/2018 2139   BUN 7 11/11/2018 2139   CREATININE 0.60 11/11/2018 2139      Component Value Date/Time   ALKPHOS 57 02/24/2008 0850   AST 17 02/24/2008 0850   ALT 28 02/24/2008 0850   BILITOT 0.5 02/24/2008 0850      IMPRESSION AND PLAN:  1) Uncontrolled HTN: improved. Increase toprol xl to 100mg  qd.  2) Migraine HA's: improved some with better control of bp. Hopefully this will get even better as bp goes into normal range consistently.  3) DOE: unclear etiology. Suspect she may have asthma. CXR ordered today. Once bp is under control we'll get PFTs and possibly EKG and additional cardiac testing.  An After Visit Summary was printed and given to the patient.  FOLLOW UP: Return in about 2 weeks (around 12/23/2018) for f/u HTN; discuss SOB more.  Signed:  Santiago Bumpers, MD           12/09/2018

## 2018-12-22 ENCOUNTER — Ambulatory Visit: Payer: Medicare Other | Admitting: Family Medicine

## 2018-12-22 NOTE — Progress Notes (Deleted)
OFFICE VISIT  12/22/2018   CC: No chief complaint on file.    HPI:    Patient is a 53 y.o.  female who presents for 2 wk f/u uncontrolled HTN.  Past Medical History:  Diagnosis Date  . Allergic rhinitis   . Fibromyalgia   . Heart defect    ? PFO ("hole in heart")--pt states Dr. Jacinto Halim is her cardiologist.  . Hypertension   . Insomnia   . Migraine syndrome   . Scoliosis     Past Surgical History:  Procedure Laterality Date  . APPENDECTOMY  6th grade  . BACK SURGERY  remote past   discectomy x 1, then discectomy with titanium fusion  . CARDIAC SURGERY     "hole" closure  . CHOLECYSTECTOMY  prior to 2010  . COLONOSCOPY     "1990s" --reason unknown  . FOOT SURGERY  approx 1990   benign mass removed from bottom of foot    Outpatient Medications Prior to Visit  Medication Sig Dispense Refill  . carisoprodol (SOMA) 350 MG tablet Take 350 mg by mouth 4 (four) times daily as needed.      . cetirizine (ZYRTEC) 10 MG tablet Take 10 mg by mouth daily.    Marland Kitchen eletriptan (RELPAX) 20 MG tablet One tablet by mouth as needed for migraine headache.  If the headache improves and then returns, dose may be repeated after 2 hours have elapsed since first dose (do not exceed 80 mg per day). may repeat in 2 hours if necessary     . fluticasone (FLONASE) 50 MCG/ACT nasal spray Place into both nostrils daily.    Marland Kitchen ibuprofen (ADVIL,MOTRIN) 200 MG tablet Take 200 mg by mouth every 6 (six) hours as needed.    . magnesium gluconate (MAGONATE) 500 MG tablet Take 500 mg by mouth 2 (two) times daily.    . Melatonin 5 MG TABS Take by mouth.    . metoprolol succinate (TOPROL-XL) 100 MG 24 hr tablet Take 1 tablet (100 mg total) by mouth daily. Take with or immediately following a meal. 30 tablet 0  . Milnacipran HCl (SAVELLA) 25 MG TABS Take 25 mg by mouth 2 (two) times daily.    . naratriptan (AMERGE) 2.5 MG tablet Take 2.5 mg by mouth as needed for migraine. Take one (1) tablet at onset of headache; if  returns or does not resolve, may repeat after 4 hours; do not exceed five (5) mg in 24 hours.    . promethazine (PHENERGAN) 25 MG tablet Take 1 tablet (25 mg total) by mouth every 6 (six) hours as needed for nausea or vomiting. 30 tablet 0  . zolpidem (AMBIEN CR) 12.5 MG CR tablet Take 12.5 mg by mouth at bedtime as needed for sleep.     Marland Kitchen zonisamide (ZONEGRAN) 100 MG capsule Take 100 mg by mouth daily.     No facility-administered medications prior to visit.     No Known Allergies  ROS As per HPI  PE: There were no vitals taken for this visit. ***  LABS:    Chemistry      Component Value Date/Time   NA 139 11/11/2018 2139   K 3.5 11/11/2018 2139   CL 109 11/11/2018 2139   BUN 7 11/11/2018 2139   CREATININE 0.60 11/11/2018 2139      Component Value Date/Time   ALKPHOS 57 02/24/2008 0850   AST 17 02/24/2008 0850   ALT 28 02/24/2008 0850   BILITOT 0.5 02/24/2008 0850  IMPRESSION AND PLAN:  No problem-specific Assessment & Plan notes found for this encounter.   An After Visit Summary was printed and given to the patient.  FOLLOW UP: No follow-ups on file.

## 2018-12-23 ENCOUNTER — Encounter: Payer: Self-pay | Admitting: Family Medicine

## 2018-12-27 ENCOUNTER — Encounter: Payer: Self-pay | Admitting: Family Medicine

## 2018-12-27 ENCOUNTER — Ambulatory Visit (INDEPENDENT_AMBULATORY_CARE_PROVIDER_SITE_OTHER): Payer: Medicare HMO | Admitting: Family Medicine

## 2018-12-27 VITALS — BP 163/89 | HR 86 | Temp 98.2°F | Resp 16 | Ht 64.5 in | Wt 156.4 lb

## 2018-12-27 DIAGNOSIS — R0609 Other forms of dyspnea: Secondary | ICD-10-CM | POA: Diagnosis not present

## 2018-12-27 DIAGNOSIS — I10 Essential (primary) hypertension: Secondary | ICD-10-CM | POA: Diagnosis not present

## 2018-12-27 NOTE — Progress Notes (Signed)
OFFICE VISIT  12/27/2018   CC:  Chief Complaint  Patient presents with  . Follow-up    HTN, pt is fasting.     HPI:    Patient is a 53 y.o.  female who presents for 3 wk f/u uncontrolled HTN and to further discuss 6 mo hx of SOB. A/P as of last visit: "HTN improved but will increase toprol xl to 100mg  qd.   DOE: unclear etiology. Suspect she may have asthma. CXR ordered today. Once bp is under control we'll get PFTs and possibly EKG and additional cardiac testing".  Interim hx:   HTN: Toprol xl 100 mg qd was dropping her pressure too low:  100 systolic.   She then cut the 100 mg tab in half: bp's 115 over low 80s, HR 80 or so.     SOB: She never got the CXR that I ordered--too busy due to son's recent problem with HAs. Her SOB is still occurring, primarily on exertion like going up a flight of stairs.  Goes back to normal within a few minutes.  Occ she hears a whistle come from the chest when she exhales.  Occ has cough with her DOB/SOB. Denies CP with these episodes.  No pressure on chest.  No nausea or diaphoresis.     Past Medical History:  Diagnosis Date  . Allergic rhinitis   . Fibromyalgia   . Heart defect    ? PFO ("hole in heart")--pt states Dr. Jacinto Halim is her cardiologist.  . Hypertension   . Insomnia   . Migraine syndrome   . Scoliosis     Past Surgical History:  Procedure Laterality Date  . APPENDECTOMY  6th grade  . BACK SURGERY  remote past   discectomy x 1, then discectomy with titanium fusion  . CARDIAC SURGERY     "hole" closure  . CHOLECYSTECTOMY  prior to 2010  . COLONOSCOPY     "1990s" --reason unknown  . FOOT SURGERY  approx 1990   benign mass removed from bottom of foot    Outpatient Medications Prior to Visit  Medication Sig Dispense Refill  . carisoprodol (SOMA) 350 MG tablet Take 350 mg by mouth 4 (four) times daily as needed.      . cetirizine (ZYRTEC) 10 MG tablet Take 10 mg by mouth daily.    Marland Kitchen eletriptan (RELPAX) 20 MG tablet One  tablet by mouth as needed for migraine headache.  If the headache improves and then returns, dose may be repeated after 2 hours have elapsed since first dose (do not exceed 80 mg per day). may repeat in 2 hours if necessary     . fluticasone (FLONASE) 50 MCG/ACT nasal spray Place into both nostrils daily.    Marland Kitchen ibuprofen (ADVIL,MOTRIN) 200 MG tablet Take 200 mg by mouth every 6 (six) hours as needed.    . magnesium gluconate (MAGONATE) 500 MG tablet Take 500 mg by mouth 2 (two) times daily.    . Melatonin 5 MG TABS Take 1 tablet by mouth daily as needed.     . Milnacipran HCl (SAVELLA) 25 MG TABS Take 25 mg by mouth 2 (two) times daily.    . naratriptan (AMERGE) 2.5 MG tablet Take 2.5 mg by mouth as needed for migraine. Take one (1) tablet at onset of headache; if returns or does not resolve, may repeat after 4 hours; do not exceed five (5) mg in 24 hours.    . promethazine (PHENERGAN) 25 MG tablet Take 1 tablet (25  mg total) by mouth every 6 (six) hours as needed for nausea or vomiting. 30 tablet 0  . zolpidem (AMBIEN CR) 12.5 MG CR tablet Take 12.5 mg by mouth at bedtime as needed for sleep.     Marland Kitchen zonisamide (ZONEGRAN) 100 MG capsule Take 100 mg by mouth daily.    . metoprolol succinate (TOPROL-XL) 100 MG 24 hr tablet Take 1 tablet (100 mg total) by mouth daily. Take with or immediately following a meal. (Patient not taking: Reported on 12/27/2018) 30 tablet 0   No facility-administered medications prior to visit.     No Known Allergies  ROS As per HPI  PE: Blood pressure (!) 163/89, pulse 86, temperature 98.2 F (36.8 C), temperature source Oral, resp. rate 16, height 5' 4.5" (1.638 m), weight 156 lb 6 oz (70.9 kg), last menstrual period 12/13/2018, SpO2 97 %. Gen: Alert, well appearing.  Patient is oriented to person, place, time, and situation. AFFECT: pleasant, lucid thought and speech. CV: RRR, no m/r/g.   LUNGS: CTA bilat, nonlabored resps, good aeration in all lung fields. EXT: no  clubbing or cyanosis.  no edema.    LABS:    Chemistry      Component Value Date/Time   NA 139 11/11/2018 2139   K 3.5 11/11/2018 2139   CL 109 11/11/2018 2139   BUN 7 11/11/2018 2139   CREATININE 0.60 11/11/2018 2139      Component Value Date/Time   ALKPHOS 57 02/24/2008 0850   AST 17 02/24/2008 0850   ALT 28 02/24/2008 0850   BILITOT 0.5 02/24/2008 0850      12 lead EKG today: NSR, rate 76/min, isolated T wave inversion in aVL and T1, normal intervals and duration.  No ectopy.  No prior EKG for comparison.  IMPRESSION AND PLAN:  1) HTN: controlled with 1/2 of 100mg  toprol xl at this time. If needed in future, we'll bump her up to 1 and 1/2 of the 50mg  xl tabs to get 75mg  qd dosing since the 100mg  qd dosing made bp too low.  2) DOE: unclear etiology.  Asthma vs anginal equivalent ? EKG today-->normal. Pt needs to get the CXR that I ordered. I'll order PFT's as next step.  An After Visit Summary was printed and given to the patient.  FOLLOW UP: Return in about 4 weeks (around 01/24/2019) for f/u DOE.  Signed:  Santiago Bumpers, MD           12/27/2018

## 2018-12-28 DIAGNOSIS — G43719 Chronic migraine without aura, intractable, without status migrainosus: Secondary | ICD-10-CM | POA: Diagnosis not present

## 2018-12-28 DIAGNOSIS — M797 Fibromyalgia: Secondary | ICD-10-CM | POA: Diagnosis not present

## 2018-12-31 ENCOUNTER — Ambulatory Visit (INDEPENDENT_AMBULATORY_CARE_PROVIDER_SITE_OTHER): Payer: Medicare HMO

## 2018-12-31 ENCOUNTER — Encounter: Payer: Self-pay | Admitting: Family Medicine

## 2018-12-31 DIAGNOSIS — R0602 Shortness of breath: Secondary | ICD-10-CM | POA: Diagnosis not present

## 2018-12-31 DIAGNOSIS — R0609 Other forms of dyspnea: Secondary | ICD-10-CM | POA: Diagnosis not present

## 2019-01-13 ENCOUNTER — Encounter: Payer: Self-pay | Admitting: Family Medicine

## 2019-01-13 ENCOUNTER — Ambulatory Visit (HOSPITAL_COMMUNITY)
Admission: RE | Admit: 2019-01-13 | Discharge: 2019-01-13 | Disposition: A | Discharge status: Home / Self Care | Payer: Medicare HMO | Source: Ambulatory Visit | Attending: Family Medicine | Admitting: Family Medicine

## 2019-01-13 DIAGNOSIS — R0609 Other forms of dyspnea: Secondary | ICD-10-CM | POA: Diagnosis not present

## 2019-01-13 HISTORY — PX: OTHER SURGICAL HISTORY: SHX169

## 2019-01-13 LAB — PULMONARY FUNCTION TEST
DL/VA % pred: 106 %
DL/VA: 4.58 ml/min/mmHg/L
DLCO unc % pred: 96 %
DLCO unc: 20.36 ml/min/mmHg
FEF 25-75 Post: 2.66 L/sec
FEF 25-75 Pre: 2.56 L/sec
FEF2575-%Change-Post: 3 %
FEF2575-%PRED-PRE: 92 %
FEF2575-%Pred-Post: 96 %
FEV1-%Change-Post: 0 %
FEV1-%Pred-Post: 95 %
FEV1-%Pred-Pre: 95 %
FEV1-Post: 2.68 L
FEV1-Pre: 2.68 L
FEV1FVC-%Change-Post: 4 %
FEV1FVC-%Pred-Pre: 98 %
FEV6-%Change-Post: -3 %
FEV6-%Pred-Post: 94 %
FEV6-%Pred-Pre: 97 %
FEV6-Post: 3.25 L
FEV6-Pre: 3.37 L
FEV6FVC-%CHANGE-POST: 0 %
FEV6FVC-%Pred-Post: 102 %
FEV6FVC-%Pred-Pre: 102 %
FVC-%Change-Post: -3 %
FVC-%Pred-Post: 91 %
FVC-%Pred-Pre: 95 %
FVC-Post: 3.25 L
FVC-Pre: 3.38 L
PRE FEV1/FVC RATIO: 79 %
Post FEV1/FVC ratio: 83 %
Post FEV6/FVC ratio: 100 %
Pre FEV6/FVC Ratio: 100 %
RV % pred: 104 %
RV: 1.86 L
TLC % pred: 103 %
TLC: 5.21 L

## 2019-01-13 MED ORDER — ALBUTEROL SULFATE (2.5 MG/3ML) 0.083% IN NEBU
2.5000 mg | INHALATION_SOLUTION | Freq: Once | RESPIRATORY_TRACT | Status: AC
  Administered 2019-01-13: 2.5 mg via RESPIRATORY_TRACT

## 2019-01-25 ENCOUNTER — Encounter: Payer: Self-pay | Admitting: Family Medicine

## 2019-01-25 ENCOUNTER — Ambulatory Visit (INDEPENDENT_AMBULATORY_CARE_PROVIDER_SITE_OTHER): Payer: Medicare HMO | Admitting: Family Medicine

## 2019-01-25 VITALS — BP 111/73 | HR 81 | Temp 98.0°F | Resp 16 | Ht 64.5 in | Wt 155.0 lb

## 2019-01-25 DIAGNOSIS — M797 Fibromyalgia: Secondary | ICD-10-CM | POA: Diagnosis not present

## 2019-01-25 DIAGNOSIS — R5382 Chronic fatigue, unspecified: Secondary | ICD-10-CM | POA: Diagnosis not present

## 2019-01-25 DIAGNOSIS — R0609 Other forms of dyspnea: Secondary | ICD-10-CM

## 2019-01-25 DIAGNOSIS — I10 Essential (primary) hypertension: Secondary | ICD-10-CM

## 2019-01-25 NOTE — Progress Notes (Signed)
OFFICE VISIT  01/25/2019  CC:  Chief Complaint  Patient presents with  . Follow-up    RCI, pt is not fasting   HPI:    Patient is a 53 y.o. Caucasian female who presents accompanied by her mother for f/u DOE and HTN. In our past discussion of her symptoms she has had question of possible wheezing when SOB, but this is certainly crystal clear.  CXR normal.  PFTs equivocal: some small airway dz and minimal decrease in diffusion capacity, no response to bronchodilator.  Difficult historian: pt not clear on extent of SOB, seems to sometimes only be noticed by others like her son. Easily out of breath going up stairs, but rests a couple minutes and is back to baseline.  Insidious onset of SOB, seems to have gone hand in hand with chronic widespread myofascial pain and chronic fatigue.   No formal exercise in las 4-6 mo estimate. Generally hurts "all over".  Muscle aches all over from neck to feet.  Arthralgias in hands. No hx of joint swelling or erythema.  She has no recollection of ever seeing a rheumatologist.  HTN: her bp's had started intermittently spiking up to 160s systolic, ? Associated with or coming at times of HA? No vision complaints, no dizziness.  She went up on her toprol xl to 100mg  qd and in the last 8-10d her bp has been down around 130/80 consistently. No bp's lower than 110 syst or 60 diast.  ROS: no CP, no cough, no dizziness,no rashes, no melena/hematochezia.  No polyuria or polydipsia.  No fevers, no abnl wt loss.  No oral ulcers.  No coldness/color change of hands or feet.   Past Medical History:  Diagnosis Date  . Allergic rhinitis   . Cervical spondylosis   . Fibromyalgia   . Heart defect    PFO--closure procedure approx 2009 (Dr. Jacinto Halim)  . Hypertension   . Insomnia   . Migraine syndrome   . Scoliosis     Past Surgical History:  Procedure Laterality Date  . APPENDECTOMY  6th grade  . CARDIAC SURGERY     PFO closure 2009  . CERVICAL SPINE SURGERY   remote past   discectomy x 1, then discectomy with titanium fusion  . CHOLECYSTECTOMY  prior to 2010  . COLONOSCOPY     "1990s" --reason unknown  . FOOT SURGERY  approx 1990   benign mass removed from bottom of foot  . PFTs  01/13/2019   Minimal obstructive airway dz; no response to bronchodilator    Outpatient Medications Prior to Visit  Medication Sig Dispense Refill  . carisoprodol (SOMA) 350 MG tablet Take 350 mg by mouth 4 (four) times daily as needed.      . cetirizine (ZYRTEC) 10 MG tablet Take 10 mg by mouth daily.    Marland Kitchen eletriptan (RELPAX) 20 MG tablet One tablet by mouth as needed for migraine headache.  If the headache improves and then returns, dose may be repeated after 2 hours have elapsed since first dose (do not exceed 80 mg per day). may repeat in 2 hours if necessary     . fluticasone (FLONASE) 50 MCG/ACT nasal spray Place into both nostrils daily.    Marland Kitchen ibuprofen (ADVIL,MOTRIN) 200 MG tablet Take 200 mg by mouth every 6 (six) hours as needed.    . magnesium gluconate (MAGONATE) 500 MG tablet Take 500 mg by mouth 2 (two) times daily.    . Melatonin 5 MG TABS Take 1 tablet by mouth  daily as needed.     . metoprolol succinate (TOPROL-XL) 100 MG 24 hr tablet Take 1 tablet (100 mg total) by mouth daily. Take with or immediately following a meal. 30 tablet 0  . Milnacipran HCl (SAVELLA) 25 MG TABS Take 25 mg by mouth 2 (two) times daily.    . naratriptan (AMERGE) 2.5 MG tablet Take 2.5 mg by mouth as needed for migraine. Take one (1) tablet at onset of headache; if returns or does not resolve, may repeat after 4 hours; do not exceed five (5) mg in 24 hours.    . promethazine (PHENERGAN) 25 MG tablet Take 1 tablet (25 mg total) by mouth every 6 (six) hours as needed for nausea or vomiting. 30 tablet 0  . zolpidem (AMBIEN CR) 12.5 MG CR tablet Take 12.5 mg by mouth at bedtime as needed for sleep.     Marland Kitchen zonisamide (ZONEGRAN) 100 MG capsule Take 100 mg by mouth daily.     No  facility-administered medications prior to visit.     No Known Allergies  ROS As per HPI  PE: Blood pressure 111/73, pulse 81, temperature 98 F (36.7 C), temperature source Oral, resp. rate 16, height 5' 4.5" (1.638 m), weight 155 lb (70.3 kg), last menstrual period 12/31/2018, SpO2 100 %. Gen: Alert, well appearing.  Patient is oriented to person, place, time, and situation. AFFECT: pleasant, lucid thought and speech. CV: RRR, no m/r/g.   LUNGS: CTA bilat, nonlabored resps, good aeration in all lung fields. EXT: no clubbing or cyanosis.  no edema.  Musculoskeletal: no joint swelling, erythema, or warmth of any muscles or joints.  Very mild discomfort with palpation of all soft tissues/muscles from neck down to ankles.  ROM of all joints intact.   LABS:    Chemistry      Component Value Date/Time   NA 139 11/11/2018 2139   K 3.5 11/11/2018 2139   CL 109 11/11/2018 2139   BUN 7 11/11/2018 2139   CREATININE 0.60 11/11/2018 2139      Component Value Date/Time   ALKPHOS 57 02/24/2008 0850   AST 17 02/24/2008 0850   ALT 28 02/24/2008 0850   BILITOT 0.5 02/24/2008 0850     Lab Results  Component Value Date   WBC 5.9 10/09/2008   HGB 11.2 (L) 11/11/2018   HCT 33.0 (L) 11/11/2018   MCV 93.1 10/09/2008   PLT 315 10/09/2008    IMPRESSION AND PLAN:  1) DOE, mild.  I think this is due to gradually progressive generalized CV/pulm/musculoskeletal deconditioning (fibromyalgia syndrome). Fortunately, her sx's are not severe.  She is hoping that in the near future, as some of her current difficult/busy life situations calm down, she'll be able to focus on getting into a routine of some exercise and work on increasing slowly. Signs/symptoms to call or return for were reviewed and pt expressed understanding. No further w/u at this time.  No change in meds.  She is not open to PT/Integrative therapies at this time (says she has done integrative therapies in the past) due to schedule  being so busy.  2) HTN: well controlled now on Toprol xl 100 mg qd.  Has occasional spikes up to 160s systolic that I suspect are brought on by HA (not CAUSING HA's).  Continue home bp monitoring.  Goal 130/80.  Spent 30 min with pt today, with >50% of this time spent in counseling and care coordination regarding the above problems.  An After Visit Summary was printed  and given to the patient.  FOLLOW UP: Return in about 3 months (around 04/25/2019) for routine chronic illness f/u (30 min).  Signed:  Santiago BumpersPhil Madylyn Insco, MD           01/25/2019

## 2019-02-08 ENCOUNTER — Other Ambulatory Visit: Payer: Self-pay

## 2019-02-08 ENCOUNTER — Other Ambulatory Visit: Payer: Self-pay | Admitting: Family Medicine

## 2019-02-08 MED ORDER — METOPROLOL SUCCINATE ER 100 MG PO TB24: 100.0000 mg | ORAL_TABLET | Freq: Every day | ORAL | 0 refills | Status: DC

## 2019-02-08 NOTE — Telephone Encounter (Signed)
Medication has been sent.  

## 2019-03-09 ENCOUNTER — Other Ambulatory Visit: Payer: Self-pay | Admitting: Family Medicine

## 2019-04-11 ENCOUNTER — Other Ambulatory Visit: Payer: Self-pay

## 2019-04-11 MED ORDER — METOPROLOL SUCCINATE ER 100 MG PO TB24: ORAL_TABLET | ORAL | 0 refills | Status: DC

## 2019-04-28 ENCOUNTER — Ambulatory Visit (INDEPENDENT_AMBULATORY_CARE_PROVIDER_SITE_OTHER): Payer: Medicare HMO | Admitting: Family Medicine

## 2019-04-28 ENCOUNTER — Other Ambulatory Visit: Payer: Self-pay

## 2019-04-28 ENCOUNTER — Encounter: Payer: Self-pay | Admitting: Family Medicine

## 2019-04-28 VITALS — Temp 97.8°F | Wt 155.0 lb

## 2019-04-28 DIAGNOSIS — M797 Fibromyalgia: Secondary | ICD-10-CM | POA: Diagnosis not present

## 2019-04-28 DIAGNOSIS — G9332 Myalgic encephalomyelitis/chronic fatigue syndrome: Secondary | ICD-10-CM

## 2019-04-28 DIAGNOSIS — I10 Essential (primary) hypertension: Secondary | ICD-10-CM

## 2019-04-28 DIAGNOSIS — R5382 Chronic fatigue, unspecified: Secondary | ICD-10-CM | POA: Diagnosis not present

## 2019-04-28 NOTE — Progress Notes (Signed)
Virtual Visit via Video Note  I connected with pt on 04/28/19 at  9:40 AM EDT by a video enabled telemedicine application and verified that I am speaking with the correct person using two identifiers.  Location patient: home Location provider:work or home office Persons participating in the virtual visit: patient, provider  I discussed the limitations of evaluation and management by telemedicine and the availability of in person appointments. The patient expressed understanding and agreed to proceed.  Telemedicine visit is a necessity given the COVID-19 restrictions in place at the current time.  HPI: 53 y/o WF being seen today for 3 mo f/u fibromyalgia and HTN. She has mild chronic DOE associated with her chronic widespread musculoskeletal pain and it's associated deconditioning.  She does have chronic fatigue.  Pain: not very active at all since last f/u.  Stays tired all the time/no energy. Stays in bed more and this makes things worse.  She is taking ibup qd to qod. Taking savella qd, soma, and ambien.  HTN: last visit her bp was well controlled on toprol xl 100mg  qd.  In the past her bp has spiked some after she gets a bad HA.  She was to continue home bp monitoring. Home bp consistently <130/80.  Occ gets some in 100s/60s. HR low 80s.   ROS: See pertinent positives and negatives per HPI.  Past Medical History:  Diagnosis Date  . Allergic rhinitis   . Cervical spondylosis   . Fibromyalgia   . Heart defect    PFO--closure procedure approx 2009 (Dr. Jacinto HalimGanji)  . Hypertension   . Insomnia   . Migraine syndrome   . Scoliosis     Past Surgical History:  Procedure Laterality Date  . APPENDECTOMY  6th grade  . CARDIAC SURGERY     PFO closure 2009  . CERVICAL SPINE SURGERY  remote past   discectomy x 1, then discectomy with titanium fusion  . CHOLECYSTECTOMY  prior to 2010  . COLONOSCOPY     "1990s" --reason unknown  . FOOT SURGERY  approx 1990   benign mass removed from  bottom of foot  . PFTs  01/13/2019   Minimal obstructive airway dz; no response to bronchodilator    Family History  Problem Relation Age of Onset  . Heart disease Father   . Diabetes Father   . Kidney disease Father   . 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   Current Outpatient Medications:  .  carisoprodol (SOMA) 350 MG tablet, Take 350 mg by mouth 4 (four) times daily as needed.  , Disp: , Rfl:  .  cetirizine (ZYRTEC) 10 MG tablet, Take 10 mg by mouth daily., Disp: , Rfl:  .  eletriptan (RELPAX) 20 MG tablet, One tablet by mouth as needed for migraine headache.  If the headache improves and then returns, dose may be repeated after 2 hours have elapsed since first dose (do not exceed 80 mg per day). may repeat in 2 hours if necessary , Disp: , Rfl:  .  fluticasone (FLONASE) 50 MCG/ACT nasal spray, Place into both nostrils daily., Disp: , Rfl:  .  ibuprofen (ADVIL,MOTRIN) 200 MG tablet, Take 200 mg by mouth every 6 (six) hours as needed., Disp: , Rfl:  .  magnesium gluconate (MAGONATE) 500 MG tablet, Take 500 mg by mouth 2 (two) times daily., Disp: , Rfl:  .  Melatonin 5 MG TABS, Take 1 tablet by mouth  daily as needed. , Disp: , Rfl:  .  metoprolol succinate (TOPROL-XL) 100 MG 24 hr tablet, TAKE 1 TABLET BY MOUTH DAILY, TAKE WITH OR IMMEDIATELY FOLLOWING A MEAL., Disp: 30 tablet, Rfl: 0 .  Milnacipran HCl (SAVELLA) 25 MG TABS, Take 25 mg by mouth 2 (two) times daily., Disp: , Rfl:  .  naratriptan (AMERGE) 2.5 MG tablet, Take 2.5 mg by mouth as needed for migraine. Take one (1) tablet at onset of headache; if returns or does not resolve, may repeat after 4 hours; do not exceed five (5) mg in 24 hours., Disp: , Rfl:  .  zolpidem (AMBIEN CR) 12.5 MG CR tablet, Take 12.5 mg by mouth at bedtime as needed for sleep. , Disp: , Rfl:  .  zonisamide (ZONEGRAN) 100 MG capsule, Take 100 mg by mouth daily., Disp: , Rfl:  .   promethazine (PHENERGAN) 25 MG tablet, Take 1 tablet (25 mg total) by mouth every 6 (six) hours as needed for nausea or vomiting. (Patient not taking: Reported on 04/28/2019), Disp: 30 tablet, Rfl: 0  EXAM:  VITALS per patient if applicable: Temp 97.8 F (36.6 C) (Oral)   Wt 155 lb (70.3 kg)   BMI 26.19 kg/m    GENERAL: alert, oriented, appears well and in no acute distress  HEENT: atraumatic, conjunttiva clear, no obvious abnormalities on inspection of external nose and ears  NECK: normal movements of the head and neck  LUNGS: on inspection no signs of respiratory distress, breathing rate appears normal, no obvious gross SOB, gasping or wheezing  CV: no obvious cyanosis  MS: moves all visible extremities without noticeable abnormality  PSYCH/NEURO: pleasant and cooperative, no obvious depression or anxiety, speech and thought processing grossly intact  LABS: none today  No results found for: HGBA1C  Lab Results  Component Value Date   TSH 1.62 12/02/2018   Lab Results  Component Value Date   CHOL  02/24/2008    113        ATP III CLASSIFICATION:  <200     mg/dL   Desirable  161-096  mg/dL   Borderline High  >=045    mg/dL   High   HDL 30 (L) 40/98/1191   LDLCALC  02/24/2008    72        Total Cholesterol/HDL:CHD Risk Coronary Heart Disease Risk Table                     Men   Women  1/2 Average Risk   3.4   3.3   TRIG 55 02/24/2008   CHOLHDL 3.8 02/24/2008      Chemistry      Component Value Date/Time   NA 139 11/11/2018 2139   K 3.5 11/11/2018 2139   CL 109 11/11/2018 2139   BUN 7 11/11/2018 2139   CREATININE 0.60 11/11/2018 2139      Component Value Date/Time   ALKPHOS 57 02/24/2008 0850   AST 17 02/24/2008 0850   ALT 28 02/24/2008 0850   BILITOT 0.5 02/24/2008 0850     Lab Results  Component Value Date   WBC 5.9 10/09/2008   HGB 11.2 (L) 11/11/2018   HCT 33.0 (L) 11/11/2018   MCV 93.1 10/09/2008   PLT 315 10/09/2008    ASSESSMENT AND  PLAN:  Discussed the following assessment and plan:  1) Fibromyalgia/chronic fatigue: she is at her baseline. No new meds at this time. She is not open to PT or other therapies for this  at this time. Strongly encouraged her to start daily exercise in order to help her feel better.  2) HTN: The current medical regimen is effective;  continue present plan and medications.   I discussed the assessment and treatment plan with the patient. The patient was provided an opportunity to ask questions and all were answered. The patient agreed with the plan and demonstrated an understanding of the instructions.   The patient was advised to call back or seek an in-person evaluation if the symptoms worsen or if the condition fails to improve as anticipated.  F/u: CPE 3 mo->fasting labs  Signed:  Santiago Bumpers, MD           04/28/2019

## 2019-05-06 ENCOUNTER — Other Ambulatory Visit: Payer: Self-pay | Admitting: Family Medicine

## 2019-05-09 ENCOUNTER — Other Ambulatory Visit: Payer: Self-pay | Admitting: Family Medicine

## 2019-08-04 ENCOUNTER — Other Ambulatory Visit: Payer: Self-pay | Admitting: Family Medicine

## 2019-08-05 ENCOUNTER — Encounter: Payer: Medicare HMO | Admitting: Family Medicine

## 2019-08-09 DIAGNOSIS — R7301 Impaired fasting glucose: Secondary | ICD-10-CM

## 2019-08-09 HISTORY — DX: Impaired fasting glucose: R73.01

## 2019-08-11 ENCOUNTER — Ambulatory Visit (INDEPENDENT_AMBULATORY_CARE_PROVIDER_SITE_OTHER): Payer: Medicare HMO | Admitting: Family Medicine

## 2019-08-11 ENCOUNTER — Other Ambulatory Visit (INDEPENDENT_AMBULATORY_CARE_PROVIDER_SITE_OTHER): Payer: Medicare HMO

## 2019-08-11 ENCOUNTER — Other Ambulatory Visit: Payer: Self-pay

## 2019-08-11 ENCOUNTER — Encounter: Payer: Self-pay | Admitting: Family Medicine

## 2019-08-11 VITALS — BP 122/88 | HR 72 | Temp 98.8°F | Resp 16 | Ht 64.5 in | Wt 156.6 lb

## 2019-08-11 DIAGNOSIS — J019 Acute sinusitis, unspecified: Secondary | ICD-10-CM

## 2019-08-11 DIAGNOSIS — I10 Essential (primary) hypertension: Secondary | ICD-10-CM

## 2019-08-11 DIAGNOSIS — G4700 Insomnia, unspecified: Secondary | ICD-10-CM

## 2019-08-11 DIAGNOSIS — Z1239 Encounter for other screening for malignant neoplasm of breast: Secondary | ICD-10-CM

## 2019-08-11 DIAGNOSIS — Z23 Encounter for immunization: Secondary | ICD-10-CM

## 2019-08-11 DIAGNOSIS — Z0000 Encounter for general adult medical examination without abnormal findings: Secondary | ICD-10-CM | POA: Diagnosis not present

## 2019-08-11 DIAGNOSIS — Z1211 Encounter for screening for malignant neoplasm of colon: Secondary | ICD-10-CM

## 2019-08-11 LAB — CBC WITH DIFFERENTIAL/PLATELET
Basophils Absolute: 0.1 10*3/uL (ref 0.0–0.1)
Basophils Relative: 0.8 % (ref 0.0–3.0)
Eosinophils Absolute: 0.2 10*3/uL (ref 0.0–0.7)
Eosinophils Relative: 2.9 % (ref 0.0–5.0)
HCT: 40.7 % (ref 36.0–46.0)
Hemoglobin: 13.8 g/dL (ref 12.0–15.0)
Lymphocytes Relative: 28.1 % (ref 12.0–46.0)
Lymphs Abs: 1.9 10*3/uL (ref 0.7–4.0)
MCHC: 33.8 g/dL (ref 30.0–36.0)
MCV: 94 fl (ref 78.0–100.0)
Monocytes Absolute: 0.6 10*3/uL (ref 0.1–1.0)
Monocytes Relative: 9.3 % (ref 3.0–12.0)
Neutro Abs: 4.1 10*3/uL (ref 1.4–7.7)
Neutrophils Relative %: 58.9 % (ref 43.0–77.0)
Platelets: 315 10*3/uL (ref 150.0–400.0)
RBC: 4.34 Mil/uL (ref 3.87–5.11)
RDW: 13.2 % (ref 11.5–15.5)
WBC: 6.9 10*3/uL (ref 4.0–10.5)

## 2019-08-11 LAB — LIPID PANEL
Cholesterol: 171 mg/dL (ref 0–200)
HDL: 43.3 mg/dL (ref >39.00)
LDL Cholesterol: 108 mg/dL — ABNORMAL HIGH (ref 0–99)
NonHDL: 128.07
Total CHOL/HDL Ratio: 4
Triglycerides: 98 mg/dL (ref 0.0–149.0)
VLDL: 19.6 mg/dL (ref 0.0–40.0)

## 2019-08-11 LAB — COMPREHENSIVE METABOLIC PANEL
ALT: 15 U/L (ref 0–35)
AST: 12 U/L (ref 0–37)
Albumin: 4.1 g/dL (ref 3.5–5.2)
Alkaline Phosphatase: 72 U/L (ref 39–117)
BUN: 16 mg/dL (ref 6–23)
CO2: 25 mEq/L (ref 19–32)
Calcium: 9.3 mg/dL (ref 8.4–10.5)
Chloride: 108 mEq/L (ref 96–112)
Creatinine, Ser: 1.03 mg/dL (ref 0.40–1.20)
GFR: 56.42 mL/min — ABNORMAL LOW (ref >60.00)
Glucose, Bld: 106 mg/dL — ABNORMAL HIGH (ref 70–99)
Potassium: 4.6 mEq/L (ref 3.5–5.1)
Sodium: 139 mEq/L (ref 135–145)
Total Bilirubin: 0.3 mg/dL (ref 0.2–1.2)
Total Protein: 7 g/dL (ref 6.0–8.3)

## 2019-08-11 LAB — TSH: TSH: 1.28 u[IU]/mL (ref 0.35–4.50)

## 2019-08-11 MED ORDER — AMOXICILLIN 875 MG PO TABS: 875.0000 mg | ORAL_TABLET | Freq: Two times a day (BID) | ORAL | 0 refills | Status: AC

## 2019-08-11 NOTE — Patient Instructions (Signed)
Health Maintenance, Female Adopting a healthy lifestyle and getting preventive care are important in promoting health and wellness. Ask your health care provider about:  The right schedule for you to have regular tests and exams.  Things you can do on your own to prevent diseases and keep yourself healthy. What should I know about diet, weight, and exercise? Eat a healthy diet   Eat a diet that includes plenty of vegetables, fruits, low-fat dairy products, and lean protein.  Do not eat a lot of foods that are high in solid fats, added sugars, or sodium. Maintain a healthy weight Body mass index (BMI) is used to identify weight problems. It estimates body fat based on height and weight. Your health care provider can help determine your BMI and help you achieve or maintain a healthy weight. Get regular exercise Get regular exercise. This is one of the most important things you can do for your health. Most adults should:  Exercise for at least 150 minutes each week. The exercise should increase your heart rate and make you sweat (moderate-intensity exercise).  Do strengthening exercises at least twice a week. This is in addition to the moderate-intensity exercise.  Spend less time sitting. Even light physical activity can be beneficial. Watch cholesterol and blood lipids Have your blood tested for lipids and cholesterol at 51 years of age, then have this test every 5 years. Have your cholesterol levels checked more often if:  Your lipid or cholesterol levels are high.  You are older than 51 years of age.  You are at high risk for heart disease. What should I know about cancer screening? Depending on your health history and family history, you may need to have cancer screening at various ages. This may include screening for:  Breast cancer.  Cervical cancer.  Colorectal cancer.  Skin cancer.  Lung cancer. What should I know about heart disease, diabetes, and high blood  pressure? Blood pressure and heart disease  High blood pressure causes heart disease and increases the risk of stroke. This is more likely to develop in people who have high blood pressure readings, are of African descent, or are overweight.  Have your blood pressure checked: ? Every 3-5 years if you are 18-39 years of age. ? Every year if you are 40 years old or older. Diabetes Have regular diabetes screenings. This checks your fasting blood sugar level. Have the screening done:  Once every three years after age 40 if you are at a normal weight and have a low risk for diabetes.  More often and at a younger age if you are overweight or have a high risk for diabetes. What should I know about preventing infection? Hepatitis B If you have a higher risk for hepatitis B, you should be screened for this virus. Talk with your health care provider to find out if you are at risk for hepatitis B infection. Hepatitis C Testing is recommended for:  Everyone born from 1945 through 1965.  Anyone with known risk factors for hepatitis C. Sexually transmitted infections (STIs)  Get screened for STIs, including gonorrhea and chlamydia, if: ? You are sexually active and are younger than 51 years of age. ? You are older than 51 years of age and your health care provider tells you that you are at risk for this type of infection. ? Your sexual activity has changed since you were last screened, and you are at increased risk for chlamydia or gonorrhea. Ask your health care provider if   you are at risk.  Ask your health care provider about whether you are at high risk for HIV. Your health care provider may recommend a prescription medicine to help prevent HIV infection. If you choose to take medicine to prevent HIV, you should first get tested for HIV. You should then be tested every 3 months for as long as you are taking the medicine. Pregnancy  If you are about to stop having your period (premenopausal) and  you may become pregnant, seek counseling before you get pregnant.  Take 400 to 800 micrograms (mcg) of folic acid every day if you become pregnant.  Ask for birth control (contraception) if you want to prevent pregnancy. Osteoporosis and menopause Osteoporosis is a disease in which the bones lose minerals and strength with aging. This can result in bone fractures. If you are 65 years old or older, or if you are at risk for osteoporosis and fractures, ask your health care provider if you should:  Be screened for bone loss.  Take a calcium or vitamin D supplement to lower your risk of fractures.  Be given hormone replacement therapy (HRT) to treat symptoms of menopause. Follow these instructions at home: Lifestyle  Do not use any products that contain nicotine or tobacco, such as cigarettes, e-cigarettes, and chewing tobacco. If you need help quitting, ask your health care provider.  Do not use street drugs.  Do not share needles.  Ask your health care provider for help if you need support or information about quitting drugs. Alcohol use  Do not drink alcohol if: ? Your health care provider tells you not to drink. ? You are pregnant, may be pregnant, or are planning to become pregnant.  If you drink alcohol: ? Limit how much you use to 0-1 drink a day. ? Limit intake if you are breastfeeding.  Be aware of how much alcohol is in your drink. In the [place] one drink equals one 12 oz bottle of beer (355 mL), one 5 oz glass of wine (148 mL), or one 1 oz glass of hard liquor (44 mL). General instructions  Schedule regular health, dental, and eye exams.  Stay current with your vaccines.  Tell your health care provider if: ? You often feel depressed. ? You have ever been abused or do not feel safe at home. Summary  Adopting a healthy lifestyle and getting preventive care are important in promoting health and wellness.  Follow your health care provider's instructions about healthy  diet, exercising, and getting tested or screened for diseases.  Follow your health care provider's instructions on monitoring your cholesterol and blood pressure. This information is not intended to replace advice given to you by your health care provider. Make sure you discuss any questions you have with your health care provider. Document Released: 06/09/2011 Document Revised: 11/17/2018 Document Reviewed: 11/17/2018 [employer] Patient Education  2020 [employer]  

## 2019-08-11 NOTE — Progress Notes (Signed)
Office Note 08/11/2019  CC:  Chief Complaint  Patient presents with  . Annual Exam    pt is fasting    HPI:  MICHAELLA Haynes is a 53 y.o. White female who is here for annual health maintenance exam.  Has had about 2 mo of nasal congestion/PND/runny nose, face pain, intermittent ear pains. No cough, no fever, no SOB.  Has noted some ST but only at night when PND is the worst. No wheezing. Takes flonase and zyrtec daily.  No signif vigorous exercise but is active/not sedentary. Diet is fair.  She has not seen her GYN in >5 yrs, says she plans on calling the office to make appt, though.  Past Medical History:  Diagnosis Date  . Allergic rhinitis   . Cervical spondylosis   . Fibromyalgia   . Heart defect    PFO--closure procedure approx 2009 (Dr. Einar Gip)  . Hypertension   . Insomnia   . Migraine syndrome   . Scoliosis     Past Surgical History:  Procedure Laterality Date  . APPENDECTOMY  6th grade  . CARDIAC SURGERY     PFO closure 2009  . CERVICAL SPINE SURGERY  remote past   discectomy x 1, then discectomy with titanium fusion  . CHOLECYSTECTOMY  prior to 2010  . COLONOSCOPY     "1990s" --reason unknown  . FOOT SURGERY  approx 1990   benign mass removed from bottom of foot  . PFTs  01/13/2019   Minimal obstructive airway dz; no response to bronchodilator    Family History  Problem Relation Age of Onset  . Heart disease Father   . Diabetes Father   . Kidney disease Father   . Stroke Father     Social History   Socioeconomic History  . Marital status: Married    Spouse name: Not on file  . Number of children: Not on file  . Years of education: Not on file  . Highest education level: Not on file  Occupational History  . Not on file  Social Needs  . Financial resource strain: Not on file  . Food insecurity    Worry: Not on file    Inability: Not on file  . Transportation needs    Medical: Not on file    Non-medical: Not on file  Tobacco Use  .  Smoking status: Never Smoker  . Smokeless tobacco: Never Used  Substance and Sexual Activity  . Alcohol use: No  . Drug use: No  . Sexual activity: Not on file  Lifestyle  . Physical activity    Days per week: Not on file    Minutes per session: Not on file  . Stress: Not on file  Relationships  . Social Herbalist on phone: Not on file    Gets together: Not on file    Attends religious service: Not on file    Active member of club or organization: Not on file    Attends meetings of clubs or organizations: Not on file    Relationship status: Not on file  . Intimate partner violence    Fear of current or ex partner: Not on file    Emotionally abused: Not on file    Physically abused: Not on file    Forced sexual activity: Not on file  Other Topics Concern  . Not on file  Social History Narrative   Widow, has 1 son named Hydrologist.   Educ: "4 yr degree"  Former occup: Architectrecreational therapist.  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     Denies tob or alc or street drug use.   Says she was once on oxycontin for fibromyalgia pain but this was in the far remote past.    Outpatient Medications Prior to Visit  Medication Sig Dispense Refill  . carisoprodol (SOMA) 350 MG tablet Take 350 mg by mouth 4 (four) times daily as needed.      . cetirizine (ZYRTEC) 10 MG tablet Take 10 mg by mouth daily.    Marland Kitchen. eletriptan (RELPAX) 20 MG tablet One tablet by mouth as needed for migraine headache.  If the headache improves and then returns, dose may be repeated after 2 hours have elapsed since first dose (do not exceed 80 mg per day). may repeat in 2 hours if necessary     . fluticasone (FLONASE) 50 MCG/ACT nasal spray Place into both nostrils daily.    Marland Kitchen. ibuprofen (ADVIL,MOTRIN) 200 MG tablet Take 200 mg by mouth every 6 (six) hours as needed.    . magnesium gluconate (MAGONATE) 500 MG tablet Take 500 mg by mouth 2 (two) times daily.    . Melatonin 5 MG TABS Take  1 tablet by mouth daily as needed.     . metoprolol succinate (TOPROL-XL) 100 MG 24 hr tablet TAKE 1 TABLET BY MOUTH DAILY WITH A MEAL OR IMMEDIATELY AFTER A MEAL. 90 tablet 0  . Milnacipran HCl (SAVELLA) 25 MG TABS Take 25 mg by mouth 2 (two) times daily.    . naratriptan (AMERGE) 2.5 MG tablet Take 2.5 mg by mouth as needed for migraine. Take one (1) tablet at onset of headache; if returns or does not resolve, may repeat after 4 hours; do not exceed five (5) mg in 24 hours.    Marland Kitchen. zolpidem (AMBIEN CR) 12.5 MG CR tablet Take 12.5 mg by mouth at bedtime as needed for sleep.     Marland Kitchen. zonisamide (ZONEGRAN) 100 MG capsule Take 100 mg by mouth daily.    . promethazine (PHENERGAN) 25 MG tablet Take 1 tablet (25 mg total) by mouth every 6 (six) hours as needed for nausea or vomiting. (Patient not taking: Reported on 04/28/2019) 30 tablet 0   No facility-administered medications prior to visit.     No Known Allergies  ROS Review of Systems  Constitutional: Negative for appetite change, chills, fatigue and fever.  HENT: Positive for postnasal drip, rhinorrhea, sinus pressure and sinus pain. Negative for congestion, dental problem, ear pain and sore throat.   Eyes: Negative for discharge, redness and visual disturbance.  Respiratory: Negative for cough, chest tightness, shortness of breath and wheezing.   Cardiovascular: Negative for chest pain, palpitations and leg swelling.  Gastrointestinal: Negative for abdominal pain, blood in stool, diarrhea, nausea and vomiting.  Genitourinary: Negative for difficulty urinating, dysuria, flank pain, frequency, hematuria and urgency.  Musculoskeletal: Negative for arthralgias, back pain, joint swelling, myalgias and neck stiffness.  Skin: Negative for pallor and rash.  Neurological: Negative for dizziness, speech difficulty, weakness and headaches.  Hematological: Negative for adenopathy. Does not bruise/bleed easily.  Psychiatric/Behavioral: Negative for confusion  and sleep disturbance. The patient is not nervous/anxious.     PE; Blood pressure 122/88, pulse 72, temperature 98.8 F (37.1 C), temperature source Temporal, resp. rate 16, height 5' 4.5" (1.638 m), weight 156 lb 9.6 oz (71 kg), SpO2 96 %. Body mass index is 26.47 kg/m. Exam chaperoned by Emi HolesBrittanae Staton, CMA.  Gen:  Alert, well appearing.  Patient is oriented to person, place, time, and situation. AFFECT: pleasant, lucid thought and speech. ENT: Ears: EACs clear, normal epithelium.  TMs with good light reflex and landmarks bilaterally.  Eyes: no injection, icteris, swelling, or exudate.  EOMI, PERRLA. Nose: scant drainage, mild mucosal edema and injection.  No focal lesion.  Mild TTP of infra-orbital regions/maxillary sinuses diffusely. Mouth: lips without lesion/swelling.  Oral mucosa pink and moist.  Dentition intact and without obvious caries or gingival swelling.  Oropharynx without erythema, exudate, or swelling.  Neck: supple/nontender.  No LAD, mass, or TM.  Carotid pulses 2+ bilaterally, without bruits. CV: RRR, no m/r/g.   LUNGS: CTA bilat, nonlabored resps, good aeration in all lung fields. ABD: soft, NT, ND, BS normal.  No hepatospenomegaly or mass.  No bruits. EXT: no clubbing, cyanosis, or edema.  Musculoskeletal: no joint swelling, erythema, warmth, or tenderness.  ROM of all joints intact. Skin - no sores or suspicious lesions or rashes or color changes   Pertinent labs:  Lab Results  Component Value Date   TSH 1.62 12/02/2018   Lab Results  Component Value Date   WBC 5.9 10/09/2008   HGB 11.2 (L) 11/11/2018   HCT 33.0 (L) 11/11/2018   MCV 93.1 10/09/2008   PLT 315 10/09/2008   Lab Results  Component Value Date   CREATININE 0.60 11/11/2018   BUN 7 11/11/2018   NA 139 11/11/2018   K 3.5 11/11/2018   CL 109 11/11/2018   Lab Results  Component Value Date   ALT 28 02/24/2008   AST 17 02/24/2008   ALKPHOS 57 02/24/2008   BILITOT 0.5 02/24/2008   Lab  Results  Component Value Date   CHOL  02/24/2008    113        ATP III CLASSIFICATION:  <200     mg/dL   Desirable  375-436  mg/dL   Borderline High  >=067    mg/dL   High   Lab Results  Component Value Date   HDL 30 (L) 02/24/2008   Lab Results  Component Value Date   LDLCALC  02/24/2008    72        Total Cholesterol/HDL:CHD Risk Coronary Heart Disease Risk Table                     Men   Women  1/2 Average Risk   3.4   3.3   Lab Results  Component Value Date   TRIG 55 02/24/2008   Lab Results  Component Value Date   CHOLHDL 3.8 02/24/2008   ASSESSMENT AND PLAN:    1) Acute sinusitis (vs poor control of allerg rhinitis): amoxil 875 bid x 10d.  Continue flonase and zyrtec.  If not improved (but not worse) will do trial of prednisone.  Pt to call with report.  2) Health maintenance exam: Reviewed age and gender appropriate health maintenance issues (prudent diet, regular exercise, health risks of tobacco and excessive alcohol, use of seatbelts, fire alarms in home, use of sunscreen).  Also reviewed age and gender appropriate health screening as well as vaccine recommendations. Vaccines: Flu->gave this today.  Tdap->deferred today b/c she wants to check her records to see when it was last given (we have no record).  Shingrix->rx sent to pharmacy (medicare). Labs: fasting HP today. Cervical ca screening: pt will contact her GYN office.  She'll call us if she needs new referral. Breast ca screening: mammogram-->ordered. Colon ca screening: due for  screening colonoscopy-->referral to GI today.   An After Visit Summary was printed and given to the patient.  FOLLOW UP:  Return in about 6 months (around 02/08/2020) for routine chronic illness f/u.  Signed:  Santiago BumpersPhil Shirley Decamp, MD           08/11/2019

## 2019-08-12 ENCOUNTER — Encounter: Payer: Self-pay | Admitting: Family Medicine

## 2019-08-12 ENCOUNTER — Other Ambulatory Visit (INDEPENDENT_AMBULATORY_CARE_PROVIDER_SITE_OTHER): Payer: Medicare HMO

## 2019-08-12 DIAGNOSIS — R7309 Other abnormal glucose: Secondary | ICD-10-CM

## 2019-08-12 LAB — HEMOGLOBIN A1C: Hgb A1c MFr Bld: 5.6 % (ref 4.6–6.5)

## 2019-09-11 IMAGING — DX DG CHEST 2V
2 series · 2 of 2 positions shown · non-contrast
Comparison: Chest CTA dated 02/24/2008 and chest radiographs dated
02/12/2008.

CLINICAL DATA: Shortness of breath for the past 6 months.

EXAM:
CHEST - 2 VIEW

[chest pa]
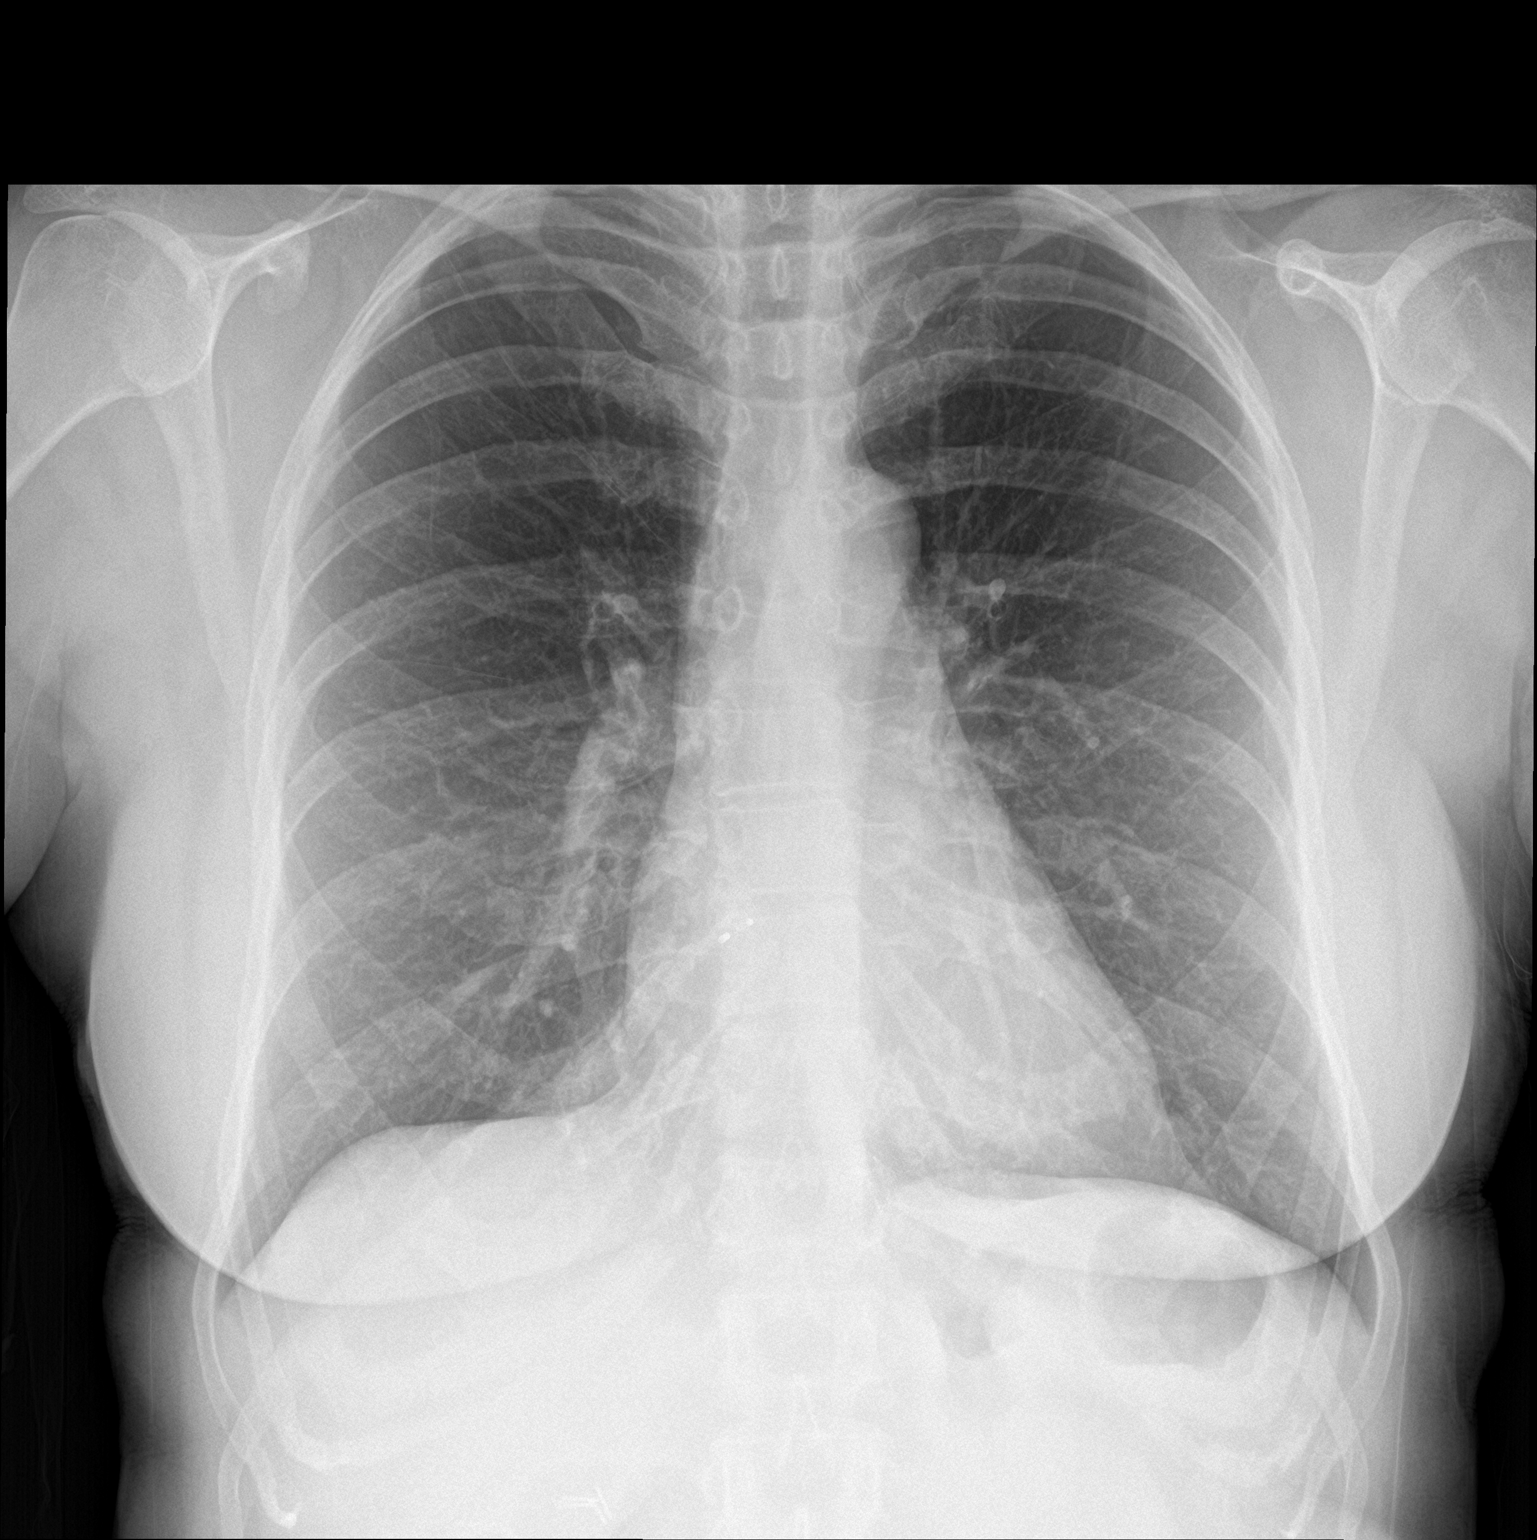

[chest lat]
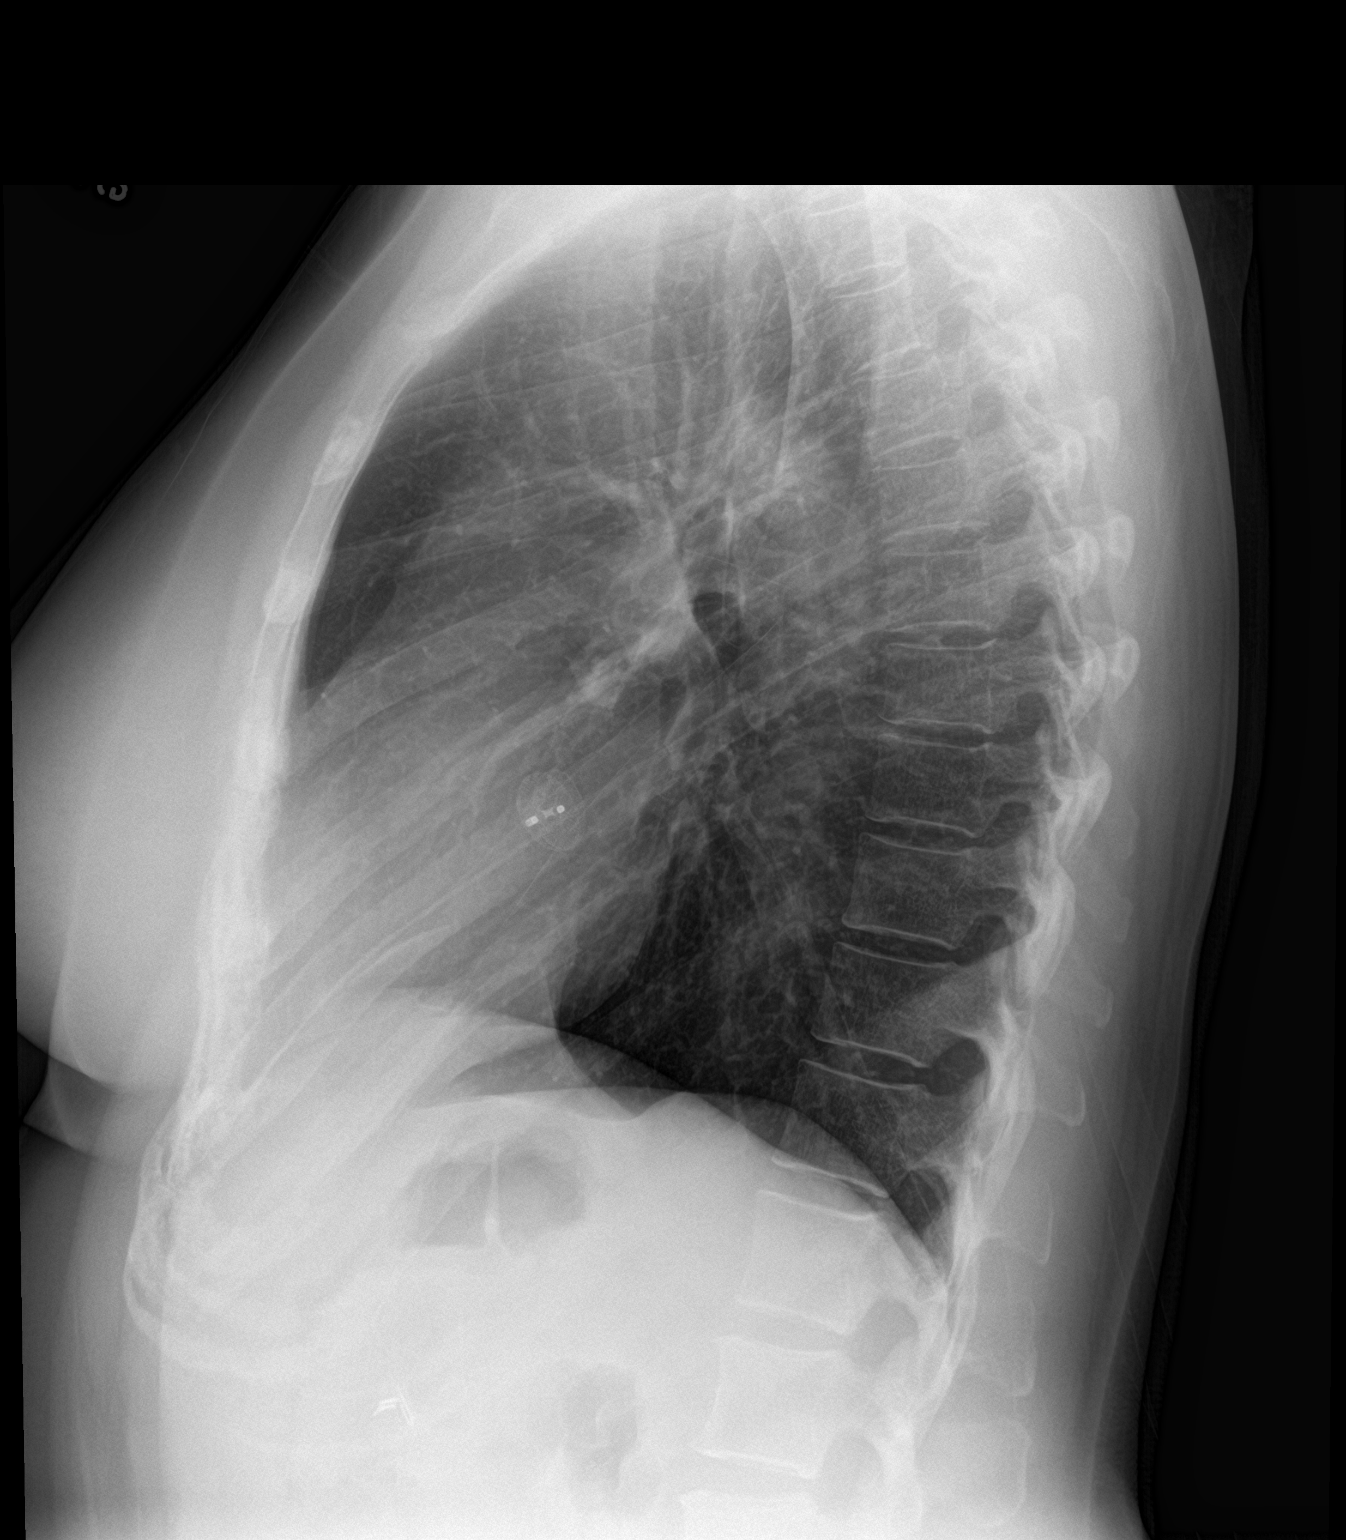

[2 of 2 positions shown; findings below may reference images not displayed]

FINDINGS: Normal sized heart. Clear lungs. Intracardiac closure device. Mild
diffuse peribronchial thickening. Cervical spine fixation hardware.
Cholecystectomy clips.
IMPRESSION: Mild bronchitic changes.

## 2019-10-14 ENCOUNTER — Encounter: Payer: Self-pay | Admitting: Family Medicine

## 2019-11-01 ENCOUNTER — Other Ambulatory Visit: Payer: Self-pay

## 2019-11-01 MED ORDER — METOPROLOL SUCCINATE ER 100 MG PO TB24: ORAL_TABLET | ORAL | 1 refills | Status: DC

## 2019-11-29 DIAGNOSIS — M797 Fibromyalgia: Secondary | ICD-10-CM | POA: Diagnosis not present

## 2019-11-29 DIAGNOSIS — G43719 Chronic migraine without aura, intractable, without status migrainosus: Secondary | ICD-10-CM | POA: Diagnosis not present

## 2020-02-07 ENCOUNTER — Other Ambulatory Visit: Payer: Self-pay

## 2020-02-09 ENCOUNTER — Other Ambulatory Visit: Payer: Self-pay

## 2020-02-09 ENCOUNTER — Encounter: Payer: Self-pay | Admitting: Family Medicine

## 2020-02-09 ENCOUNTER — Ambulatory Visit (INDEPENDENT_AMBULATORY_CARE_PROVIDER_SITE_OTHER): Payer: Medicare HMO | Admitting: Family Medicine

## 2020-02-09 VITALS — BP 117/84 | HR 71 | Temp 97.9°F | Resp 16 | Ht 64.5 in | Wt 162.0 lb

## 2020-02-09 DIAGNOSIS — I10 Essential (primary) hypertension: Secondary | ICD-10-CM | POA: Diagnosis not present

## 2020-02-09 DIAGNOSIS — M7602 Gluteal tendinitis, left hip: Secondary | ICD-10-CM

## 2020-02-09 DIAGNOSIS — M7918 Myalgia, other site: Secondary | ICD-10-CM

## 2020-02-09 DIAGNOSIS — T148XXA Other injury of unspecified body region, initial encounter: Secondary | ICD-10-CM

## 2020-02-09 MED ORDER — PREDNISONE 10 MG PO TABS: ORAL_TABLET | ORAL | 0 refills | Status: DC

## 2020-02-09 NOTE — Progress Notes (Signed)
OFFICE VISIT  02/09/2020   CC:  Chief Complaint  Patient presents with  . Follow-up    RCI, pt is not fasting   HPI:    Patient is a 54 y.o. Caucasian female who presents for 6 mo f/u HTN and fibromyalgia with chronic fatigue and new c/o pain in L glut region.  She sees Dr. Kelli Hope at [facility] neurolog[facility].  He rx's all her controlled substances (soma and [place]). She last saw him 11/2020, says he may start her on nurtec.  This is too cost prohibitive for her. Savella, relpax, ambien, zonasimide).  Takes advil 2 tabs once a day.  HTN: home monitoring, systolics around 120-125, diast upper 80s.  Checking bp once per week. Exercise: no.  Blames her myalgias and fatigue.  Now feeling lazy and prioritizes other things in life over this.  Onset about 3 mo ago feels a pulling-type pain in L side of neck/stiff, now is in L mid and lower back extending into L hip and buttock region on L.  Difficult to tell the timing of each pain and whether they seem to be connected or not. Hurts a lot in these areas when she rolls over and when bending L spine forward.  Worse when sitting and lying on left side.  No strain, trauma, or fall prior onset of her pain. Trying heat to the area plus salonpas but not much help. No pain radiating down larms or legs, no paresthesias.  No arms or legs weakness.  Bowel and bladder control intact.  No saddle anesthesia.  ROS: no fever, no joint swelling, no CP, no SOB, no wheezing, no cough, no dizziness, no HAs, no rashes, no melena/hematochezia.  No polyuria or polydipsia.     Past Medical History:  Diagnosis Date  . Allergic rhinitis   . Cervical spondylosis   . Fibromyalgia   . Heart defect    PFO--closure procedure approx 2009 (Dr. Jacinto Halim)  . Hypertension   . IFG (impaired fasting glucose) 08/2019   Gluc 106->A1c 5.6%  . Insomnia   . Migraine syndrome   . Scoliosis     Past Surgical History:  Procedure Laterality Date  . APPENDECTOMY  6th grade  .  CARDIAC SURGERY     PFO closure 2009  . CERVICAL SPINE SURGERY  remote past   discectomy x 1, then discectomy with titanium fusion  . CHOLECYSTECTOMY  prior to 2010  . COLONOSCOPY     "1990s" --reason unknown  . FOOT SURGERY  approx 1990   benign mass removed from bottom of foot  . PFTs  01/13/2019   Minimal obstructive airway dz; no response to bronchodilator    Outpatient Medications Prior to Visit  Medication Sig Dispense Refill  . carisoprodol (SOMA) 350 MG tablet Take 350 mg by mouth 4 (four) times daily as needed.      . cetirizine (ZYRTEC) 10 MG tablet Take 10 mg by mouth daily.    Marland Kitchen eletriptan (RELPAX) 20 MG tablet One tablet by mouth as needed for migraine headache.  If the headache improves and then returns, dose may be repeated after 2 hours have elapsed since first dose (do not exceed 80 mg per day). may repeat in 2 hours if necessary     . fluticasone (FLONASE) 50 MCG/ACT nasal spray Place into both nostrils daily.    Marland Kitchen ibuprofen (ADVIL,MOTRIN) 200 MG tablet Take 200 mg by mouth every 6 (six) hours as needed.    . magnesium gluconate (MAGONATE) 500 MG  tablet Take 500 mg by mouth 2 (two) times daily.    . Melatonin 5 MG TABS Take 1 tablet by mouth daily as needed.     . metoprolol succinate (TOPROL-XL) 100 MG 24 hr tablet Take with or immediately following a meal. 90 tablet 1  . Milnacipran HCl (SAVELLA) 25 MG TABS Take 25 mg by mouth 2 (two) times daily.    . naratriptan (AMERGE) 2.5 MG tablet Take 2.5 mg by mouth as needed for migraine. Take one (1) tablet at onset of headache; if returns or does not resolve, may repeat after 4 hours; do not exceed five (5) mg in 24 hours.    Marland Kitchen zolpidem (AMBIEN CR) 12.5 MG CR tablet Take 12.5 mg by mouth at bedtime as needed for sleep.     Marland Kitchen zonisamide (ZONEGRAN) 100 MG capsule Take 100 mg by mouth daily.    . promethazine (PHENERGAN) 25 MG tablet Take 1 tablet (25 mg total) by mouth every 6 (six) hours as needed for nausea or vomiting.  (Patient not taking: Reported on 04/28/2019) 30 tablet 0   No facility-administered medications prior to visit.    No Known Allergies  ROS As per HPI  PE: Blood pressure 117/84, pulse 71, temperature 97.9 F (36.6 C), temperature source Temporal, resp. rate 16, height 5' 4.5" (1.638 m), weight 162 lb (73.5 kg), SpO2 95 %. Body mass index is 27.38 kg/m. Exam chaperoned by Daryel November, LPN.  Gen: Alert, well appearing.  Patient is oriented to person, place, time, and situation. AFFECT: pleasant, lucid thought and speech. Neck: ROM stiff in all motions, some mild limitation in rotation to L and R.  Full flexion and extension of neck w/out pain. Pt moves all areas pretty slow in general.  No neck, back, or hip tenderness.  She does admit to some vague discomfort with palpation of L SI region and in L glut near the ischial tuberosity.  Some pain/signif pulling is elicited in the glut region with hip flexion.  Sitting SLR neg bilat.  LE strength 5/5 prox/dist bilat.  Patellar DTRs 1+ bilat.  Achilles DTRs absent bilat.   FABER neg bilat.  LABS:  Lab Results  Component Value Date   TSH 1.28 08/11/2019   Lab Results  Component Value Date   WBC 6.9 08/11/2019   HGB 13.8 08/11/2019   HCT 40.7 08/11/2019   MCV 94.0 08/11/2019   PLT 315.0 08/11/2019   Lab Results  Component Value Date   CREATININE 1.03 08/11/2019   BUN 16 08/11/2019   NA 139 08/11/2019   K 4.6 08/11/2019   CL 108 08/11/2019   CO2 25 08/11/2019   Lab Results  Component Value Date   ALT 15 08/11/2019   AST 12 08/11/2019   ALKPHOS 72 08/11/2019   BILITOT 0.3 08/11/2019   Lab Results  Component Value Date   CHOL 171 08/11/2019   Lab Results  Component Value Date   HDL 43.30 08/11/2019   Lab Results  Component Value Date   LDLCALC 108 (H) 08/11/2019   Lab Results  Component Value Date   TRIG 98.0 08/11/2019   Lab Results  Component Value Date   CHOLHDL 4 08/11/2019   Lab Results  Component  Value Date   HGBA1C 5.6 08/12/2019    IMPRESSION AND PLAN:  1) Muscular pain/strain; question of gluteal mm tendonitis, possibly mild sciatica.  No sign of DDD/HNP/spinal nerve compression.  Neck seems to have simple tightening of the SCM muscles causing  some stiffness. She is largely a sedentary person and these types of musculoskeletal pains are likely to take a long time to resolve. I strongly encouraged PT AND get into a regular regimen of exercise and stretching at home. I'll do prednisone 40mg  qd x 3d, then 20mg  qd x 3d, then 10mg  qd x 3d. No NSAIDs at this time.  2) HTN: fairly well controlled based on the limited bp data we have from her. No changes today.  An After Visit Summary was printed and given to the patient.  FOLLOW UP: Return in about 4 weeks (around 03/08/2020) for f/u L gluteal pain.  Signed:  Crissie Sickles, MD           02/09/2020

## 2020-02-20 ENCOUNTER — Other Ambulatory Visit: Payer: Self-pay

## 2020-02-20 ENCOUNTER — Encounter: Payer: Self-pay | Admitting: Rehabilitative and Restorative Service Providers"

## 2020-02-20 ENCOUNTER — Ambulatory Visit: Payer: Medicare HMO | Admitting: Rehabilitative and Restorative Service Providers"

## 2020-02-20 DIAGNOSIS — M76892 Other specified enthesopathies of left lower limb, excluding foot: Secondary | ICD-10-CM | POA: Diagnosis not present

## 2020-02-20 DIAGNOSIS — M25552 Pain in left hip: Secondary | ICD-10-CM

## 2020-02-20 DIAGNOSIS — R29898 Other symptoms and signs involving the musculoskeletal system: Secondary | ICD-10-CM

## 2020-02-20 DIAGNOSIS — R2689 Other abnormalities of gait and mobility: Secondary | ICD-10-CM

## 2020-02-20 NOTE — Patient Instructions (Signed)
Access Code: [facility] URL: [email or web address] Date: 02/20/2020 Prepared by: Corlis Leak  Exercises Supine Transversus Abdominis Bracing - Hands on Ground - 2 x daily - 7 x weekly - 1 sets - 10 reps - 10 sec hold Cobra - 2 x daily - 7 x weekly - 1 sets - 10 reps - 2 sec hold Prone Quadriceps Stretch with Strap - 2 x daily - 7 x weekly - 3 reps - 1 sets - 30 sec hold Supine Piriformis Stretch with Leg Straight - 2 x daily - 7 x weekly - 3 reps - 1 sets - 30 sec hold Seated Hip Flexor Stretch - 2 x daily - 7 x weekly - 3 reps - 1 sets - 30 sec hold  Patient Education [facility] doctor

## 2020-02-20 NOTE — Therapy (Signed)
[facility] [place] [address] [place], [place], [address] Phone: [phone]   Fax:  [phone]  Physical Therapy Evaluation  Patient Details  Name: Courtney Haynes MRN: [id number] Date of Birth: 02-26-1966 Referring Provider (PT): Dr Earley Favor   Encounter Date: 02/20/2020  PT End of Session - 02/20/20 1255    Visit Number  1    Number of Visits  12    Date for PT Re-Evaluation  04/02/20    PT Start Time  1149    PT Stop Time  1247    PT Time Calculation (min)  58 min    Activity Tolerance  Patient tolerated treatment well       Past Medical History:  Diagnosis Date  . Allergic rhinitis   . Cervical spondylosis   . Fibromyalgia   . Heart defect    PFO--closure procedure approx 2009 (Dr. Jacinto Halim)  . Hypertension   . IFG (impaired fasting glucose) 08/2019   Gluc 106->A1c 5.6%  . Insomnia   . Migraine syndrome   . Scoliosis     Past Surgical History:  Procedure Laterality Date  . APPENDECTOMY  6th grade  . CARDIAC SURGERY     PFO closure 2009  . CERVICAL SPINE SURGERY  remote past   discectomy x 1, then discectomy with titanium fusion  . CHOLECYSTECTOMY  prior to 2010  . COLONOSCOPY     "1990s" --reason unknown  . FOOT SURGERY  approx 1990   benign mass removed from bottom of foot  . PFTs  01/13/2019   Minimal obstructive airway dz; no response to bronchodilator    There were no vitals filed for this visit.   Subjective Assessment - 02/20/20 1155    Subjective  Patient reports that she has been having Lt hip for the past 3-4 months with no known injury. Symptoms have progressively worsened. She is having pain in the LB and into the Lt hip.    Pertinent History  two cervical disc surgeries; scoliosis; HTN; migraines; "holes" in her heart - partially repaired; arthritis; insomnia; gall bladder surgery; appendectomy    Patient Stated Goals  get rid of the back and hip pain - get moving    Currently in  Pain?  Yes    Pain Score  7     Pain Location  Buttocks    Pain Orientation  Left    Pain Descriptors / Indicators  Aching;Tightness;Burning;Sharp    Pain Type  Acute pain    Pain Onset  More than a month ago    Pain Frequency  Intermittent    Aggravating Factors   lying on back or Lt side; sitting; lifting    Pain Relieving Factors  standing; topical patch helps a little         [facility] PT Assessment - 02/20/20 0001      Assessment   Medical Diagnosis  Lt LB/hip pain/dysfunction     Referring Provider (PT)  Dr Aneta Mins McGowen    Onset Date/Surgical Date  12/09/19   about 3-4 months ago    Hand Dominance  Right    Next MD Visit  ~ 3 weeks     Prior Therapy  now for current problem       Precautions   Precautions  None      Balance Screen   Has the patient fallen in the past 6 months  No    Has the patient had a decrease in activity level because  of a fear of falling?   No    Is the patient reluctant to leave their home because of a fear of falling?   No      Home Environment   Living Environment  Private residence    Living Arrangements  Children    Type of Home  House    Home Access  Stairs to enter    Entrance Stairs-Number of Steps  6 or 7    Entrance Stairs-Rails  Can reach both    Home Layout  Two level      Prior Function   Level of Independence  Independent    Vocation  On disability   5 years    Vocation Requirements  cooking; household chores; caring for 52 year old     Leisure  4 dogs/1 cat; helps parents who are in their 26's       Observation/Other Assessments   Focus on Therapeutic Outcomes (FOTO)   62% limitation       Sensation   Additional Comments  WFL's per pt report       Posture/Postural Control   Posture Comments  head forward; flexed forward at hips; wt shifted to the Rt       AROM   Right/Left Hip  --   tightness Lt hip all end ranges    Lumbar Flexion  60% pulling in the Lt posterior hip    Lumbar Extension  35% discomfort      Lumbar - Right Side Bend  70% pulling Lt hip area    Lumbar - Left Side Bend  80%    Lumbar - Right Rotation  25% tight    Lumbar - Left Rotation  20% tight       Strength   Overall Strength Comments  functional strength bilat LE's decreased Lt hip ext/abd/flex strength due to pain - not accurately assessed       Flexibility   Hamstrings  tight Lt     Quadriceps  tight Lt > Rt    ITB  tight Lt     Piriformis  tight Lt > Rt       Palpation   Spinal mobility  discomfort and decreased mobility PA mobs L3/4/5     Palpation comment  muscular tightness through Lt psoas; posterior/lateral hip       Ambulation/Gait   Gait Comments  antalgic gait with limp on the Lt       Balance   Balance Assessed  --   SLS 10-15 sec with good balance               Objective measurements completed on examination: See above findings.      [facility] Adult PT Treatment/Exercise - 02/20/20 0001      Therapeutic Activites    Therapeutic Activities  --   myofacial ball release work Lt ant/post hip      Lumbar Exercises: Stretches   Hip Flexor Stretch  Right;Left;2 reps;30 seconds   seated   Press Ups  5 reps   2-3 sec hold    Piriformis Stretch  Left;2 reps;30 seconds   supine travell      Lumbar Exercises: Supine   AB Set Limitations  4 part core 10 sec x 10 reps - tends to hold breath              PT Education - 02/20/20 1243    Education Details  HEP TENS DN body mechanics    Person(s)  Educated  Patient    Methods  Explanation;Demonstration;Tactile cues;Verbal cues;Handout    Comprehension  Verbalized understanding;Returned demonstration;Verbal cues required;Tactile cues required          PT Long Term Goals - 02/20/20 1301      PT LONG TERM GOAL #1   Title  Improve posture and alignment with patient to demonstrate upright sitting and standing without weight shift to the Rt    Time  6    Period  Weeks    Status  New    Target Date  04/02/20      PT LONG TERM GOAL  #2   Title  Increase trunk and Lt LE ROM to WFL's and pain free    Time  6    Period  Weeks    Status  New    Target Date  04/02/20      PT LONG TERM GOAL #3   Title  Decrease pain by 75-100% allowing patient to return to all normal functional activities    Time  6    Period  Weeks    Status  New    Target Date  04/02/20      PT LONG TERM GOAL #4   Title  Independent in HEP    Time  6    Period  Weeks    Status  New    Target Date  04/02/20      PT LONG TERM GOAL #5   Title  Improve FOTO to </=44% limitation    Time  6    Period  Weeks    Status  New    Target Date  04/02/20             Plan - 02/20/20 1256    Clinical Impression Statement  Patient presents with 3-4 month history of intermittent sharp pain in the Lt posterior lateral hip with symtpoms gradually increasing. She has poor posture and alignment; limited trunk and LE mobility/ROM; muscular tightness to palpation; antalgic gait; decreased functional activity level. She will benefit from PT to address problems identified.    Stability/Clinical Decision Making  Stable/Uncomplicated    Clinical Decision Making  Low    Rehab Potential  Good    PT Frequency  2x / week    PT Duration  6 weeks    PT Treatment/Interventions  ADLs/Self Care Home Management;Patient/family education;Cryotherapy;Electrical Stimulation;Iontophoresis 4mg /ml Dexamethasone;Moist Heat;Traction;Ultrasound;Gait training;Functional mobility training;Therapeutic activities;Therapeutic exercise;Neuromuscular re-education;Manual techniques;Dry needling;Taping    PT Next Visit Plan  review HEP; progress with exercises(lateral trunk flexion stretch, HS stretch, hinged hip sit to stand, ?wall slide) possible manual work vs DN; modalities as indicated; back care education    PT Home Exercise Plan  [facility]    Consulted and Agree with Plan of Care  Patient       Patient will benefit from skilled therapeutic intervention in order to improve the  following deficits and impairments:  Decreased range of motion, Increased fascial restricitons, Decreased activity tolerance, Decreased mobility, Pain  Visit Diagnosis: Tendonitis of left hip - Plan: PT plan of care cert/re-cert  Other symptoms and signs involving the musculoskeletal system - Plan: PT plan of care cert/re-cert  Pain in left hip - Plan: PT plan of care cert/re-cert  Other abnormalities of gait and mobility - Plan: PT plan of care cert/re-cert     Problem List Patient Active Problem List   Diagnosis Date Noted  . Fibromyalgia 03/25/2012  . Essential hypertension 01/30/2009  . Migraine, unspecified, not  intractable, without status migrainosus 01/30/2009  . Myalgia and myositis, unspecified 01/30/2009  . Neuromyositis 01/30/2009  . Thrombophlebitis of superficial veins of lower extremity 01/30/2009    Margerite Impastato Rober Minion PT, MPH  02/20/2020, 1:10 PM  [facility] [address] [place], [place], [address] Phone: [phone]   Fax:  [phone]  Name: Courtney Haynes MRN: [id number] Date of Birth: May 10, 1966

## 2020-02-22 ENCOUNTER — Encounter: Payer: Self-pay | Admitting: Physical Therapy

## 2020-02-22 ENCOUNTER — Ambulatory Visit (INDEPENDENT_AMBULATORY_CARE_PROVIDER_SITE_OTHER): Payer: Medicare HMO | Admitting: Physical Therapy

## 2020-02-22 ENCOUNTER — Other Ambulatory Visit: Payer: Self-pay

## 2020-02-22 DIAGNOSIS — M25552 Pain in left hip: Secondary | ICD-10-CM

## 2020-02-22 DIAGNOSIS — M76892 Other specified enthesopathies of left lower limb, excluding foot: Secondary | ICD-10-CM

## 2020-02-22 DIAGNOSIS — R29898 Other symptoms and signs involving the musculoskeletal system: Secondary | ICD-10-CM

## 2020-02-22 NOTE — Therapy (Signed)
[facility] [place] [address] [place], [place], [address] Phone: [phone]   Fax:  [phone]  Physical Therapy Treatment  Patient Details  Name: Courtney Haynes MRN: [id number] Date of Birth: 08-14-1966 Referring Provider (PT): Dr Earley Favor   Encounter Date: 02/22/2020  PT End of Session - 02/22/20 1108    Visit Number  2    Number of Visits  12    Date for PT Re-Evaluation  04/02/20    PT Start Time  1104    PT Stop Time  1147    PT Time Calculation (min)  43 min    Activity Tolerance  Patient tolerated treatment well       Past Medical History:  Diagnosis Date  . Allergic rhinitis   . Cervical spondylosis   . Fibromyalgia   . Heart defect    PFO--closure procedure approx 2009 (Dr. Jacinto Halim)  . Hypertension   . IFG (impaired fasting glucose) 08/2019   Gluc 106->A1c 5.6%  . Insomnia   . Migraine syndrome   . Scoliosis     Past Surgical History:  Procedure Laterality Date  . APPENDECTOMY  6th grade  . CARDIAC SURGERY     PFO closure 2009  . CERVICAL SPINE SURGERY  remote past   discectomy x 1, then discectomy with titanium fusion  . CHOLECYSTECTOMY  prior to 2010  . COLONOSCOPY     "1990s" --reason unknown  . FOOT SURGERY  approx 1990   benign mass removed from bottom of foot  . PFTs  01/13/2019   Minimal obstructive airway dz; no response to bronchodilator    There were no vitals filed for this visit.  Subjective Assessment - 02/22/20 1109    Subjective  Pt reports last night was rough; unable to lay on Lt side to sleep.  She states her Lt hip "kept stinging" while she was sitting eating dinner.    Pertinent History  two cervical disc surgeries; scoliosis; HTN; migraines; "holes" in her heart - partially repaired; arthritis; insomnia; gall bladder surgery; appendectomy    Patient Stated Goals  get rid of the back and hip pain - get moving    Currently in Pain?  Yes    Pain Score  5     Pain  Location  Hip    Pain Orientation  Left    Pain Descriptors / Indicators  Sore    Pain Onset  More than a month ago    Aggravating Factors   lying on side    Pain Relieving Factors  ?         [facility] PT Assessment - 02/22/20 0001      Assessment   Medical Diagnosis  Lt LB/hip pain/dysfunction     Referring Provider (PT)  Dr Aneta Mins McGowen    Onset Date/Surgical Date  12/09/19   about 3-4 months ago    Hand Dominance  Right    Next MD Visit  ~ 3 weeks     Prior Therapy  now for current problem        [facility] Adult PT Treatment/Exercise - 02/22/20 0001      Self-Care   Self-Care  Other Self-Care Comments -reviewed use of TENS and heat for pain management (parameters and electrode placement), verbally reviewed self massage.      Lumbar Exercises: Stretches   Lower Trunk Rotation  3 reps;10 seconds   range to tolerance   Hip Flexor Stretch  Left;1 rep;20 seconds  seated   Prone on Elbows Stretch  3 reps;20 seconds    Press Ups  3 reps;5 seconds   unable to tolerate   Quad Stretch  Left;2 reps;30 seconds    Piriformis Stretch  Left;3 reps;Right;1 rep;30 seconds   travell, supine, and sitting    Piriformis Stretch Limitations  --    Figure 4 Stretch  1 rep;20 seconds      Lumbar Exercises: Seated   Sit to Stand  5 reps   with TA engaged.      Lumbar Exercises: Supine   Ab Set  5 reps   tactile cues; cues for speed of counting     Lumbar Exercises: Sidelying   Other Sidelying Lumbar Exercises  Open book with Lt thoracic rotation x5 reps      Lumbar Exercises: Quadruped   Other Quadruped Lumbar Exercises  childs pose with / without Rt lateral trunk flexion x 10 sec       Manual Therapy   Manual Therapy  Myofascial release    Myofascial Release  MFR to Lt transversus abdominus fascia       Aerobic:  Treadmill x 4 min at 2.3-2.5 mph for warm up.        PT Education - 02/22/20 1306    Education Details  HEP - switched to POE (instead of cobra);  issued samples  of biofreeze.  verbally reviewed ball self-massage for hip.    Person(s) Educated  Patient    Methods  Explanation    Comprehension  Verbalized understanding          PT Long Term Goals - 02/20/20 1301      PT LONG TERM GOAL #1   Title  Improve posture and alignment with patient to demonstrate upright sitting and standing without weight shift to the Rt    Time  6    Period  Weeks    Status  New    Target Date  04/02/20      PT LONG TERM GOAL #2   Title  Increase trunk and Lt LE ROM to WFL's and pain free    Time  6    Period  Weeks    Status  New    Target Date  04/02/20      PT LONG TERM GOAL #3   Title  Decrease pain by 75-100% allowing patient to return to all normal functional activities    Time  6    Period  Weeks    Status  New    Target Date  04/02/20      PT LONG TERM GOAL #4   Title  Independent in HEP    Time  6    Period  Weeks    Status  New    Target Date  04/02/20      PT LONG TERM GOAL #5   Title  Improve FOTO to </=44% limitation    Time  6    Period  Weeks    Status  New    Target Date  04/02/20            Plan - 02/22/20 1135    Clinical Impression Statement  Pt had reports of pain/ stretching along lateral trunk during exercises. She did not tolerate press ups well, with report of increased LBP; switched to POE with improved tolerance.  Pt reported reduction of discomfort in hip/LB at end of session.  Progressing towards goals.    Stability/Clinical Decision Making  Stable/Uncomplicated    Rehab Potential  Good    PT Frequency  2x / week    PT Duration  6 weeks    PT Treatment/Interventions  ADLs/Self Care Home Management;Patient/family education;Cryotherapy;Electrical Stimulation;Iontophoresis 4mg /ml Dexamethasone;Moist Heat;Traction;Ultrasound;Gait training;Functional mobility training;Therapeutic activities;Therapeutic exercise;Neuromuscular re-education;Manual techniques;Dry needling;Taping    PT Next Visit Plan  review HEP; progress  with exercises(lateral trunk flexion stretch, HS stretch, hinged hip sit to stand, ?wall slide) possible manual work vs DN; modalities as indicated; back care education    PT Home Exercise Plan  [facility]    Consulted and Agree with Plan of Care  Patient       Patient will benefit from skilled therapeutic intervention in order to improve the following deficits and impairments:  Decreased range of motion, Increased fascial restricitons, Decreased activity tolerance, Decreased mobility, Pain  Visit Diagnosis: Tendonitis of left hip  Other symptoms and signs involving the musculoskeletal system  Pain in left hip     Problem List Patient Active Problem List   Diagnosis Date Noted  . Fibromyalgia 03/25/2012  . Essential hypertension 01/30/2009  . Migraine, unspecified, not intractable, without status migrainosus 01/30/2009  . Myalgia and myositis, unspecified 01/30/2009  . Neuromyositis 01/30/2009  . Thrombophlebitis of superficial veins of lower extremity 01/30/2009   02/01/2009, PTA 02/22/20 1:16 PM  [facility] [address] [place], [place], [place] Phone: [phone]   Fax:  [phone]  Name: Courtney Haynes MRN: Merri Brunette Date of Birth: [birth year]

## 2020-02-27 ENCOUNTER — Encounter: Payer: Medicare HMO | Admitting: Physical Therapy

## 2020-03-01 ENCOUNTER — Telehealth: Payer: Self-pay

## 2020-03-01 ENCOUNTER — Encounter: Payer: Medicare HMO | Admitting: Physical Therapy

## 2020-03-01 ENCOUNTER — Telehealth: Payer: Self-pay | Admitting: Family Medicine

## 2020-03-01 DIAGNOSIS — Z03818 Encounter for observation for suspected exposure to other biological agents ruled out: Secondary | ICD-10-CM | POA: Diagnosis not present

## 2020-03-01 DIAGNOSIS — Z20828 Contact with and (suspected) exposure to other viral communicable diseases: Secondary | ICD-10-CM | POA: Diagnosis not present

## 2020-03-01 NOTE — Telephone Encounter (Signed)
Note written and put on your desk.

## 2020-03-01 NOTE — Telephone Encounter (Signed)
Patient was last seen on 02/09/20 for gluteal pain and recommended she start PT. Pt followed recommendations and is still having PT currently. She is requesting note to be excused from jury duty.  Please advise if okay for note?

## 2020-03-01 NOTE — Telephone Encounter (Signed)
Opened to type letter

## 2020-03-01 NOTE — Telephone Encounter (Signed)
Patient called to report she got summoned for [employer].  Patient is currently in physical therapy. Patient is requesting a note to have her excused from [place] [employer] at this time.  Please call patient at [phone].

## 2020-03-02 NOTE — Telephone Encounter (Signed)
Tried to reach patient, unable to leave message. Note will remain on my desk until patient reached

## 2020-03-02 NOTE — Telephone Encounter (Signed)
Patient returned call and advised note is completed. She will come by and pick up on Monday. Placed up front

## 2020-03-05 ENCOUNTER — Encounter: Payer: Self-pay | Admitting: Rehabilitative and Restorative Service Providers"

## 2020-03-05 ENCOUNTER — Ambulatory Visit (INDEPENDENT_AMBULATORY_CARE_PROVIDER_SITE_OTHER): Payer: Medicare HMO | Admitting: Rehabilitative and Restorative Service Providers"

## 2020-03-05 ENCOUNTER — Other Ambulatory Visit: Payer: Self-pay

## 2020-03-05 DIAGNOSIS — R2689 Other abnormalities of gait and mobility: Secondary | ICD-10-CM | POA: Diagnosis not present

## 2020-03-05 DIAGNOSIS — R29898 Other symptoms and signs involving the musculoskeletal system: Secondary | ICD-10-CM

## 2020-03-05 DIAGNOSIS — M25552 Pain in left hip: Secondary | ICD-10-CM

## 2020-03-05 DIAGNOSIS — M76892 Other specified enthesopathies of left lower limb, excluding foot: Secondary | ICD-10-CM | POA: Diagnosis not present

## 2020-03-05 NOTE — Therapy (Addendum)
[facility] [place] [address] [place], [place], [address] Phone: [phone]   Fax:  [phone]  Physical Therapy Treatment  Patient Details  Name: Courtney Haynes MRN: [id number] Date of Birth: [birth year] Referring Provider (PT): Dr Earley Favor   Encounter Date: 03/05/2020  PT End of Session - 03/05/20 1107    Visit Number  3    Number of Visits  12    Date for PT Re-Evaluation  04/02/20    PT Start Time  1102    PT Stop Time  1148    PT Time Calculation (min)  46 min    Activity Tolerance  Patient tolerated treatment well       Past Medical History:  Diagnosis Date  . Allergic rhinitis   . Cervical spondylosis   . Fibromyalgia   . Heart defect    PFO--closure procedure approx 2009 (Dr. Jacinto Halim)  . Hypertension   . IFG (impaired fasting glucose) 08/2019   Gluc 106->A1c 5.6%  . Insomnia   . Migraine syndrome   . Scoliosis     Past Surgical History:  Procedure Laterality Date  . APPENDECTOMY  6th grade  . CARDIAC SURGERY     PFO closure 2009  . CERVICAL SPINE SURGERY  remote past   discectomy x 1, then discectomy with titanium fusion  . CHOLECYSTECTOMY  prior to 2010  . COLONOSCOPY     "1990s" --reason unknown  . FOOT SURGERY  approx 1990   benign mass removed from bottom of foot  . PFTs  01/13/2019   Minimal obstructive airway dz; no response to bronchodilator    There were no vitals filed for this visit.  Subjective Assessment - 03/05/20 1107    Subjective  Patient has been sick with a stomach virus for several days and unable to exercise. She does note that her hip is less painful and she can lie on her Lt hip some now.    Currently in Pain?  Yes    Pain Score  3     Pain Location  Hip    Pain Orientation  Left    Pain Descriptors / Indicators  Sore    Pain Type  Acute pain    Pain Onset  More than a month ago    Pain Frequency  Intermittent                       [facility] Adult  PT Treatment/Exercise - 03/05/20 0001      Lumbar Exercises: Stretches   Lower Trunk Rotation  3 reps;10 seconds   range to tolerance   Hip Flexor Stretch  Left;Right;3 reps;30 seconds   seated    Prone on Elbows Stretch  3 reps;20 seconds    Quad Stretch  Left;3 reps;30 seconds    Piriformis Stretch  Left;3 reps;Right;1 rep;30 seconds   travell supine   Other Lumbar Stretch Exercise  seated lateral trunk flexion 30 sec x 2 each side       Lumbar Exercises: Aerobic   Tread Mill  1.5 to 2.0 mph x 4 min       Lumbar Exercises: Seated   Sit to Stand  10 reps   with TA engaged.      Manual Therapy   Manual Therapy  Soft tissue mobilization    Manual therapy comments  deep tissue work through Lt posterior  and posterior lateral hip     Myofascial Release  Lt posterior  hip       Treatment was concluded with moist heat and e-stim to Lt posterior hip x 12 min        PT Education - 03/05/20 1129    Education Details  HEP    Person(s) Educated  Patient    Methods  Explanation;Demonstration;Tactile cues;Verbal cues;Handout    Comprehension  Verbalized understanding;Returned demonstration;Verbal cues required;Tactile cues required          PT Long Term Goals - 02/20/20 1301      PT LONG TERM GOAL #1   Title  Improve posture and alignment with patient to demonstrate upright sitting and standing without weight shift to the Rt    Time  6    Period  Weeks    Status  New    Target Date  04/02/20      PT LONG TERM GOAL #2   Title  Increase trunk and Lt LE ROM to WFL's and pain free    Time  6    Period  Weeks    Status  New    Target Date  04/02/20      PT LONG TERM GOAL #3   Title  Decrease pain by 75-100% allowing patient to return to all normal functional activities    Time  6    Period  Weeks    Status  New    Target Date  04/02/20      PT LONG TERM GOAL #4   Title  Independent in HEP    Time  6    Period  Weeks    Status  New    Target Date  04/02/20       PT LONG TERM GOAL #5   Title  Improve FOTO to </=44% limitation    Time  6    Period  Weeks    Status  New    Target Date  04/02/20            Plan - 03/05/20 1122    Clinical Impression Statement  Patient reports improvement in Lt hip pain after being sick with virus for several days. Reviewed HEP and continued with manual work focus on posterior Lt hip. Note continued tightness through the musculature of posterior hip - piriformis and glut med/min    Rehab Potential  Good    PT Frequency  2x / week    PT Duration  6 weeks    PT Treatment/Interventions  ADLs/Self Care Home Management;Patient/family education;Cryotherapy;Electrical Stimulation;Iontophoresis 4mg /ml Dexamethasone;Moist Heat;Traction;Ultrasound;Gait training;Functional mobility training;Therapeutic activities;Therapeutic exercise;Neuromuscular re-education;Manual techniques;Dry needling;Taping    PT Next Visit Plan  review HEP; progress with exercises - core stabilization(possibly wall plank; wall slide) manual work vs DN; modalities as indicated; back care education    PT Home Exercise Plan  [email or web address]    Consulted and Agree with Plan of Care  Patient       Patient will benefit from skilled therapeutic intervention in order to improve the following deficits and impairments:     Visit Diagnosis: Tendonitis of left hip  Other symptoms and signs involving the musculoskeletal system  Pain in left hip  Other abnormalities of gait and mobility     Problem List Patient Active Problem List   Diagnosis Date Noted  . Fibromyalgia 03/25/2012  . Essential hypertension 01/30/2009  . Migraine, unspecified, not intractable, without status migrainosus 01/30/2009  . Myalgia and myositis, unspecified 01/30/2009  . Neuromyositis 01/30/2009  . Thrombophlebitis of superficial veins of lower extremity 01/30/2009  Favor Kreh Rober Minion PT, MPH  03/05/2020, 11:48 AM  [facility] [address] [place], [place], [address] Phone: [phone]   Fax:  [phone]  Name: Courtney Haynes MRN: [id number] Date of Birth: May 16, 1966

## 2020-03-05 NOTE — Patient Instructions (Signed)
Access Code: [email or web address]: [email or web address]: 03/29/2021Prepared by: Koal Eslinger HoltExercises  Supine ITB Stretch with Strap - 2 x daily - 7 x weekly - 3 reps - 1 sets - 30 sec hold  Seated Sidebending - 2 x daily - 7 x weekly - 1 sets - 3 reps - 30 sec hold

## 2020-03-08 ENCOUNTER — Ambulatory Visit (INDEPENDENT_AMBULATORY_CARE_PROVIDER_SITE_OTHER): Payer: Medicare HMO | Admitting: Rehabilitative and Restorative Service Providers"

## 2020-03-08 ENCOUNTER — Other Ambulatory Visit: Payer: Self-pay

## 2020-03-08 ENCOUNTER — Encounter: Payer: Self-pay | Admitting: Rehabilitative and Restorative Service Providers"

## 2020-03-08 DIAGNOSIS — M25552 Pain in left hip: Secondary | ICD-10-CM | POA: Diagnosis not present

## 2020-03-08 DIAGNOSIS — M76892 Other specified enthesopathies of left lower limb, excluding foot: Secondary | ICD-10-CM

## 2020-03-08 DIAGNOSIS — R2689 Other abnormalities of gait and mobility: Secondary | ICD-10-CM

## 2020-03-08 DIAGNOSIS — R29898 Other symptoms and signs involving the musculoskeletal system: Secondary | ICD-10-CM | POA: Diagnosis not present

## 2020-03-08 NOTE — Therapy (Addendum)
[place], [place], [address] Phone: [phone]   Fax:  [phone]  Physical Therapy Treatment  Patient Details  Name: Courtney Haynes MRN: [id number] Date of Birth: 16-Sep-1966 Referring Provider (PT): Dr Ricardo Jericho   Encounter Date: 03/08/2020  PT End of Session - 03/08/20 1153    Visit Number  4    Number of Visits  12    Date for PT Re-Evaluation  04/02/20    PT Start Time  4680    PT Stop Time  1240    PT Time Calculation (min)  51 min    Activity Tolerance  Patient tolerated treatment well       Past Medical History:  Diagnosis Date  . Allergic rhinitis   . Cervical spondylosis   . Fibromyalgia   . Heart defect    PFO--closure procedure approx 2009 (Dr. Einar Gip)  . Hypertension   . IFG (impaired fasting glucose) 08/2019   Gluc 106->A1c 5.6%  . Insomnia   . Migraine syndrome   . Scoliosis     Past Surgical History:  Procedure Laterality Date  . APPENDECTOMY  6th grade  . CARDIAC SURGERY     PFO closure 2009  . CERVICAL SPINE SURGERY  remote past   discectomy x 1, then discectomy with titanium fusion  . CHOLECYSTECTOMY  prior to 2010  . COLONOSCOPY     "1990s" --reason unknown  . FOOT SURGERY  approx 1990   benign mass removed from bottom of foot  . PFTs  01/13/2019   Minimal obstructive airway dz; no response to bronchodilator    There were no vitals filed for this visit.  Subjective Assessment - 03/08/20 1156    Subjective  Some pain in the Lt LB yesterday and a little more this morning. Did't do anything differently that she knows of. May have slept wrong.    Currently in Pain?  Yes    Pain Score  4     Pain Location  Hip    Pain Orientation  Left    Pain Descriptors / Indicators  Sore;Aching    Pain Type  Acute pain    Pain Radiating Towards  back to hip area    Pain Onset  More than a month ago    Pain Frequency  Intermittent                        [facility] Adult PT Treatment/Exercise - 03/08/20 0001      Lumbar Exercises: Stretches   Hip Flexor Stretch Limitations  HEP     Prone on Elbows Stretch  3 reps;20 seconds    Quad Stretch  Left;3 reps;30 seconds    Piriformis Stretch  Left;3 reps;Right;1 rep;30 seconds   travell supine   Other Lumbar Stretch Exercise  seated lateral trunk flexion 30 sec x 2 each side       Lumbar Exercises: Aerobic   Nustep  L5 x 5 min UE - 9       Lumbar Exercises: Standing   Wall Slides  10 reps;5 seconds   VC to engage core    Other Standing Lumbar Exercises  wall plank 30 sec x 3       Lumbar Exercises: Seated   Sit to Stand  10 reps   with TA engaged.      Lumbar Exercises: Supine   AB Set Limitations  4 part core 10 sec x 10  reps     Bridge  --    Bridge Limitations  trial of bridging created some pain       Moist Heat Therapy   Number Minutes Moist Heat  14 Minutes    Moist Heat Location  Lumbar Spine;Hip   posterior Lt hip      Electrical Stimulation   Electrical Stimulation Location  Lt lumbar to posterior Lt hip     Electrical Stimulation Action  IFC    Electrical Stimulation Parameters  to tolerance    Electrical Stimulation Goals  Pain;Tone      Manual Therapy   Manual Therapy  Soft tissue mobilization    Manual therapy comments  deep tissue work Lt lumbar paraspinals/lats through Lt posterior  and posterior lateral hip     Soft tissue mobilization  tightness noted Lt lumbar paraspinals Lt posterior hip     Myofascial Release  Lt posterior hip              PT Education - 03/08/20 1227    Education Details  HEP    Person(s) Educated  Patient    Methods  Explanation;Demonstration;Tactile cues;Verbal cues;Handout    Comprehension  Verbalized understanding;Returned demonstration;Verbal cues required;Tactile cues required          PT Long Term Goals - 02/20/20 1301      PT LONG TERM GOAL #1   Title  Improve posture and alignment  with patient to demonstrate upright sitting and standing without weight shift to the Rt    Time  6    Period  Weeks    Status  New    Target Date  04/02/20      PT LONG TERM GOAL #2   Title  Increase trunk and Lt LE ROM to WFL's and pain free    Time  6    Period  Weeks    Status  New    Target Date  04/02/20      PT LONG TERM GOAL #3   Title  Decrease pain by 75-100% allowing patient to return to all normal functional activities    Time  6    Period  Weeks    Status  New    Target Date  04/02/20      PT LONG TERM GOAL #4   Title  Independent in HEP    Time  6    Period  Weeks    Status  New    Target Date  04/02/20      PT LONG TERM GOAL #5   Title  Improve FOTO to </=44% limitation    Time  6    Period  Weeks    Status  New    Target Date  04/02/20            Plan - 03/08/20 1153    Clinical Impression Statement  Some increase in back pain last night and more this morning. Not sure of anything she did that may have irritated her back. Muscular tightness noted through the Lt lumbar paraspinals and posterior hip musculature - glut med/min and piriformis.  Added core stabilization exercises. Continue with manual work and modalities.    Rehab Potential  Good    PT Frequency  2x / week    PT Duration  6 weeks    PT Treatment/Interventions  ADLs/Self Care Home Management;Patient/family education;Cryotherapy;Electrical Stimulation;Iontophoresis '4mg'$ /ml Dexamethasone;Moist Heat;Traction;Ultrasound;Gait training;Functional mobility training;Therapeutic activities;Therapeutic exercise;Neuromuscular re-education;Manual techniques;Dry needling;Taping    PT Next Visit Plan  review HEP; progress with exercises - core stabilization(possibly wall plank; wall slide) manual work vs DN; modalities as indicated; back care education    PT Home Exercise Plan  [email or web address]    Consulted and Agree with Plan of Care  Patient       Patient will benefit from skilled therapeutic intervention in  order to improve the following deficits and impairments:     Visit Diagnosis: Tendonitis of left hip  Other symptoms and signs involving the musculoskeletal system  Pain in left hip  Other abnormalities of gait and mobility     Problem List Patient Active Problem List   Diagnosis Date Noted  . Fibromyalgia 03/25/2012  . Essential hypertension 01/30/2009  . Migraine, unspecified, not intractable, without status migrainosus 01/30/2009  . Myalgia and myositis, unspecified 01/30/2009  . Neuromyositis 01/30/2009  . Thrombophlebitis of superficial veins of lower extremity 01/30/2009    Mariadelosang Wynns Nilda Simmer PT, MPH  03/08/2020, 12:40 PM  [facility] [place], [place], [address] Phone: [phone]   Fax:  [phone]  Name: Courtney Haynes MRN: [id number] Date of Birth: 1966/10/09  PHYSICAL THERAPY DISCHARGE SUMMARY  Visits from Start of Care: 4  Current functional level related to goals / functional outcomes: See progress note for discharge status   Remaining deficits: Would benefit from continued progression of core strengthening    Education / Equipment: HEP  Plan: Patient agrees to discharge.  Patient goals were partially met. Patient is being discharged due to not returning since the last visit.  ?????    Luc Shammas P. Helene Kelp PT, MPH 05/24/20 2:53 PM

## 2020-03-08 NOTE — Patient Instructions (Signed)
Access Code: [email or web address]: [email or web address]: 04/01/2021Prepared by: Saquoia Sianez HoltExercises  Supine Transversus Abdominis Bracing - Hands on Ground - 2 x daily - 7 x weekly - 1 sets - 10 reps - 10 sec hold  Prone Quadriceps Stretch with Strap - 2 x daily - 7 x weekly - 3 reps - 1 sets - 30 sec hold  Supine Piriformis Stretch with Leg Straight - 2 x daily - 7 x weekly - 3 reps - 1 sets - 30 sec hold  Seated Hip Flexor Stretch - 2 x daily - 7 x weekly - 3 reps - 1 sets - 30 sec hold  Prone on Elbows Stretch - 2 x daily - 7 x weekly - 1 sets - 3 reps - 10 hold  Standing Plank on Wall - 2 x daily - 7 x weekly - 3 reps - 1 sets - 30 sec hold  Wall Squat - 2 x daily - 7 x weekly - 1 sets - 10 reps - 10 sec hold

## 2020-03-14 ENCOUNTER — Encounter: Payer: Medicare HMO | Admitting: Rehabilitative and Restorative Service Providers"

## 2020-03-16 ENCOUNTER — Encounter: Payer: Medicare HMO | Admitting: Physical Therapy

## 2020-04-26 ENCOUNTER — Other Ambulatory Visit: Payer: Self-pay

## 2020-04-27 ENCOUNTER — Telehealth: Payer: Self-pay

## 2020-04-27 ENCOUNTER — Other Ambulatory Visit: Payer: Self-pay

## 2020-04-27 MED ORDER — METOPROLOL SUCCINATE ER 100 MG PO TB24: ORAL_TABLET | ORAL | 0 refills | Status: DC

## 2020-04-27 NOTE — Telephone Encounter (Signed)
Patient called in to let nurse know that medication   metoprolol succinate (TOPROL-XL) 100 MG 24 hr tablet   [id number] - [place], [place] - [address] AT [facility] [address]

## 2020-04-27 NOTE — Telephone Encounter (Signed)
Rx sent. Left detailed message, okay per dpr 

## 2020-05-28 ENCOUNTER — Emergency Department (HOSPITAL_BASED_OUTPATIENT_CLINIC_OR_DEPARTMENT_OTHER)
Admission: EM | Admit: 2020-05-28 | Discharge: 2020-05-28 | Disposition: A | Discharge status: Home / Self Care | Payer: Medicare HMO | Attending: Emergency Medicine | Admitting: Emergency Medicine

## 2020-05-28 ENCOUNTER — Encounter (HOSPITAL_BASED_OUTPATIENT_CLINIC_OR_DEPARTMENT_OTHER): Payer: Self-pay | Admitting: *Deleted

## 2020-05-28 ENCOUNTER — Other Ambulatory Visit: Payer: Self-pay

## 2020-05-28 ENCOUNTER — Emergency Department (HOSPITAL_BASED_OUTPATIENT_CLINIC_OR_DEPARTMENT_OTHER): Payer: Medicare HMO

## 2020-05-28 DIAGNOSIS — Z79899 Other long term (current) drug therapy: Secondary | ICD-10-CM | POA: Insufficient documentation

## 2020-05-28 DIAGNOSIS — I10 Essential (primary) hypertension: Secondary | ICD-10-CM | POA: Diagnosis not present

## 2020-05-28 DIAGNOSIS — R0602 Shortness of breath: Secondary | ICD-10-CM | POA: Diagnosis not present

## 2020-05-28 DIAGNOSIS — R072 Precordial pain: Secondary | ICD-10-CM | POA: Diagnosis not present

## 2020-05-28 DIAGNOSIS — R079 Chest pain, unspecified: Secondary | ICD-10-CM | POA: Diagnosis not present

## 2020-05-28 DIAGNOSIS — J811 Chronic pulmonary edema: Secondary | ICD-10-CM | POA: Diagnosis not present

## 2020-05-28 LAB — CBC WITH DIFFERENTIAL/PLATELET
Abs Immature Granulocytes: 0.01 10*3/uL (ref 0.00–0.07)
Basophils Absolute: 0 10*3/uL (ref 0.0–0.1)
Basophils Relative: 0 %
Eosinophils Absolute: 0.2 10*3/uL (ref 0.0–0.5)
Eosinophils Relative: 2 %
HCT: 42 % (ref 36.0–46.0)
Hemoglobin: 14.3 g/dL (ref 12.0–15.0)
Immature Granulocytes: 0 %
Lymphocytes Relative: 33 %
Lymphs Abs: 2.4 10*3/uL (ref 0.7–4.0)
MCH: 31.6 pg (ref 26.0–34.0)
MCHC: 34 g/dL (ref 30.0–36.0)
MCV: 92.9 fL (ref 80.0–100.0)
Monocytes Absolute: 0.6 10*3/uL (ref 0.1–1.0)
Monocytes Relative: 8 %
Neutro Abs: 4 10*3/uL (ref 1.7–7.7)
Neutrophils Relative %: 57 %
Platelets: 333 10*3/uL (ref 150–400)
RBC: 4.52 MIL/uL (ref 3.87–5.11)
RDW: 12.9 % (ref 11.5–15.5)
WBC: 7.1 10*3/uL (ref 4.0–10.5)
nRBC: 0 % (ref 0.0–0.2)

## 2020-05-28 LAB — COMPREHENSIVE METABOLIC PANEL
ALT: 19 U/L (ref 0–44)
AST: 16 U/L (ref 15–41)
Albumin: 4.4 g/dL (ref 3.5–5.0)
Alkaline Phosphatase: 82 U/L (ref 38–126)
Anion gap: 9 (ref 5–15)
BUN: 17 mg/dL (ref 6–20)
CO2: 24 mmol/L (ref 22–32)
Calcium: 9.2 mg/dL (ref 8.9–10.3)
Chloride: 105 mmol/L (ref 98–111)
Creatinine, Ser: 1.06 mg/dL — ABNORMAL HIGH (ref 0.44–1.00)
GFR calc Af Amer: >60 mL/min (ref >60)
GFR calc non Af Amer: >60 mL/min (ref >60)
Glucose, Bld: 103 mg/dL — ABNORMAL HIGH (ref 70–99)
Potassium: 4.1 mmol/L (ref 3.5–5.1)
Sodium: 138 mmol/L (ref 135–145)
Total Bilirubin: 0.5 mg/dL (ref 0.3–1.2)
Total Protein: 8 g/dL (ref 6.5–8.1)

## 2020-05-28 LAB — PREGNANCY, URINE: Preg Test, Ur: NEGATIVE

## 2020-05-28 LAB — D-DIMER, QUANTITATIVE: D-Dimer, Quant: <0.27 ug/mL-FEU (ref 0.00–0.50)

## 2020-05-28 LAB — TROPONIN I (HIGH SENSITIVITY)
Troponin I (High Sensitivity): 2 ng/L (ref <18)
Troponin I (High Sensitivity): <2 ng/L (ref <18)

## 2020-05-28 MED ORDER — SODIUM CHLORIDE 0.9 % IV SOLN: INTRAVENOUS | Status: DC

## 2020-05-28 MED ORDER — SODIUM CHLORIDE 0.9 % IV BOLUS
500.0000 mL | Freq: Once | INTRAVENOUS | Status: AC
  Administered 2020-05-28: 500 mL via INTRAVENOUS

## 2020-05-28 MED ORDER — ONDANSETRON HCL 4 MG/2ML IJ SOLN
4.0000 mg | Freq: Once | INTRAMUSCULAR | Status: AC
  Administered 2020-05-28: 4 mg via INTRAVENOUS
  Filled 2020-05-28: qty 2

## 2020-05-28 MED ORDER — HYDROMORPHONE HCL 1 MG/ML IJ SOLN
1.0000 mg | Freq: Once | INTRAMUSCULAR | Status: AC
  Administered 2020-05-28: 1 mg via INTRAVENOUS
  Filled 2020-05-28: qty 1

## 2020-05-28 MED ORDER — HYDROCODONE-ACETAMINOPHEN 5-325 MG PO TABS: 1.0000 | ORAL_TABLET | Freq: Four times a day (QID) | ORAL | 0 refills | Status: DC | PRN

## 2020-05-28 NOTE — ED Triage Notes (Signed)
C/o left sided cp x 2 days, SOb x 2 months .

## 2020-05-28 NOTE — ED Provider Notes (Signed)
[facility] [place] EMERGENCY DEPARTMENT Provider Note   CSN: [id number] Arrival date & time: 05/28/20  1359     History Chief Complaint  Patient presents with  . Chest Pain    Courtney Haynes is a 54 y.o. female.  Patient presents today with a 69-month history of shortness of breath.  Her mother died from blood clots.  Patient also started with constant chest pain substernal radiating the left arm 2 days ago.  Patient's past medical history significant for fibromyalgia.  Known septal heart defect.  Which was repaired.  Non-smoker.  History of migraines.  History of hypertension.  No known history of coronary artery disease.        Past Medical History:  Diagnosis Date  . Allergic rhinitis   . Cervical spondylosis   . Fibromyalgia   . Heart defect    PFO--closure procedure approx 2009 (Dr. Jacinto Halim)  . Hypertension   . IFG (impaired fasting glucose) 08/2019   Gluc 106->A1c 5.6%  . Insomnia   . Migraine syndrome   . Scoliosis     Patient Active Problem List   Diagnosis Date Noted  . Fibromyalgia 03/25/2012  . Essential hypertension 01/30/2009  . Migraine, unspecified, not intractable, without status migrainosus 01/30/2009  . Myalgia and myositis, unspecified 01/30/2009  . Neuromyositis 01/30/2009  . Thrombophlebitis of superficial veins of lower extremity 01/30/2009    Past Surgical History:  Procedure Laterality Date  . APPENDECTOMY  6th grade  . CARDIAC SURGERY     PFO closure 2009  . CERVICAL SPINE SURGERY  remote past   discectomy x 1, then discectomy with titanium fusion  . CHOLECYSTECTOMY  prior to 2010  . COLONOSCOPY     "1990s" --reason unknown  . FOOT SURGERY  approx 1990   benign mass removed from bottom of foot  . PFTs  01/13/2019   Minimal obstructive airway dz; no response to bronchodilator     OB History   No obstetric history on file.     Family History  Problem Relation Age of Onset  . Heart disease Father   . Diabetes Father   .  Kidney disease Father   . Stroke Father     Social History   Tobacco Use  . Smoking status: Never Smoker  . Smokeless tobacco: Never Used  Substance Use Topics  . Alcohol use: No  . Drug use: No    Home Medications Prior to Admission medications   Medication Sig Start Date End Date Taking? Authorizing Provider  carisoprodol (SOMA) 350 MG tablet Take 350 mg by mouth 4 (four) times daily as needed.      [provider]  cetirizine (ZYRTEC) 10 MG tablet Take 10 mg by mouth daily.    [provider]  eletriptan (RELPAX) 20 MG tablet One tablet by mouth as needed for migraine headache.  If the headache improves and then returns, dose may be repeated after 2 hours have elapsed since first dose (do not exceed 80 mg per day). may repeat in 2 hours if necessary     [provider]  fluticasone (FLONASE) 50 MCG/ACT nasal spray Place into both nostrils daily.    [provider]  HYDROcodone-acetaminophen (NORCO/VICODIN) 5-325 MG tablet Take 1 tablet by mouth every 6 (six) hours as needed for moderate pain. 05/28/20   Vanetta Mulders, MD  ibuprofen (ADVIL,MOTRIN) 200 MG tablet Take 200 mg by mouth every 6 (six) hours as needed.    [provider]  magnesium gluconate (  MAGONATE) 500 MG tablet Take 500 mg by mouth 2 (two) times daily.    [provider]  Melatonin 5 MG TABS Take 1 tablet by mouth daily as needed.     [provider]  metoprolol succinate (TOPROL-XL) 100 MG 24 hr tablet Take with or immediately following a meal. 04/27/20   McGowen, Maryjean Morn, MD  Milnacipran HCl (SAVELLA) 25 MG TABS Take 25 mg by mouth 2 (two) times daily. 07/07/17   [provider]  naratriptan (AMERGE) 2.5 MG tablet Take 2.5 mg by mouth as needed for migraine. Take one (1) tablet at onset of headache; if returns or does not resolve, may repeat after 4 hours; do not exceed five (5) mg in 24 hours.    [provider]  predniSONE (DELTASONE) 10  MG tablet 4 tabs po qd x 3d, then 2 tabs po qd x 3d, then 1 tab po qd x 3d 02/09/20   McGowen, Maryjean Morn, MD  zolpidem (AMBIEN CR) 12.5 MG CR tablet Take 12.5 mg by mouth at bedtime as needed for sleep.     [provider]  zonisamide (ZONEGRAN) 100 MG capsule Take 100 mg by mouth daily.    [provider]    Allergies    Patient has no known allergies.  Review of Systems   Review of Systems  Constitutional: Negative for chills and fever.  HENT: Negative for congestion, rhinorrhea and sore throat.   Eyes: Negative for visual disturbance.  Respiratory: Positive for shortness of breath. Negative for cough.   Cardiovascular: Positive for chest pain. Negative for leg swelling.  Gastrointestinal: Negative for abdominal pain, diarrhea, nausea and vomiting.  Genitourinary: Negative for dysuria.  Musculoskeletal: Negative for back pain and neck pain.  Skin: Negative for rash.  Neurological: Positive for numbness. Negative for dizziness, light-headedness and headaches.  Hematological: Does not bruise/bleed easily.  Psychiatric/Behavioral: Negative for confusion.    Physical Exam Updated Vital Signs BP (!) 140/91 (BP Location: Right Arm)   Pulse (!) 53   Temp 98.3 F (36.8 C) (Oral)   Resp 16   Ht 1.651 m (5\' 5" )   Wt 72.6 kg   SpO2 100%   BMI 26.63 kg/m   Physical Exam Vitals and nursing note reviewed.  Constitutional:      General: She is not in acute distress.    Appearance: Normal appearance. She is well-developed.  HENT:     Head: Normocephalic and atraumatic.  Eyes:     Conjunctiva/sclera: Conjunctivae normal.     Pupils: Pupils are equal, round, and reactive to light.  Cardiovascular:     Rate and Rhythm: Normal rate and regular rhythm.     Heart sounds: No murmur heard.   Pulmonary:     Effort: Pulmonary effort is normal. No respiratory distress.     Breath sounds: Normal breath sounds. No wheezing.  Chest:     Chest wall: No tenderness.    Abdominal:     Palpations: Abdomen is soft.     Tenderness: There is no abdominal tenderness.  Musculoskeletal:        General: No swelling.     Cervical back: Normal range of motion and neck supple. No tenderness.  Skin:    General: Skin is warm and dry.     Capillary Refill: Capillary refill takes less than 2 seconds.  Neurological:     General: No focal deficit present.     Mental Status: She is alert and oriented to person,  place, and time.     Cranial Nerves: No cranial nerve deficit.     Sensory: No sensory deficit.     Motor: No weakness.     ED Results / Procedures / Treatments   Labs (all labs ordered are listed, but only abnormal results are displayed) Labs Reviewed  COMPREHENSIVE METABOLIC PANEL - Abnormal; Notable for the following components:      Result Value   Glucose, Bld 103 (*)    Creatinine, Ser 1.06 (*)    All other components within normal limits  PREGNANCY, URINE  CBC WITH DIFFERENTIAL/PLATELET  D-DIMER, QUANTITATIVE (NOT AT [facility])  TROPONIN I (HIGH SENSITIVITY)  TROPONIN I (HIGH SENSITIVITY)    EKG EKG Interpretation  Date/Time:  Monday May 28 2020 14:00:51 EDT Ventricular Rate:  67 PR Interval:  148 QRS Duration: 80 QT Interval:  408 QTC Calculation: 431 R Axis:   70 Text Interpretation: Normal sinus rhythm Normal ECG Confirmed by Fredia Sorrow [phone]) on 05/28/2020 3:11:51 PM   Radiology DG Chest 2 View  Result Date: 05/28/2020 CLINICAL DATA:  Chest pain. Additional history provided: Patient reports left-sided chest pain for 2 days, shortness of breath for 2 months, history of heart defect. EXAM: CHEST - 2 VIEW COMPARISON:  Prior chest radiographs 12/31/2018 and earlier FINDINGS: Heart size within normal limits. Redemonstrated intracardiac closure device. No appreciable airspace consolidation within the lungs. No frank pulmonary edema. No evidence of pleural effusion or pneumothorax. No acute bony abnormality identified. ACDF hardware  within the lower cervical spine. Surgical clips within the right upper quadrant of the abdomen. IMPRESSION: No evidence of acute cardiopulmonary abnormality. Electronically Signed   By: Kellie Simmering DO   On: 05/28/2020 14:44    Procedures Procedures (including critical care time)  Medications Ordered in ED Medications  0.9 %  sodium chloride infusion ( Intravenous New Bag/Given 05/28/20 1701)  sodium chloride 0.9 % bolus 500 mL (500 mLs Intravenous New Bag/Given 05/28/20 1623)  ondansetron (ZOFRAN) injection 4 mg (4 mg Intravenous Given 05/28/20 1635)  HYDROmorphone (DILAUDID) injection 1 mg (1 mg Intravenous Given 05/28/20 1634)    ED Course  I have reviewed the triage vital signs and the nursing notes.  Pertinent labs & imaging results that were available during my care of the patient were reviewed by me and considered in my medical decision making (see chart for details).    MDM Rules/Calculators/A&P                         Work-up for the 82-month history of shortness of breath without any acute findings.  Chest x-ray negative and D-dimer was negative.  Also for the 2-day history of constant substernal chest pain that radiates to the left arm no evidence of acute abnormality.  Troponins x2 both very normal.  No significant elevation no delta change at all.  EKG had no acute changes.  Will recommend follow back up with her cardiologist and starting a baby aspirin a day.  Course of hydrocodone for the pain.  Final Clinical Impression(s) / ED Diagnoses Final diagnoses:  SOB (shortness of breath)  Precordial pain    Rx / DC Orders ED Discharge Orders         Ordered    HYDROcodone-acetaminophen (NORCO/VICODIN) 5-325 MG tablet  Every 6 hours PRN     Discontinue  Reprint     05/28/20 1721           Fredia Sorrow, MD 05/28/20 1726

## 2020-05-28 NOTE — Discharge Instructions (Addendum)
Make an appointment to follow-up with your cardiologist.  Today's work-up all negative.  Recommend starting a baby aspirin a day.  Short course of hydrocodone provided to help with pain.  Return for any new or worse symptoms or get seen by a cardiologist.

## 2020-05-30 ENCOUNTER — Telehealth: Payer: Self-pay

## 2020-05-30 NOTE — Telephone Encounter (Signed)
Patient evaluated/treated in ER.    [facility] Day - Client TELEPHONE ADVICE RECORD AccessNurse Patient Name: Courtney Haynes Gender: Female DOB: [birth year] Age: 54 Y 1 M 15 D Return Phone Number: [phone] (Primary), [phone] (Secondary) Address[place] [address] Client [facility] Day - Client Client Site  [facility] - Day Physician Courtney Haynes - MD Contact Type Call Who Is Calling Patient / Member / Family / Caregiver Call Type Triage / Clinical Relationship To Patient Self Return Phone Number [phone] (Primary) Chief Complaint CHEST PAIN (>=21 years) - pain, pressure, heaviness or tightness Reason for Call Symptomatic / Request for Health Information Initial Comment Caller states she has been experiencing left arm pain and finger numbness. Caller states she is also experiencing severe stabbing pain from her back to her chest. GOTO Facility Not Listed [facility] ED Translation No Nurse Assessment Nurse: Vear Clock, RN, Alcario Drought Date/Time Lamount Cohen Time): 05/28/2020 11:52:49 AM Confirm and document reason for call. If symptomatic, describe symptoms. ---Caller states she is having left arm pain with numbness in her fingers. Having stabbing pain in her back that goes through to her chest- states pain is in upper left of spine. States symptoms started Saturday morning. Constant pain. Feels short of breath but no worse than her normal. Has the patient had close contact with a person known or suspected to have the novel coronavirus illness OR traveled / lives in area with major community spread (including international travel) in the last 14 days from the onset of symptoms? * If Asymptomatic, screen for exposure and travel within the last 14 days. ---No Does the patient have any new or worsening symptoms? ---Yes Will a triage be completed? ---Yes Related visit to physician within the last 2  weeks? ---No Does the PT have any chronic conditions? (i.e. diabetes, asthma, this includes High risk factors for pregnancy, etc.) ---Yes List chronic conditions. ---HTN- medicated Is the patient pregnant or possibly pregnant? (Ask all females between the ages of 60-55) ---No Is this a behavioral health or substance abuse call? ---NoPLEASE NOTE: All timestamps contained within this report are represented as [place] Standard Time. CONFIDENTIALTY NOTICE: This fax transmission is intended only for the addressee. It contains information that is legally privileged, confidential or otherwise protected from use or disclosure. If you are not the intended recipient, you are strictly prohibited from reviewing, disclosing, copying using or disseminating any of this information or taking any action in reliance on or regarding this information. If you have received this fax in error, please notify us immediately by telephone so that we can arrange for its return to [place]. Phone: [phone], Toll-Free: [phone], Fax: [phone] Page: 2 of 2 Call Id: [id number] Guidelines Guideline Title Affirmed Question Affirmed Notes Nurse Date/Time Lamount Cohen Time) Chest Pain [1] Chest pain lasts > 5 minutes AND [2] age > 71 Courtney Haynes 05/28/2020 11:56:37 AM Disp. Time Lamount Cohen Time) Disposition Final User 05/28/2020 11:48:09 AM Send to Urgent Mammie Russian 05/28/2020 12:02:41 PM 911 Outcome Documentation Vear Clock, RN, Alcario Drought Reason: Caller refuses to call 911, states has son and parents there with her. Will call her sister to drive her to ED. Encouraged 911 05/28/2020 12:02:00 PM Call EMS 911 Now Yes Vear Clock, RN, Benjaman Lobe Disagree/Comply Disagree Caller Understands Yes PreDisposition InappropriateToAsk Care Advice Given Per Guideline * Immediate medical attention is needed. You need to hang up and call 911 (or an ambulance). CALL EMS 911 NOW: * Triager Discretion: I'll call  you back in a few  minutes to be sure you were able to reach them. IF CALLER ASKS ABOUT ASPIRIN: * Call EMS 911 first. CARE ADVICE given per Chest Pain (Adult) guideline. * If no aspirin allergy, chew an aspirin (160 to 325 mg) while waiting for the paramedics to arrive. Referrals GO TO FACILITY OTHER - SPECIF

## 2020-05-31 ENCOUNTER — Encounter: Payer: Self-pay | Admitting: Family Medicine

## 2020-05-31 ENCOUNTER — Other Ambulatory Visit: Payer: Self-pay

## 2020-05-31 ENCOUNTER — Ambulatory Visit (INDEPENDENT_AMBULATORY_CARE_PROVIDER_SITE_OTHER): Payer: Medicare HMO | Admitting: Family Medicine

## 2020-05-31 VITALS — BP 100/67 | HR 67 | Temp 98.0°F | Resp 16 | Ht 64.5 in | Wt 161.0 lb

## 2020-05-31 DIAGNOSIS — M940 Chondrocostal junction syndrome [Tietze]: Secondary | ICD-10-CM

## 2020-05-31 DIAGNOSIS — G5622 Lesion of ulnar nerve, left upper limb: Secondary | ICD-10-CM

## 2020-05-31 DIAGNOSIS — R0789 Other chest pain: Secondary | ICD-10-CM

## 2020-05-31 DIAGNOSIS — M7918 Myalgia, other site: Secondary | ICD-10-CM

## 2020-05-31 MED ORDER — PREDNISONE 20 MG PO TABS: ORAL_TABLET | ORAL | 0 refills | Status: DC

## 2020-05-31 MED ORDER — PREDNISONE 20 MG PO TABS
ORAL_TABLET | ORAL | 0 refills | Status: DC
Start: 2020-05-31 — End: 2020-05-31

## 2020-05-31 MED ORDER — GABAPENTIN 100 MG PO CAPS: 100.0000 mg | ORAL_CAPSULE | Freq: Three times a day (TID) | ORAL | 0 refills | Status: DC

## 2020-05-31 MED ORDER — HYDROCODONE-ACETAMINOPHEN 5-325 MG PO TABS: 1.0000 | ORAL_TABLET | Freq: Four times a day (QID) | ORAL | 0 refills | Status: DC | PRN

## 2020-05-31 NOTE — Progress Notes (Signed)
OFFICE VISIT  05/31/2020  CC:  Chief Complaint  Patient presents with   Follow-up    recent urgent care visit on 6/21   HPI:    Patient is a 54 y.o. Caucasian female who presents accompanied by her sister for f/u urgent care visit 05/28/20. I reviewed all records from that visit today. She went to [facility] 05/28/20 for 2 mo hx of SOB, and 2 d hx of substernal CP rad to left arm. Troponin x 2, d-dimer, CMET, CBC w/diff all normal.  UPT neg.  CXR neg acute.  EKG normal. She was told to start 81mg  ASA qd, f/u with her cardiologist, and was given some hydrocodone for pain.   CAD RF's: HTN, IFG, FH.  HPI:   Since ED visit she is still hurting L shoulder, esp back near L scapula and into upper L thoracic paraspinous soft tissues/trapezius.  Says pain is down L arm but unclear as to whether c/w radiculopathy pain or not. Described more as decreased sensation and irritation type pain in ulnar aspect of L forearm as well as pinky and ring finger.  Feels clumsy with ulnar aspect of hand.  No trauma prior to sx's but she does use her arms repetitively.  No neck pain.   Prior to 3 d/a she had a little bit of upper L back pain intermittently but no shoulder or arm or chest pain.  Her middle chest pain is still constant but better when she takes vicodin.  This pain is constant, waxing and waning intensity randomly, nonexertional.  No diaphoresis or nausea or palp's.  She does endorse tenderness to palpation of precordium. Still with some SOB that feels like "nervous breathing" to her, but she thinks she wouldn't have worried about it had she not gotten her CP 3 d/a.  She last took a vicodin approx 3.5 hrs ago. Takes 400 ibup at night--has always done this.  ROS: no fevers,no wheezing, no cough, no dizziness, no HAs, no rashes, no melena/hematochezia.  No polyuria or polydipsia.  No tremors.  No acute vision or hearing abnormalities. No n/v/d or abd pain.  No palpitations.    Past Medical  History:  Diagnosis Date   Allergic rhinitis    Cervical spondylosis    Fibromyalgia    Heart defect    PFO--closure procedure approx 2009 (Dr. 2010)   Hypertension    IFG (impaired fasting glucose) 08/2019   Gluc 106->A1c 5.6%   Insomnia    Migraine syndrome    Scoliosis     Past Surgical History:  Procedure Laterality Date   APPENDECTOMY  6th grade   CARDIAC SURGERY     PFO closure 2009   CERVICAL SPINE SURGERY  remote past   discectomy x 1, then discectomy with titanium fusion   CHOLECYSTECTOMY  prior to 2010   COLONOSCOPY     "1990s" --reason unknown   FOOT SURGERY  approx 1990   benign mass removed from bottom of foot   PFTs  01/13/2019   Minimal obstructive airway dz; no response to bronchodilator   Family History  Problem Relation Age of Onset   Heart disease Father    Diabetes Father    Kidney disease Father    Stroke Father    Social History   Socioeconomic History   Marital status: Married    Spouse name: Not on file   Number of children: Not on file   Years of education: Not on file   Highest education  level: Not on file  Occupational History   Not on file  Tobacco Use   Smoking status: Never Smoker   Smokeless tobacco: Never Used  Substance and Sexual Activity   Alcohol use: No   Drug use: No   Sexual activity: Not on file  Other Topics Concern   Not on file  Social History Narrative   Widow, has 1 son named Hydrologist.   Educ: "4 yr degree"   Former occup: Insurance claims handler.  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     Denies tob or alc or street drug use.   Says she was once on oxycontin for fibromyalgia pain but this was in the far remote past.   Social Determinants of Health   Financial Resource Strain:    Difficulty of Paying Living Expenses:   Food Insecurity:    Worried About Charity fundraiser in the Last Year:    Arboriculturist in the Last Year:   Transportation  Needs:    Film/video editor (Medical):    Lack of Transportation (Non-Medical):   Physical Activity:    Days of Exercise per Week:    Minutes of Exercise per Session:   Stress:    Feeling of Stress :   Social Connections:    Frequency of Communication with Friends and Family:    Frequency of Social Gatherings with Friends and Family:    Attends Religious Services:    Active Member of Clubs or Organizations:    Attends Archivist Meetings:    Marital Status:     Outpatient Medications Prior to Visit  Medication Sig Dispense Refill   carisoprodol (SOMA) 350 MG tablet Take 350 mg by mouth 4 (four) times daily as needed.       cetirizine (ZYRTEC) 10 MG tablet Take 10 mg by mouth daily.     fluticasone (FLONASE) 50 MCG/ACT nasal spray Place into both nostrils daily.     ibuprofen (ADVIL,MOTRIN) 200 MG tablet Take 200 mg by mouth every 6 (six) hours as needed.     magnesium gluconate (MAGONATE) 500 MG tablet Take 500 mg by mouth 2 (two) times daily.     Melatonin 5 MG TABS Take 1 tablet by mouth daily as needed.      metoprolol succinate (TOPROL-XL) 100 MG 24 hr tablet Take with or immediately following a meal. 90 tablet 0   naratriptan (AMERGE) 2.5 MG tablet Take 2.5 mg by mouth as needed for migraine. Take one (1) tablet at onset of headache; if returns or does not resolve, may repeat after 4 hours; do not exceed five (5) mg in 24 hours.     zolpidem (AMBIEN CR) 12.5 MG CR tablet Take 12.5 mg by mouth at bedtime as needed for sleep.      zonisamide (ZONEGRAN) 100 MG capsule Take 100 mg by mouth daily.     HYDROcodone-acetaminophen (NORCO/VICODIN) 5-325 MG tablet Take 1 tablet by mouth every 6 (six) hours as needed for moderate pain. 10 tablet 0   eletriptan (RELPAX) 20 MG tablet One tablet by mouth as needed for migraine headache.  If the headache improves and then returns, dose may be repeated after 2 hours have elapsed since first dose (do not exceed  80 mg per day). may repeat in 2 hours if necessary  (Patient not taking: Reported on 05/31/2020)     Milnacipran HCl (SAVELLA) 25 MG TABS Take 25 mg by mouth 2 (two) times  daily. (Patient not taking: Reported on 05/31/2020)     predniSONE (DELTASONE) 10 MG tablet 4 tabs po qd x 3d, then 2 tabs po qd x 3d, then 1 tab po qd x 3d (Patient not taking: Reported on 05/31/2020) 21 tablet 0   No facility-administered medications prior to visit.    No Known Allergies  ROS As per HPI  PE: Vitals with BMI 05/31/2020 05/28/2020 05/28/2020  Height 5' 4.5" - -  Weight 161 lbs - -  BMI 27.22 - -  Systolic 100 140 323  Diastolic 67 91 95  Pulse 67 53 53  O2 sat on RA today is 95%   Gen: Alert, well appearing.  Patient is oriented to person, place, time, and situation. AFFECT: pleasant, lucid thought and speech.  Neck nontender.  ROM fully intact.  Signif tenderness to palpation in L suprascap soft tissues, question of a couple of trigger points.  Everything she does with L arm and shoulder elicits worsening of the pain in suprascapular region and bothers her L forearm in ulnar nerve distribution.  Supraspinatus neg, ER/IR are fine, no acromion or AC joint tenderness  +Tinels on L elbow cubital tunnel area.  This reproduces/worsens her L forearm sx's.  UE strength 5/5 everywhere proximally and distally.  Sensation diminished to cotton ball touch in L arm ulnar nerve distribution. CV: RRR, no m/r/g.   LUNGS: CTA bilat, nonlabored resps, good aeration in all lung fields. Chest wall: her pain is reproduced/worsened with palpation in costochondral regions bilat.     LABS:  Lab Results  Component Value Date   TSH 1.28 08/11/2019   Lab Results  Component Value Date   WBC 7.1 05/28/2020   HGB 14.3 05/28/2020   HCT 42.0 05/28/2020   MCV 92.9 05/28/2020   PLT 333 05/28/2020   Lab Results  Component Value Date   CREATININE 1.06 (H) 05/28/2020   BUN 17 05/28/2020   NA 138 05/28/2020   K 4.1  05/28/2020   CL 105 05/28/2020   CO2 24 05/28/2020   Lab Results  Component Value Date   ALT 19 05/28/2020   AST 16 05/28/2020   ALKPHOS 82 05/28/2020   BILITOT 0.5 05/28/2020   Lab Results  Component Value Date   CHOL 171 08/11/2019   Lab Results  Component Value Date   HDL 43.30 08/11/2019   Lab Results  Component Value Date   LDLCALC 108 (H) 08/11/2019   Lab Results  Component Value Date   TRIG 98.0 08/11/2019   Lab Results  Component Value Date   CHOLHDL 4 08/11/2019   Lab Results  Component Value Date   HGBA1C 5.6 08/12/2019   IMPRESSION AND PLAN:  1) Acute myofascial L suprascapular pain, likely 1-2 trigger points present but pt's exam was not consistent with regard to this. Will see if ice, rest, and prednisone help with this (40mg  qd x 5d, then 20mg  qd x 5d).  Also, gabapentin 100 mg tid. Vicodin 04/3324, 1 qid prn, #15.  Use sparingly so we can see if our other treatments are helping.  2) L cubital tunnel syndrome/ulnar neuropathy:  Encouraged ice, avoid stretching ulnar nerve, wear padding over cubital tunnel region.  Gabapentin 100 mg tid.  The prednisone may help this some as well.  3) Atypical CP:  Chest wall pain, possibly costochondritis. Given her description of her symptoms AND recent ED eval I have VERY low suspicion of this being anything intrathoracic in origin.  An After Visit Summary  was printed and given to the patient.  Spent 45 min with pt today, with >50% of this time spent in counseling and care coordination regarding the above problems.  FOLLOW UP: Return for 10 days f/u pain/neuropathy.  Signed:  Santiago Bumpers, MD           05/31/2020

## 2020-05-31 NOTE — Patient Instructions (Signed)
Cubital Tunnel Syndrome  Cubital tunnel syndrome is a condition that causes pain and weakness of the forearm and hand. It happens when one of the nerves that runs along the inside of the elbow joint (ulnar nerve) becomes irritated. This condition is usually caused by repeated arm motions that are done during sports or work-related activities. What are the causes? This condition may be caused by:  Increased pressure on the ulnar nerve at the elbow, arm, or forearm. This can result from: ? Irritation caused by repeated elbow bending. ? Poorly healed elbow fractures. ? Tumors in the elbow. These are usually noncancerous (benign). ? Scar tissue that develops in the elbow after an injury. ? Bony growths (spurs) near the ulnar nerve.  Stretching of the nerve due to loose elbow ligaments.  Trauma to the nerve at the elbow. What increases the risk? The following factors may make you more likely to develop this condition:  Doing manual labor that requires frequent bending of the elbow.  Playing sports that include repeated or strenuous throwing motions, such as baseball.  Playing contact sports, such as football or lacrosse.  Not warming up properly before activities.  Having diabetes.  Having an underactive thyroid (hypothyroidism). What are the signs or symptoms? Symptoms of this condition include:  Clumsiness or weakness of the hand.  Tenderness of the inner elbow.  Aching or soreness of the inner elbow, forearm, or fingers, especially the little finger or the ring finger.  Increased pain when forcing the elbow to bend.  Reduced control when throwing objects.  Tingling, numbness, or a burning feeling inside the forearm or in part of the hand or fingers, especially the little finger or the ring finger.  Sharp pains that shoot from the elbow down to the wrist and hand.  The inability to grip or pinch hard. How is this diagnosed? This condition is diagnosed based on:  Your  symptoms and medical history. Your health care provider will also ask for details about any injury.  A physical exam. You may also have tests, including:  Electromyogram (EMG). This test measures electrical signals sent by your nerves into the muscles.  Nerve conduction study. This test measures how well electrical signals pass through your nerves.  Imaging tests, such as X-rays, ultrasound, and MRI. These tests check for possible causes of your condition. How is this treated? This condition may be treated by:  Stopping the activities that are causing your symptoms to get worse.  Icing and taking medicines to reduce pain and swelling.  Wearing a splint to prevent your elbow from bending, or wearing an elbow pad where the ulnar nerve is closest to the skin.  Working with a physical therapist in less severe cases. This may help to: ? Decrease your symptoms. ? Improve the strength and range of motion of your elbow, forearm, and hand. If these treatments do not help, surgery may be needed. Follow these instructions at home: If you have a splint:  Wear the splint as told by your health care provider. Remove it only as told by your health care provider.  Loosen the splint if your fingers tingle, become numb, or turn cold and blue.  Keep the splint clean.  If the splint is not waterproof: ? Do not let it get wet. ? Cover it with a watertight covering when you take a bath or shower. Managing pain, stiffness, and swelling   If directed, put ice on the injured area: ? Put ice in a plastic bag. ?   Place a towel between your skin and the bag. ? Leave the ice on for 20 minutes, 2-3 times a day.  Move your fingers often to avoid stiffness and to lessen swelling.  Raise (elevate) the injured area above the level of your heart while you are sitting or lying down. General instructions  Take over-the-counter and prescription medicines only as told by your health care provider.  Do any  exercise or physical therapy as told by your health care provider.  Do not drive or use heavy machinery while taking prescription pain medicine.  If you were given an elbow pad, wear it as told by your health care provider.  Keep all follow-up visits as told by your health care provider. This is important. Contact a health care provider if:  Your symptoms get worse.  Your symptoms do not get better with treatment.  You have new pain.  Your hand on the injured side feels numb or cold. Summary  Cubital tunnel syndrome is a condition that causes pain and weakness of the forearm and hand.  You are more likely to develop this condition if you do work or play sports that involve repeated arm movements.  This condition is often treated by stopping repetitive activities, applying ice, and using anti-inflammatory medicines.  In rare cases, surgery may be needed. This information is not intended to replace advice given to you by your health care provider. Make sure you discuss any questions you have with your health care provider. Document Revised: 04/12/2018 Document Reviewed: 04/12/2018 Elsevier Patient Education  2020 [employer] Inc[employer]  

## 2020-05-31 NOTE — Telephone Encounter (Signed)
Pt was just seen for urgent care f/u and given Rx for hydrocodone. She claims this was sent to wrong pharmacy, pharmacy updated. Due to type of med, unable to resend or transfer  Please advise. Medication pending

## 2020-05-31 NOTE — Telephone Encounter (Signed)
OK, rx resent

## 2020-06-13 ENCOUNTER — Other Ambulatory Visit: Payer: Self-pay

## 2020-06-13 ENCOUNTER — Ambulatory Visit (INDEPENDENT_AMBULATORY_CARE_PROVIDER_SITE_OTHER): Payer: Medicare HMO | Admitting: Family Medicine

## 2020-06-13 ENCOUNTER — Encounter: Payer: Self-pay | Admitting: Family Medicine

## 2020-06-13 VITALS — BP 114/74 | HR 74 | Temp 98.5°F | Resp 16 | Ht 64.5 in | Wt 161.0 lb

## 2020-06-13 DIAGNOSIS — M7918 Myalgia, other site: Secondary | ICD-10-CM

## 2020-06-13 DIAGNOSIS — R1013 Epigastric pain: Secondary | ICD-10-CM | POA: Diagnosis not present

## 2020-06-13 DIAGNOSIS — K219 Gastro-esophageal reflux disease without esophagitis: Secondary | ICD-10-CM

## 2020-06-13 DIAGNOSIS — G5622 Lesion of ulnar nerve, left upper limb: Secondary | ICD-10-CM | POA: Diagnosis not present

## 2020-06-13 MED ORDER — MELOXICAM 15 MG PO TABS
15.0000 mg | ORAL_TABLET | Freq: Every day | ORAL | 1 refills | Status: DC
Start: 2020-06-13 — End: 2020-08-06

## 2020-06-13 MED ORDER — PANTOPRAZOLE SODIUM 40 MG PO TBEC
40.0000 mg | DELAYED_RELEASE_TABLET | Freq: Every day | ORAL | 6 refills | Status: DC
Start: 2020-06-13 — End: 2021-01-11

## 2020-06-13 MED ORDER — GABAPENTIN 100 MG PO CAPS: ORAL_CAPSULE | ORAL | 1 refills | Status: DC

## 2020-06-13 NOTE — Progress Notes (Signed)
OFFICE VISIT  06/13/2020   CC:  Chief Complaint  Patient presents with  . Follow-up    pain, neuropathy     HPI:    Patient is a 54 y.o. Caucasian female who presents accompanied by her mother for 2 week f/u myofascial pain, suspected ulnar neuropathy, suspected costochondritis.  A/P as of last visit: "1) Acute myofascial L suprascapular pain, likely 1-2 trigger points present but pt's exam was not consistent with regard to this. Will see if ice, rest, and prednisone help with this (40mg  qd x 5d, then 20mg  qd x 5d).  Also, gabapentin 100 mg tid. Vicodin 04/3324, 1 qid prn, #15.  Use sparingly so we can see if our other treatments are helping.  2) L cubital tunnel syndrome/ulnar neuropathy:  Encouraged ice, avoid stretching ulnar nerve, wear padding over cubital tunnel region.  Gabapentin 100 mg tid.  The prednisone may help this some as well.  3) Atypical CP:  Chest wall pain, possibly costochondritis. Given her description of her symptoms AND recent ED eval I have VERY low suspicion of this being anything intrathoracic in origin."  INTERIM HX: She is a little improved regarding pain in L suprascap area. Still with signif numbness and discomfort in ulnar area L elbow down into hand. L hand feeling clumsy. Still with some sternal/costochondral CP that is slightly better. No side effects from gabapentin at 100 mg tid dosing. Hx of leg aches on high dose gabapentin in the past. Took all the prednisone. Now back on ibup but only 400 mg qhs. No longer has any vicodin.  Chronic feeling of mild burning in mid-epigastric area.  Nothing makes it better or worse that she can tell.  Also GER sx's with this.  NO otc meds for this problem right now. Says protonix helped in the past.  ROS: no fevers, no exertional CP, no SOB, no wheezing, no cough, no dizziness, no HAs, no rashes, no melena/hematochezia.  No polyuria or polydipsia. No joint pains or swelling. No tremors.  No acute vision  or hearing abnormalities. No n/v/d or abd pain.  No palpitations.     Past Medical History:  Diagnosis Date  . Allergic rhinitis   . Cervical spondylosis   . Fibromyalgia   . Heart defect    PFO--closure procedure approx 2009 (Dr. 05/3324)  . Hypertension   . IFG (impaired fasting glucose) 08/2019   Gluc 106->A1c 5.6%  . Insomnia   . Migraine syndrome   . Scoliosis     Past Surgical History:  Procedure Laterality Date  . APPENDECTOMY  6th grade  . CARDIAC SURGERY     PFO closure 2009  . CERVICAL SPINE SURGERY  remote past   discectomy x 1, then discectomy with titanium fusion  . CHOLECYSTECTOMY  prior to 2010  . COLONOSCOPY     "1990s" --reason unknown  . FOOT SURGERY  approx 1990   benign mass removed from bottom of foot  . PFTs  01/13/2019   Minimal obstructive airway dz; no response to bronchodilator    Outpatient Medications Prior to Visit  Medication Sig Dispense Refill  . carisoprodol (SOMA) 350 MG tablet Take 350 mg by mouth 4 (four) times daily as needed.      . cetirizine (ZYRTEC) 10 MG tablet Take 10 mg by mouth daily.    . fluticasone (FLONASE) 50 MCG/ACT nasal spray Place into both nostrils daily.    . magnesium gluconate (MAGONATE) 500 MG tablet Take 500 mg by mouth 2 (two)  times daily.    . Melatonin 5 MG TABS Take 1 tablet by mouth daily as needed.     . metoprolol succinate (TOPROL-XL) 100 MG 24 hr tablet Take with or immediately following a meal. 90 tablet 0  . naratriptan (AMERGE) 2.5 MG tablet Take 2.5 mg by mouth as needed for migraine. Take one (1) tablet at onset of headache; if returns or does not resolve, may repeat after 4 hours; do not exceed five (5) mg in 24 hours.    Marland Kitchen zolpidem (AMBIEN CR) 12.5 MG CR tablet Take 12.5 mg by mouth at bedtime as needed for sleep.     Marland Kitchen zonisamide (ZONEGRAN) 100 MG capsule Take 100 mg by mouth daily.    Marland Kitchen gabapentin (NEURONTIN) 100 MG capsule Take 1 capsule (100 mg total) by mouth 3 (three) times daily. 45 capsule  0  . ibuprofen (ADVIL,MOTRIN) 200 MG tablet Take 200 mg by mouth every 6 (six) hours as needed.    . eletriptan (RELPAX) 20 MG tablet One tablet by mouth as needed for migraine headache.  If the headache improves and then returns, dose may be repeated after 2 hours have elapsed since first dose (do not exceed 80 mg per day). may repeat in 2 hours if necessary  (Patient not taking: Reported on 05/31/2020)    . Milnacipran HCl (SAVELLA) 25 MG TABS Take 25 mg by mouth 2 (two) times daily. (Patient not taking: Reported on 05/31/2020)    . HYDROcodone-acetaminophen (NORCO/VICODIN) 5-325 MG tablet Take 1 tablet by mouth every 6 (six) hours as needed for moderate pain. (Patient not taking: Reported on 06/13/2020) 15 tablet 0  . predniSONE (DELTASONE) 20 MG tablet 2 tabs po qd x 5d, then 1 tab po qd x 5d (Patient not taking: Reported on 06/13/2020) 15 tablet 0   No facility-administered medications prior to visit.    No Known Allergies  ROS As per HPI  PE: Vitals with BMI 06/13/2020 05/31/2020 05/28/2020  Height 5' 4.5" 5' 4.5" -  Weight 161 lbs 161 lbs -  BMI 27.22 27.22 -  Systolic 114 100 161  Diastolic 74 67 91  Pulse 74 67 53  O2 sat on RA today is 97%  Gen: Alert, well appearing.  Patient is oriented to person, place, time, and situation. AFFECT: pleasant, lucid thought and speech. Mild TTP in suprascapular region on left, no trigger point.  Mild TTP in L costochondral junction about mid sternal level. L tinel's testing at cubital tunnel is neg. Bilat hand/fingers strength 5/5.  Wrists 5/5 strength all motions.  No muscle waisting. CV: RRR, no m/r/g.   LUNGS: CTA bilat, nonlabored resps, good aeration in all lung fields. EXT: no clubbing or cyanosis.  no edema.    LABS:    Chemistry      Component Value Date/Time   NA 138 05/28/2020 1413   K 4.1 05/28/2020 1413   CL 105 05/28/2020 1413   CO2 24 05/28/2020 1413   BUN 17 05/28/2020 1413   CREATININE 1.06 (H) 05/28/2020 1413       Component Value Date/Time   CALCIUM 9.2 05/28/2020 1413   ALKPHOS 82 05/28/2020 1413   AST 16 05/28/2020 1413   ALT 19 05/28/2020 1413   BILITOT 0.5 05/28/2020 1413     Lab Results  Component Value Date   WBC 7.1 05/28/2020   HGB 14.3 05/28/2020   HCT 42.0 05/28/2020   MCV 92.9 05/28/2020   PLT 333 05/28/2020   Lab Results  Component Value Date   TSH 1.28 08/11/2019   Lab Results  Component Value Date   CHOL 171 08/11/2019   HDL 43.30 08/11/2019   LDLCALC 108 (H) 08/11/2019   TRIG 98.0 08/11/2019   CHOLHDL 4 08/11/2019     IMPRESSION AND PLAN:  1) Myofascial pain syndrome: minimal improvement. Increase gabapentin to 200 tid.  High dosing (900 tid) in the remote past caused leg pains so we'll avoid doses near this range). Stop ibup. Start trial of mobic 15mg  qd with food.  2) L ulnar neuropathy: refer to neurology for further eval/?confirmatory testing. She has trouble keeping pressure off/tension off cubital tunnel region. Gabapentin increase as in #1 above.  3) Dyspepsia/GERD: restart pantoprazole, which has worked well for her in the past. She says she eats a "GERD-friendly" diet.  An After Visit Summary was printed and given to the patient.  FOLLOW UP: Return in about 4 weeks (around 07/11/2020) for f/u myalgia/neuropathy.  Signed:  09/10/2020, MD           06/13/2020

## 2020-06-13 NOTE — Patient Instructions (Signed)
STOP ibuprofen or any other kind of over the counter pain medication.

## 2020-06-15 ENCOUNTER — Telehealth: Payer: Self-pay

## 2020-06-15 ENCOUNTER — Encounter: Payer: Self-pay | Admitting: Neurology

## 2020-06-15 NOTE — Telephone Encounter (Signed)
Patient states that she has an appt with Glendale Chard, DO In October 2021. Would like to talk to Dr.McGowen about this. She does not think she can wait that long.  She has seen someone at [facility] - neurosurgeon, should she call him about this issue? ulnar neuropathy   Patient can be reached at [phone]

## 2020-06-18 NOTE — Telephone Encounter (Signed)
Patient saw Dr.Michael Thad Ranger with [facility] in the past for migraines. Any other suggestions? Pt expressed she cannot wait that long for appt

## 2020-06-18 NOTE — Telephone Encounter (Signed)
Sure she can talk to the physician at [facility] about the ulnar neuropathy. Also, if she can't set anything up with him then we can try [facility] and see if they can get her in sooner than October.

## 2020-06-18 NOTE — Telephone Encounter (Signed)
Patient advised of recommendations. She is going to call Dr.Reynolds today and see what they can offer and let us know from there.

## 2020-06-27 DIAGNOSIS — R202 Paresthesia of skin: Secondary | ICD-10-CM | POA: Diagnosis not present

## 2020-06-27 DIAGNOSIS — R200 Anesthesia of skin: Secondary | ICD-10-CM | POA: Diagnosis not present

## 2020-06-27 DIAGNOSIS — Z981 Arthrodesis status: Secondary | ICD-10-CM | POA: Diagnosis not present

## 2020-06-27 DIAGNOSIS — R29898 Other symptoms and signs involving the musculoskeletal system: Secondary | ICD-10-CM | POA: Diagnosis not present

## 2020-07-05 ENCOUNTER — Other Ambulatory Visit: Payer: Self-pay | Admitting: Neurology

## 2020-07-05 DIAGNOSIS — R29898 Other symptoms and signs involving the musculoskeletal system: Secondary | ICD-10-CM

## 2020-07-05 DIAGNOSIS — R202 Paresthesia of skin: Secondary | ICD-10-CM

## 2020-07-05 DIAGNOSIS — R200 Anesthesia of skin: Secondary | ICD-10-CM

## 2020-07-09 ENCOUNTER — Ambulatory Visit (INDEPENDENT_AMBULATORY_CARE_PROVIDER_SITE_OTHER): Payer: Medicare HMO

## 2020-07-09 ENCOUNTER — Other Ambulatory Visit: Payer: Self-pay

## 2020-07-09 DIAGNOSIS — R29898 Other symptoms and signs involving the musculoskeletal system: Secondary | ICD-10-CM | POA: Diagnosis not present

## 2020-07-09 DIAGNOSIS — M50222 Other cervical disc displacement at C5-C6 level: Secondary | ICD-10-CM | POA: Diagnosis not present

## 2020-07-09 DIAGNOSIS — R200 Anesthesia of skin: Secondary | ICD-10-CM | POA: Diagnosis not present

## 2020-07-09 DIAGNOSIS — R202 Paresthesia of skin: Secondary | ICD-10-CM | POA: Diagnosis not present

## 2020-07-09 MED ORDER — GADOBUTROL 1 MMOL/ML IV SOLN
7.0000 mL | Freq: Once | INTRAVENOUS | Status: AC | PRN
  Administered 2020-07-09: 7 mL via INTRAVENOUS

## 2020-07-11 ENCOUNTER — Ambulatory Visit (INDEPENDENT_AMBULATORY_CARE_PROVIDER_SITE_OTHER): Payer: Medicare HMO | Admitting: Family Medicine

## 2020-07-11 ENCOUNTER — Encounter: Payer: Self-pay | Admitting: Family Medicine

## 2020-07-11 ENCOUNTER — Other Ambulatory Visit: Payer: Self-pay

## 2020-07-11 VITALS — BP 118/84 | HR 53 | Temp 97.6°F | Resp 16 | Ht 64.5 in | Wt 163.6 lb

## 2020-07-11 DIAGNOSIS — R202 Paresthesia of skin: Secondary | ICD-10-CM | POA: Diagnosis not present

## 2020-07-11 DIAGNOSIS — M4802 Spinal stenosis, cervical region: Secondary | ICD-10-CM

## 2020-07-11 DIAGNOSIS — L700 Acne vulgaris: Secondary | ICD-10-CM | POA: Diagnosis not present

## 2020-07-11 DIAGNOSIS — Z9889 Other specified postprocedural states: Secondary | ICD-10-CM | POA: Diagnosis not present

## 2020-07-11 DIAGNOSIS — R937 Abnormal findings on diagnostic imaging of other parts of musculoskeletal system: Secondary | ICD-10-CM | POA: Diagnosis not present

## 2020-07-11 DIAGNOSIS — R200 Anesthesia of skin: Secondary | ICD-10-CM

## 2020-07-11 DIAGNOSIS — M5412 Radiculopathy, cervical region: Secondary | ICD-10-CM

## 2020-07-11 NOTE — Progress Notes (Signed)
OFFICE VISIT  07/11/2020   CC:  Chief Complaint  Patient presents with  . Follow-up    myalgia/neuropathy   HPI:    Patient is a 54 y.o. Caucasian female who presents for 1 month f/u myofascial pain, L ulnar neuropathy, and dyspepsia/GERD. A/P as of last visit: "1) Myofascial pain syndrome: minimal improvement. Increase gabapentin to 200 tid.  High dosing (900 tid) in the remote past caused leg pains so we'll avoid doses near this range). Stop ibup. Start trial of mobic 15mg  qd with food.  2) L ulnar neuropathy: refer to neurology for further eval/?confirmatory testing. She has trouble keeping pressure off/tension off cubital tunnel region. Gabapentin increase not a good idea at this time. 3) Dyspepsia/GERD: restart pantoprazole, which has worked well for her in the past. She says she eats a "GERD-friendly" diet."  INTERIM HX: Saw Dr. at [facility], NCS/EMG did not confirm ulnar neuropathy. She got MRI locally ordered by him.  No f/u planned b/c pt says Dr. [facility].   Her cerv stabilization surgery was done by by Dr. Thad Ranger, local neurosurgeon.  Her discomfort is mostly inner part of L arm from mid humerus level down to wrist, mixture of aching and sometimes prickly sharp.  No sensation in fingers 4 and 5 on L hand. Past Medical History:  Diagnosis Date  . Allergic rhinitis   . Cervical spondylosis   . Fibromyalgia   . Heart defect    PFO--closure procedure approx 2009 (Dr. 2010)  . Hypertension   . IFG (impaired fasting glucose) 08/2019   Gluc 106->A1c 5.6%  . Insomnia   . Migraine syndrome   . Scoliosis     Past Surgical History:  Procedure Laterality Date  . APPENDECTOMY  6th grade  . CARDIAC SURGERY     PFO closure 2009  . CERVICAL SPINE SURGERY  remote past   discectomy x 1, then discectomy with titanium fusion  . CHOLECYSTECTOMY  prior to 2010  . COLONOSCOPY     "1990s" --reason unknown  . FOOT SURGERY  approx 1990   benign mass removed from bottom  of foot  . PFTs  01/13/2019   Minimal obstructive airway dz; no response to bronchodilator    Outpatient Medications Prior to Visit  Medication Sig Dispense Refill  . carisoprodol (SOMA) 350 MG tablet Take 350 mg by mouth 4 (four) times daily as needed.      . cetirizine (ZYRTEC) 10 MG tablet Take 10 mg by mouth daily.    . fluticasone (FLONASE) 50 MCG/ACT nasal spray Place into both nostrils daily.    03/14/2019 gabapentin (NEURONTIN) 100 MG capsule 2 tabs po tid 180 capsule 1  . magnesium gluconate (MAGONATE) 500 MG tablet Take 500 mg by mouth 2 (two) times daily.    . Melatonin 5 MG TABS Take 1 tablet by mouth daily as needed.     . meloxicam (MOBIC) 15 MG tablet Take 1 tablet (15 mg total) by mouth daily. 30 tablet 1  . metoprolol succinate (TOPROL-XL) 100 MG 24 hr tablet Take with or immediately following a meal. 90 tablet 0  . naratriptan (AMERGE) 2.5 MG tablet Take 2.5 mg by mouth as needed for migraine. Take one (1) tablet at onset of headache; if returns or does not resolve, may repeat after 4 hours; do not exceed five (5) mg in 24 hours.    . pantoprazole (PROTONIX) 40 MG tablet Take 1 tablet (40 mg total) by mouth daily. 30 tablet 6  . zolpidem (  AMBIEN CR) 12.5 MG CR tablet Take 12.5 mg by mouth at bedtime as needed for sleep.     Marland Kitchen zonisamide (ZONEGRAN) 100 MG capsule Take 100 mg by mouth daily.    Marland Kitchen eletriptan (RELPAX) 20 MG tablet One tablet by mouth as needed for migraine headache.  If the headache improves and then returns, dose may be repeated after 2 hours have elapsed since first dose (do not exceed 80 mg per day). may repeat in 2 hours if necessary  (Patient not taking: Reported on 05/31/2020)    . Milnacipran HCl (SAVELLA) 25 MG TABS Take 25 mg by mouth 2 (two) times daily. (Patient not taking: Reported on 05/31/2020)     No facility-administered medications prior to visit.    No Known Allergies  ROS As per HPI  PE: Blood pressure 118/84, pulse (!) 53, temperature 97.6 F  (36.4 C), temperature source Oral, resp. rate 16, height 5' 4.5" (1.638 m), weight 163 lb 9.6 oz (74.2 kg), SpO2 99 %. Gen: Alert, well appearing.  Patient is oriented to person, place, time, and situation. AFFECT: pleasant, lucid thought and speech. Neuro: CN 2-12 intact bilaterally, strength 5-/5 (diffuse mild weakness, question of inconsistency on testing) in proximal and distal upper extremities and lower extremities bilaterally.  No tremor.  No disdiadochokinesis.  No ataxia.  Upper extremity and lower extremity DTRs symmetric.  No pronator drift.   LABS:  Lab Results  Component Value Date   TSH 1.28 08/11/2019   Lab Results  Component Value Date   WBC 7.1 05/28/2020   HGB 14.3 05/28/2020   HCT 42.0 05/28/2020   MCV 92.9 05/28/2020   PLT 333 05/28/2020   Lab Results  Component Value Date   CREATININE 1.06 (H) 05/28/2020   BUN 17 05/28/2020   NA 138 05/28/2020   K 4.1 05/28/2020   CL 105 05/28/2020   CO2 24 05/28/2020   Lab Results  Component Value Date   ALT 19 05/28/2020   AST 16 05/28/2020   ALKPHOS 82 05/28/2020   BILITOT 0.5 05/28/2020   Lab Results  Component Value Date   CHOL 171 08/11/2019   Lab Results  Component Value Date   HDL 43.30 08/11/2019   Lab Results  Component Value Date   LDLCALC 108 (H) 08/11/2019   Lab Results  Component Value Date   TRIG 98.0 08/11/2019   Lab Results  Component Value Date   CHOLHDL 4 08/11/2019   Lab Results  Component Value Date   HGBA1C 5.6 08/12/2019   IMPRESSION AND PLAN:  1) Cervical DDD/spondylosis, with hx of cervical stabilization C6-7 in remote past by Dr. Yetta Barre. Recent symptoms c/w C8/T1 radiculopathy, NCS/EMG at [facility] neurology 06/27/20 with evidence of acute C8 radiculopathy.  MRI C spine 07/09/20 showed new inferiorly migrated left C7-T1 subarticular extrusion with mild spinal canal and moderate to severe left neural foramen narrowing.     2) Facial skin lesions c/w likely acne. Pt requested  referral to dermatologist at the end of our visit today so I ordered this.  Plan: New referral for Dr. Yetta Barre.  An After Visit Summary was printed and given to the patient.  FOLLOW UP: Return for f/u as needed.  Signed:  Santiago Bumpers, MD           07/11/2020

## 2020-07-23 ENCOUNTER — Other Ambulatory Visit: Payer: Self-pay | Admitting: Family Medicine

## 2020-07-31 DIAGNOSIS — M5413 Radiculopathy, cervicothoracic region: Secondary | ICD-10-CM | POA: Diagnosis not present

## 2020-08-06 ENCOUNTER — Other Ambulatory Visit: Payer: Self-pay

## 2020-08-06 MED ORDER — MELOXICAM 15 MG PO TABS: 15.0000 mg | ORAL_TABLET | Freq: Every day | ORAL | 1 refills | Status: DC

## 2020-08-06 NOTE — Telephone Encounter (Signed)
RF request for meloxicam LOV:07/11/20 Next ov: advised f/u as needed Last written: 06/13/20(30,1)  Med pending, please advise.

## 2020-08-06 NOTE — Telephone Encounter (Signed)
Patient refill request  meloxicam (MOBIC) 15 MG tablet [[id number]]    [facility] ([address])  [phone]

## 2020-08-07 NOTE — Telephone Encounter (Signed)
Left detailed message advising refill sent, okay per dpr. 

## 2020-08-08 ENCOUNTER — Encounter: Payer: Self-pay | Admitting: Family Medicine

## 2020-08-08 DIAGNOSIS — M47812 Spondylosis without myelopathy or radiculopathy, cervical region: Secondary | ICD-10-CM

## 2020-08-08 HISTORY — DX: Spondylosis without myelopathy or radiculopathy, cervical region: M47.812

## 2020-08-09 DIAGNOSIS — Z1152 Encounter for screening for COVID-19: Secondary | ICD-10-CM | POA: Diagnosis not present

## 2020-08-15 ENCOUNTER — Other Ambulatory Visit: Payer: Self-pay

## 2020-08-15 DIAGNOSIS — M5013 Cervical disc disorder with radiculopathy, cervicothoracic region: Secondary | ICD-10-CM | POA: Diagnosis not present

## 2020-08-15 DIAGNOSIS — M50223 Other cervical disc displacement at C6-C7 level: Secondary | ICD-10-CM | POA: Diagnosis not present

## 2020-08-15 MED ORDER — GABAPENTIN 100 MG PO CAPS: ORAL_CAPSULE | ORAL | 1 refills | Status: DC

## 2020-08-24 ENCOUNTER — Telehealth: Payer: Self-pay

## 2020-08-24 ENCOUNTER — Other Ambulatory Visit: Payer: Self-pay

## 2020-08-24 NOTE — Telephone Encounter (Signed)
Appt scheduled for 9/21 @ 11:30

## 2020-08-24 NOTE — Telephone Encounter (Signed)
Spoke with patient and advised for proper treatment, provider requested in office appt.

## 2020-08-24 NOTE — Telephone Encounter (Signed)
Needs office visit (in person).

## 2020-08-24 NOTE — Telephone Encounter (Signed)
Spoke with patient and stated she discussed this briefly during appt with Dr.Jones, neurosurgeon 2 weeks prior to back surgery on 9/8. She was told it might be pitted edema . Neurontin medication stopped before surgery. It started out with her left leg being worse and now the right leg is worse. Denies using any compression stockings currently but is doing the least amount of activity possible.   Please advise, thanks.

## 2020-08-24 NOTE — Telephone Encounter (Signed)
Patient called about swelling in her legs and medication. It was a little confusing about what she was trying to explain, pivotal leg ... whether meds caused this reaction.  Please call patient [phone].

## 2020-08-28 ENCOUNTER — Ambulatory Visit (INDEPENDENT_AMBULATORY_CARE_PROVIDER_SITE_OTHER): Payer: Medicare HMO | Admitting: Family Medicine

## 2020-08-28 ENCOUNTER — Encounter: Payer: Self-pay | Admitting: Family Medicine

## 2020-08-28 ENCOUNTER — Other Ambulatory Visit: Payer: Self-pay

## 2020-08-28 VITALS — BP 132/87 | HR 61 | Temp 98.0°F | Resp 16 | Ht 64.5 in | Wt 164.0 lb

## 2020-08-28 DIAGNOSIS — R0600 Dyspnea, unspecified: Secondary | ICD-10-CM | POA: Diagnosis not present

## 2020-08-28 DIAGNOSIS — R0609 Other forms of dyspnea: Secondary | ICD-10-CM

## 2020-08-28 DIAGNOSIS — R609 Edema, unspecified: Secondary | ICD-10-CM

## 2020-08-28 LAB — CBC WITH DIFFERENTIAL/PLATELET
Basophils Absolute: 0.1 10*3/uL (ref 0.0–0.1)
Basophils Relative: 0.7 % (ref 0.0–3.0)
Eosinophils Absolute: 0.4 10*3/uL (ref 0.0–0.7)
Eosinophils Relative: 4.9 % (ref 0.0–5.0)
HCT: 39.5 % (ref 36.0–46.0)
Hemoglobin: 13.3 g/dL (ref 12.0–15.0)
Lymphocytes Relative: 26.2 % (ref 12.0–46.0)
Lymphs Abs: 1.9 10*3/uL (ref 0.7–4.0)
MCHC: 33.6 g/dL (ref 30.0–36.0)
MCV: 93.8 fl (ref 78.0–100.0)
Monocytes Absolute: 0.6 10*3/uL (ref 0.1–1.0)
Monocytes Relative: 7.8 % (ref 3.0–12.0)
Neutro Abs: 4.4 10*3/uL (ref 1.4–7.7)
Neutrophils Relative %: 60.4 % (ref 43.0–77.0)
Platelets: 354 10*3/uL (ref 150.0–400.0)
RBC: 4.21 Mil/uL (ref 3.87–5.11)
RDW: 12.3 % (ref 11.5–15.5)
WBC: 7.2 10*3/uL (ref 4.0–10.5)

## 2020-08-28 LAB — COMPREHENSIVE METABOLIC PANEL
ALT: 26 U/L (ref 0–35)
AST: 15 U/L (ref 0–37)
Albumin: 4.1 g/dL (ref 3.5–5.2)
Alkaline Phosphatase: 103 U/L (ref 39–117)
BUN: 33 mg/dL — ABNORMAL HIGH (ref 6–23)
CO2: 25 mEq/L (ref 19–32)
Calcium: 9.3 mg/dL (ref 8.4–10.5)
Chloride: 102 mEq/L (ref 96–112)
Creatinine, Ser: 1.05 mg/dL (ref 0.40–1.20)
GFR: 54.95 mL/min — ABNORMAL LOW (ref >60.00)
Glucose, Bld: 94 mg/dL (ref 70–99)
Potassium: 4.4 mEq/L (ref 3.5–5.1)
Sodium: 135 mEq/L (ref 135–145)
Total Bilirubin: 0.3 mg/dL (ref 0.2–1.2)
Total Protein: 6.9 g/dL (ref 6.0–8.3)

## 2020-08-28 LAB — TSH: TSH: 2.52 u[IU]/mL (ref 0.35–4.50)

## 2020-08-28 LAB — PROTIME-INR
INR: 1 ratio (ref 0.8–1.0)
Prothrombin Time: 10.7 s (ref 9.6–13.1)

## 2020-08-28 NOTE — Progress Notes (Signed)
OFFICE VISIT  08/28/2020  CC:  Chief Complaint  Patient presents with  . Leg Swelling    bilateral leg swelling, burning, stingling, tingling, several months worsening over last month, severe pain   . Fingers Swelling    HPI:    Patient is a 54 y.o. Caucasian female who presents accompanied by a female friend for swelling in legs. A/P as of last visit: "1) Cervical DDD/spondylosis, with hx of cervical stabilization C6-7 in remote past by Dr. Yetta Barre. Recent symptoms c/w C8/T1 radiculopathy, NCS/EMG at [facility] neurology 06/27/20 with evidence of acute C8 radiculopathy.  MRI C spine 07/09/20 showed new inferiorly migrated left C7-T1 subarticular extrusion with mild spinal canal and moderate to severe left neural foramen narrowing.     2) Facial skin lesions c/w likely acne. Pt requested referral to dermatologist at the end of our visit today so I ordered this."  New referral to Dr. Yetta Barre made.  Interim hx: She got her neck surgery and her pain is better.  Hx of LE swelling chronic. Question of gradually getting worse since getting on gabapentin, then few days ago dramatically worse, "like they were going to pop" alternating which leg was worse--her friend showed me pics today of very swollen ankles. Then fingers and hands felt like they were swelling.  Today she feels like legs are better since elevating them a long time.  Says sodium level intake has not changed any. Was also on mobic and has continued taking this, was on ibup prior to being rx'd the mobic.  Was on neurontin due to neuropathic pain in arm assoc with her cervical nerve impingement..  She stopped the neurontin about 10 d/a. She got her neck surgery around that time and she had the bad swelling at that time alreday (prior to surgery). Question of gradually increasing of her chronic DOE and fatigue over last few months.   Has PND about 2 times per week--wakes up with gasp but unsure if fits exactly with PND.  She does not have  orthopnea. No CP, no hx of CHF, no abd swelling, no jaundice.  +Hx of snoring and ex-husband had told her she had periods of apnea in sleep.  ROS: no fevers, no wheezing, no cough, no dizziness, no HAs, no rashes, no melena/hematochezia.  No polyuria or polydipsia. Chronic myalgias and arthralgias as part of her fibromyalgia syndrome.  No acute vision or hearing abnormalities. No n/v/d or abd pain.  No palpitations.    Past Medical History:  Diagnosis Date  . Allergic rhinitis   . Cervical spondylosis    C8 radiculopathy->plan is for left C7-T1 foraminotomy and microdiscectomy.  . Fibromyalgia   . Heart defect    PFO--closure procedure approx 2009 (Dr. Jacinto Halim)  . Hypertension   . IFG (impaired fasting glucose) 08/2019   Gluc 106->A1c 5.6%  . Insomnia   . Migraine syndrome   . Scoliosis     Past Surgical History:  Procedure Laterality Date  . APPENDECTOMY  6th grade  . CARDIAC SURGERY     PFO closure 2009  . CERVICAL SPINE SURGERY  remote past   discectomy x 1, then discectomy with titanium fusion  . CHOLECYSTECTOMY  prior to 2010  . COLONOSCOPY     "1990s" --reason unknown  . FOOT SURGERY  approx 1990   benign mass removed from bottom of foot  . PFTs  01/13/2019   Minimal obstructive airway dz; no response to bronchodilator    Outpatient Medications Prior to Visit  Medication  Sig Dispense Refill  . carisoprodol (SOMA) 350 MG tablet Take 350 mg by mouth 4 (four) times daily as needed.      . cetirizine (ZYRTEC) 10 MG tablet Take 10 mg by mouth daily.    . fluticasone (FLONASE) 50 MCG/ACT nasal spray Place into both nostrils daily.    . magnesium gluconate (MAGONATE) 500 MG tablet Take 500 mg by mouth 2 (two) times daily.    . Melatonin 5 MG TABS Take 1 tablet by mouth daily as needed.     . metoprolol succinate (TOPROL-XL) 100 MG 24 hr tablet TAKE 1 TABLET BY MOUTH DAILY WITH A MEAL OR IMMEDIATELY AFTER A MEAL. 60 tablet 0  . pantoprazole (PROTONIX) 40 MG tablet Take 1  tablet (40 mg total) by mouth daily. 30 tablet 6  . zolpidem (AMBIEN CR) 12.5 MG CR tablet Take 12.5 mg by mouth at bedtime as needed for sleep.     . meloxicam (MOBIC) 15 MG tablet Take 1 tablet (15 mg total) by mouth daily. 30 tablet 1  . naratriptan (AMERGE) 2.5 MG tablet Take 2.5 mg by mouth as needed for migraine. Take one (1) tablet at onset of headache; if returns or does not resolve, may repeat after 4 hours; do not exceed five (5) mg in 24 hours. (Patient not taking: Reported on 08/28/2020)    . eletriptan (RELPAX) 20 MG tablet One tablet by mouth as needed for migraine headache.  If the headache improves and then returns, dose may be repeated after 2 hours have elapsed since first dose (do not exceed 80 mg per day). may repeat in 2 hours if necessary  (Patient not taking: Reported on 05/31/2020)    . gabapentin (NEURONTIN) 100 MG capsule 2 tabs po tid (Patient not taking: Reported on 08/28/2020) 180 capsule 1  . Milnacipran HCl (SAVELLA) 25 MG TABS Take 25 mg by mouth 2 (two) times daily. (Patient not taking: Reported on 05/31/2020)    . zonisamide (ZONEGRAN) 100 MG capsule Take 100 mg by mouth daily.     No facility-administered medications prior to visit.    No Known Allergies  ROS As per HPI  PE: Vitals with BMI 08/28/2020 07/11/2020 06/13/2020  Height 5' 4.5" 5' 4.5" 5' 4.5"  Weight 164 lbs 163 lbs 10 oz 161 lbs  BMI 27.73 27.66 27.22  Systolic 132 118 109  Diastolic 87 84 74  Pulse 61 53 74     Gen: Alert, well appearing.  Patient is oriented to person, place, time, and situation. AFFECT: pleasant, lucid thought and speech. NAT:FTDD: no injection, icteris, swelling, or exudate.  EOMI, PERRLA. Mouth: lips without lesion/swelling.  Oral mucosa pink and moist. Oropharynx without erythema, exudate, or swelling.  CV: RRR, no m/r/g.   LUNGS: CTA bilat, nonlabored resps, good aeration in all lung fields. ABD: soft, NT/ND EXT: no clubbing or cyanosis.  Question of possible trace  bilat LL pitting edema.  No tenderness or asymmetry.    LABS:    Chemistry      Component Value Date/Time   NA 138 05/28/2020 1413   K 4.1 05/28/2020 1413   CL 105 05/28/2020 1413   CO2 24 05/28/2020 1413   BUN 17 05/28/2020 1413   CREATININE 1.06 (H) 05/28/2020 1413      Component Value Date/Time   CALCIUM 9.2 05/28/2020 1413   ALKPHOS 82 05/28/2020 1413   AST 16 05/28/2020 1413   ALT 19 05/28/2020 1413   BILITOT 0.5 05/28/2020 1413  Lab Results  Component Value Date   TSH 1.28 08/11/2019   Lab Results  Component Value Date   WBC 7.1 05/28/2020   HGB 14.3 05/28/2020   HCT 42.0 05/28/2020   MCV 92.9 05/28/2020   PLT 333 05/28/2020     IMPRESSION AND PLAN:  1) Peripheral edema: acute on chronic. Unclear etiology.  Possible combo of gabapentin and NSAIDs but the recent dramatic worsening (although the timing of the worsening is confusing/not entirely clear to me) makes it hard to blame it on these meds b/c she had been on them together for at least a month. Will have her stop all NSAIDS and stay off gabapentin.  With her possible gradual inc in chronic DOE and her question of PND I'll check CXR and echo.  Check US venous dopplers bilat to r/o DVT.  Will check proBNP, CBC, CMET, PT/INR and TSH today.  An After Visit Summary was printed and given to the patient.  FOLLOW UP: Return in about 2 weeks (around 09/11/2020) for f/u legs swelling.  Signed:  Santiago Bumpers, MD           08/28/2020

## 2020-08-29 ENCOUNTER — Encounter: Payer: Self-pay | Admitting: Family Medicine

## 2020-08-29 LAB — PRO B NATRIURETIC PEPTIDE: NT-Pro BNP: 126 pg/mL (ref 0–249)

## 2020-09-04 ENCOUNTER — Other Ambulatory Visit: Payer: Self-pay

## 2020-09-04 ENCOUNTER — Ambulatory Visit (HOSPITAL_BASED_OUTPATIENT_CLINIC_OR_DEPARTMENT_OTHER)
Admission: RE | Admit: 2020-09-04 | Discharge: 2020-09-04 | Disposition: A | Discharge status: Home / Self Care | Payer: Medicare HMO | Source: Ambulatory Visit | Attending: Family Medicine | Admitting: Family Medicine

## 2020-09-04 DIAGNOSIS — R0600 Dyspnea, unspecified: Secondary | ICD-10-CM | POA: Insufficient documentation

## 2020-09-04 DIAGNOSIS — M7989 Other specified soft tissue disorders: Secondary | ICD-10-CM | POA: Diagnosis not present

## 2020-09-04 DIAGNOSIS — R0602 Shortness of breath: Secondary | ICD-10-CM | POA: Diagnosis not present

## 2020-09-04 DIAGNOSIS — R0609 Other forms of dyspnea: Secondary | ICD-10-CM

## 2020-09-04 DIAGNOSIS — R609 Edema, unspecified: Secondary | ICD-10-CM | POA: Diagnosis not present

## 2020-09-11 ENCOUNTER — Ambulatory Visit: Payer: Medicare HMO | Admitting: Family Medicine

## 2020-09-14 ENCOUNTER — Ambulatory Visit: Payer: Medicare HMO | Admitting: Neurology

## 2020-09-17 ENCOUNTER — Encounter: Payer: Self-pay | Admitting: Family Medicine

## 2020-09-17 ENCOUNTER — Other Ambulatory Visit: Payer: Self-pay

## 2020-09-17 ENCOUNTER — Other Ambulatory Visit: Payer: Self-pay | Admitting: Family Medicine

## 2020-09-17 ENCOUNTER — Ambulatory Visit (HOSPITAL_BASED_OUTPATIENT_CLINIC_OR_DEPARTMENT_OTHER)
Admission: RE | Admit: 2020-09-17 | Discharge: 2020-09-17 | Disposition: A | Discharge status: Home / Self Care | Payer: Medicare HMO | Source: Ambulatory Visit | Attending: Family Medicine | Admitting: Family Medicine

## 2020-09-17 DIAGNOSIS — I340 Nonrheumatic mitral (valve) insufficiency: Secondary | ICD-10-CM

## 2020-09-17 DIAGNOSIS — R0609 Other forms of dyspnea: Secondary | ICD-10-CM

## 2020-09-17 HISTORY — PX: TRANSTHORACIC ECHOCARDIOGRAM: SHX275

## 2020-09-17 HISTORY — DX: Nonrheumatic mitral (valve) insufficiency: I34.0

## 2020-09-17 LAB — ECHOCARDIOGRAM COMPLETE
Area-P 1/2: 5.23 cm2
MV M vel: 5.08 m/s
MV Peak grad: 103.2 mmHg
Radius: 0.7 cm
S' Lateral: 2.43 cm

## 2020-09-17 MED ORDER — FUROSEMIDE 20 MG PO TABS: 20.0000 mg | ORAL_TABLET | Freq: Every day | ORAL | 0 refills | Status: DC

## 2020-09-18 ENCOUNTER — Ambulatory Visit (INDEPENDENT_AMBULATORY_CARE_PROVIDER_SITE_OTHER): Payer: Medicare HMO | Admitting: Family Medicine

## 2020-09-18 ENCOUNTER — Encounter: Payer: Self-pay | Admitting: Family Medicine

## 2020-09-18 VITALS — BP 151/92 | HR 61 | Temp 97.6°F | Resp 16 | Ht 64.5 in | Wt 166.4 lb

## 2020-09-18 DIAGNOSIS — R7301 Impaired fasting glucose: Secondary | ICD-10-CM | POA: Diagnosis not present

## 2020-09-18 DIAGNOSIS — I10 Essential (primary) hypertension: Secondary | ICD-10-CM

## 2020-09-18 DIAGNOSIS — I340 Nonrheumatic mitral (valve) insufficiency: Secondary | ICD-10-CM

## 2020-09-18 DIAGNOSIS — R609 Edema, unspecified: Secondary | ICD-10-CM | POA: Diagnosis not present

## 2020-09-18 DIAGNOSIS — I5189 Other ill-defined heart diseases: Secondary | ICD-10-CM

## 2020-09-18 DIAGNOSIS — E663 Overweight: Secondary | ICD-10-CM

## 2020-09-18 MED ORDER — LOSARTAN POTASSIUM 50 MG PO TABS: 50.0000 mg | ORAL_TABLET | Freq: Every day | ORAL | 1 refills | Status: DC

## 2020-09-18 NOTE — Patient Instructions (Signed)
Take your fluid pill (furosemide) 1 pill AS NEEDED for lower legs swelling. If your legs are not that swollen and uncomfortable then don't take it!  I sent in rx for additional bp med: losartan--follow instructions on the bottle. Continue taking your metoprolol as you have always done.

## 2020-09-18 NOTE — Progress Notes (Signed)
OFFICE VISIT  09/18/2020  CC:  Chief Complaint  Patient presents with   Follow-up    leg swelling, discuss results    HPI:    Patient is a 54 y.o. Caucasian female who presents for 3 week f/u peripheral edema, acute on chronic. A/P as of last visit: "1) Peripheral edema: acute on chronic. Unclear etiology.  Possible combo of gabapentin and NSAIDs but the recent dramatic worsening (although the timing of the worsening is confusing/not entirely clear to me) makes it hard to blame it on these meds b/c she had been on them together for at least a month. Will have her stop all NSAIDS and stay off gabapentin.  With her possible gradual inc in chronic DOE and her question of PND I'll check CXR and echo.  Check US venous dopplers bilat to r/o DVT.  Will check proBNP, CBC, CMET, PT/INR and TSH today."  INTERIM HX: Reviewed all results of workup done since last visit with pt today. All blood work was normal from last visit. CXR normal. Bilat LE venous dopplers NEG for DVT. Echo showed grd II DD +mild/mod MVR.  I had planned on starting her on lasix after I got echo result yesterday so I eRx'd this med yesterday---that's why it shows up on her med list today---but she has NOT taken this med. Feels about the same, mainly tired.  Legs swelling up and down--always with at least a mild amount of LE swelling. Hx chronic HTN: toprol xl 100mg  qd. Home monitoring: 120s-140s over 83-98. Her HR usually runs in the 90s.  ROS: +chronic fatigue.  no fevers, no CP, no SOB, no wheezing, no cough, no dizziness, no HAs, no rashes, no melena/hematochezia.  No polyuria or polydipsia.  +Diffuse myalgias/arthralgias--chronic. No focal weakness, paresthesias, or tremors.  No acute vision or hearing abnormalities. No n/v/d or abd pain.  No palpitations.    Past Medical History:  Diagnosis Date   Allergic rhinitis    Cervical spondylosis 08/2020   C8 radiculopathy->Surgery 08/2020 (Dr. 09/2020)    Diastolic dysfunction 09/2020 echo   grade II   Fibromyalgia    Heart defect    PFO--closure procedure approx 2009 (Dr. 2010)   Hypertension    IFG (impaired fasting glucose) 08/2019   Gluc 106->A1c 5.6%   Insomnia    Migraine syndrome    Mild mitral valve regurgitation 09/17/2020   09/2020 echo-->mild/mod MVR   Scoliosis     Past Surgical History:  Procedure Laterality Date   APPENDECTOMY  6th grade   CARDIAC SURGERY     PFO closure 2009   CERVICAL SPINE SURGERY  remote past; also 08/2020   discectomy x 1, then discectomy with titanium fusion. 08/2020 C spine surg again (Dr. 09/2020)   CHOLECYSTECTOMY  prior to 2010   COLONOSCOPY     "1990s" --reason unknown   FOOT SURGERY  approx 1990   benign mass removed from bottom of foot   PFTs  01/13/2019   Minimal obstructive airway dz; no response to bronchodilator   TRANSTHORACIC ECHOCARDIOGRAM  09/17/2020   EF 60-65%, grd II DD, mild/mod MVR    Outpatient Medications Prior to Visit  Medication Sig Dispense Refill   cetirizine (ZYRTEC) 10 MG tablet Take 10 mg by mouth daily.     fluticasone (FLONASE) 50 MCG/ACT nasal spray Place into both nostrils daily.     furosemide (LASIX) 20 MG tablet Take 1 tablet (20 mg total) by mouth daily. 30 tablet 0   magnesium gluconate (MAGONATE)  500 MG tablet Take 500 mg by mouth 2 (two) times daily.     Melatonin 5 MG TABS Take 1 tablet by mouth daily as needed.      metoprolol succinate (TOPROL-XL) 100 MG 24 hr tablet TAKE 1 TABLET BY MOUTH DAILY WITH A MEAL OR IMMEDIATELY AFTER A MEAL. 60 tablet 0   naratriptan (AMERGE) 2.5 MG tablet Take 2.5 mg by mouth as needed for migraine. Take one (1) tablet at onset of headache; if returns or does not resolve, may repeat after 4 hours; do not exceed five (5) mg in 24 hours.      pantoprazole (PROTONIX) 40 MG tablet Take 1 tablet (40 mg total) by mouth daily. 30 tablet 6   zolpidem (AMBIEN CR) 12.5 MG CR tablet Take 12.5 mg by mouth  at bedtime as needed for sleep.      carisoprodol (SOMA) 350 MG tablet Take 350 mg by mouth 4 (four) times daily as needed.   (Patient not taking: Reported on 09/18/2020)     meloxicam (MOBIC) 15 MG tablet  (Patient not taking: Reported on 09/18/2020)     oxyCODONE-acetaminophen (PERCOCET) 10-325 MG tablet  (Patient not taking: Reported on 09/18/2020)     No facility-administered medications prior to visit.    No Known Allergies  ROS As per HPI  PE: Vitals with BMI 09/18/2020 08/28/2020 07/11/2020  Height 5' 4.5" 5' 4.5" 5' 4.5"  Weight 166 lbs 6 oz 164 lbs 163 lbs 10 oz  BMI 28.13 27.73 27.66  Systolic 151 132 009  Diastolic 92 87 84  Pulse 61 61 53     Gen: Alert, well appearing.  Patient is oriented to person, place, time, and situation. affectn CV: RRR (rate about 60 by me today), no m/r/g.   LUNGS: CTA bilat, nonlabored resps, good aeration in all lung fields. EXT: no clubbing or cyanosis.  1+ bilat LL pitting edema.    LABS:  Lab Results  Component Value Date   TSH 2.52 08/28/2020   Lab Results  Component Value Date   WBC 7.2 08/28/2020   HGB 13.3 08/28/2020   HCT 39.5 08/28/2020   MCV 93.8 08/28/2020   PLT 354.0 08/28/2020   Lab Results  Component Value Date   CREATININE 1.05 08/28/2020   BUN 33 (H) 08/28/2020   NA 135 08/28/2020   K 4.4 08/28/2020   CL 102 08/28/2020   CO2 25 08/28/2020   Lab Results  Component Value Date   ALT 26 08/28/2020   AST 15 08/28/2020   ALKPHOS 103 08/28/2020   BILITOT 0.3 08/28/2020   Lab Results  Component Value Date   CHOL 171 08/11/2019   Lab Results  Component Value Date   HDL 43.30 08/11/2019   Lab Results  Component Value Date   LDLCALC 108 (H) 08/11/2019   Lab Results  Component Value Date   TRIG 98.0 08/11/2019   Lab Results  Component Value Date   CHOLHDL 4 08/11/2019   Lab Results  Component Value Date   HGBA1C 5.6 08/12/2019    IMPRESSION AND PLAN:  1) Diastolic dysfunction, grd II:  suspect this is main reason for her LE edema. Discussed need for low Na intake and good bp control. Also discussed use of lasix, which I want her to take (20mg  qd) on a PRN basis only, if possible. Refer to cardiologist->Dr. did her PFO closure about 10 yrs ago and we'll get her back with him.  2) HTN, not well controlled (  130s/90 avg):  HR too low today to push toprol xl dosing (although pt states her HR is typically in the 80s-90s). Will cautiously add ARB in the context of CRI II (plus recently starting lasix). Losartan 50mg  qd. BMET 1 week. F/u to review home bp monitoring in 1 mo.  3) Mild/mod MVR: on recent echo. Ref to cardiologist as per #1 above.  4) Overweight: nutritionist referral today per pt request (HTN, DD/peripheral edema, IFG).  An After Visit Summary was printed and given to the patient.  FOLLOW UP: Return in about 4 weeks (around 10/16/2020) for f/u HTN/periph edema.  Signed:  13/08/2020, MD           09/18/2020

## 2020-09-25 ENCOUNTER — Ambulatory Visit (INDEPENDENT_AMBULATORY_CARE_PROVIDER_SITE_OTHER): Payer: Medicare HMO

## 2020-09-25 DIAGNOSIS — I340 Nonrheumatic mitral (valve) insufficiency: Secondary | ICD-10-CM | POA: Diagnosis not present

## 2020-09-25 DIAGNOSIS — Z9889 Other specified postprocedural states: Secondary | ICD-10-CM | POA: Diagnosis not present

## 2020-09-25 DIAGNOSIS — I5189 Other ill-defined heart diseases: Secondary | ICD-10-CM | POA: Diagnosis not present

## 2020-09-25 DIAGNOSIS — I10 Essential (primary) hypertension: Secondary | ICD-10-CM | POA: Diagnosis not present

## 2020-09-25 LAB — PROTIME-INR
INR: 1 ratio (ref 0.8–1.0)
Prothrombin Time: 11.5 s (ref 9.6–13.1)

## 2020-09-25 LAB — COMPREHENSIVE METABOLIC PANEL
ALT: 13 U/L (ref 0–35)
AST: 13 U/L (ref 0–37)
Albumin: 4.1 g/dL (ref 3.5–5.2)
Alkaline Phosphatase: 103 U/L (ref 39–117)
BUN: 21 mg/dL (ref 6–23)
CO2: 31 mEq/L (ref 19–32)
Calcium: 8.9 mg/dL (ref 8.4–10.5)
Chloride: 100 mEq/L (ref 96–112)
Creatinine, Ser: 0.95 mg/dL (ref 0.40–1.20)
GFR: 68.64 mL/min (ref >60.00)
Glucose, Bld: 100 mg/dL — ABNORMAL HIGH (ref 70–99)
Potassium: 3.8 mEq/L (ref 3.5–5.1)
Sodium: 138 mEq/L (ref 135–145)
Total Bilirubin: 0.3 mg/dL (ref 0.2–1.2)
Total Protein: 6.9 g/dL (ref 6.0–8.3)

## 2020-09-25 LAB — CBC
HCT: 40.2 % (ref 36.0–46.0)
Hemoglobin: 13.7 g/dL (ref 12.0–15.0)
MCHC: 34 g/dL (ref 30.0–36.0)
MCV: 92.8 fl (ref 78.0–100.0)
Platelets: 306 10*3/uL (ref 150.0–400.0)
RBC: 4.34 Mil/uL (ref 3.87–5.11)
RDW: 13.1 % (ref 11.5–15.5)
WBC: 7 10*3/uL (ref 4.0–10.5)

## 2020-09-25 LAB — BRAIN NATRIURETIC PEPTIDE: Pro B Natriuretic peptide (BNP): 25 pg/mL (ref 0.0–100.0)

## 2020-09-25 LAB — TSH: TSH: 1.79 u[IU]/mL (ref 0.35–4.50)

## 2020-09-26 ENCOUNTER — Other Ambulatory Visit: Payer: Self-pay

## 2020-09-26 MED ORDER — POTASSIUM CHLORIDE CRYS ER 20 MEQ PO TBCR: 20.0000 meq | EXTENDED_RELEASE_TABLET | Freq: Every day | ORAL | 5 refills | Status: DC

## 2020-09-27 ENCOUNTER — Encounter: Payer: Self-pay | Admitting: Cardiology

## 2020-09-27 ENCOUNTER — Other Ambulatory Visit: Payer: Self-pay

## 2020-09-27 ENCOUNTER — Ambulatory Visit: Payer: Medicare HMO | Admitting: Cardiology

## 2020-09-27 VITALS — BP 126/83 | HR 80 | Ht 65.0 in | Wt 166.0 lb

## 2020-09-27 DIAGNOSIS — R0609 Other forms of dyspnea: Secondary | ICD-10-CM | POA: Diagnosis not present

## 2020-09-27 DIAGNOSIS — I5189 Other ill-defined heart diseases: Secondary | ICD-10-CM | POA: Diagnosis not present

## 2020-09-27 DIAGNOSIS — R072 Precordial pain: Secondary | ICD-10-CM | POA: Diagnosis not present

## 2020-09-27 DIAGNOSIS — I340 Nonrheumatic mitral (valve) insufficiency: Secondary | ICD-10-CM

## 2020-09-27 DIAGNOSIS — I10 Essential (primary) hypertension: Secondary | ICD-10-CM

## 2020-09-27 NOTE — Progress Notes (Signed)
Date:  09/27/2020   ID:  Courtney Haynes, DOB 1966/06/29, MRN [id number]  PCP:  Courtney Massed, MD  Cardiologist:  Courtney Lerner, DO, [facility] (established care 09/27/2020) Former Cardiology Providers: Dr. Yates Haynes  REASON FOR CONSULT: Grade II Diastolic Dysfunction and Mild MR  REQUESTING PHYSICIAN:  Courtney Massed, MD [address] [place],  [place] [address]  Chief Complaint  Patient presents with  . Mitral Regurgitation  . New Patient (Initial Visit)    HPI  Courtney Haynes is a 54 y.o. female who presents to the office with a chief complaint of "Chest pain, shortness of breath, and LE swelling." Patient's past medical history and cardiovascular risk factors include: History of severe migraines status post PFO closure 2009 with Dr. Jacinto Haynes, Hypertension, Mild MR, diastolic dysfunction.  She is referred to the office at the request of McGowen, Courtney Morn, MD for evaluation of grade 2 diastolic dysfunction and MR..  Patient stated she been experiencing chest pain for several weeks.  Discomfort is substernally located, intermittent, at times worse with effort related to these and at times occurs randomly, not improved with resting, usually self-limited.  The discomfort is nonradiating.  Patient started Motrin over-the-counter with no significant improvement in her symptoms.  Associated symptoms include dyspnea on exertion and lower extremity swelling.  She was evaluated by her PCP in the recent past echocardiogram ordered which noted preserved LVEF, grade 2 diastolic impairment, with mild mitral regurgitation.  She was started on diuretic therapy and her lower extremity swelling improved. She takes Lasix on a as needed basis.  And she was also started on losartan recently by her PCP to help improve her blood pressures.  Prior to the COVID-19 pandemic patient states that she will walk 3 miles.  However, she started walking recently but her duration is reviewed and symptoms of fatigue and  shortness of breath.  Denies prior history of coronary artery disease, myocardial infarction, congestive heart failure, deep venous thrombosis, pulmonary embolism, stroke, transient ischemic attack.  ALLERGIES: No Known Allergies  MEDICATION LIST PRIOR TO VISIT: Current Meds  Medication Sig  . carisoprodol (SOMA) 350 MG tablet Take 1 tablet by mouth 4 (four) times daily as needed.  . cetirizine (ZYRTEC) 10 MG tablet Take 10 mg by mouth daily.  . fluticasone (FLONASE) 50 MCG/ACT nasal spray Place into both nostrils daily.  . furosemide (LASIX) 20 MG tablet Take 1 tablet (20 mg total) by mouth daily.  Marland Kitchen losartan (COZAAR) 50 MG tablet Take 1 tablet (50 mg total) by mouth daily.  . magnesium gluconate (MAGONATE) 500 MG tablet Take 500 mg by mouth 2 (two) times daily.  . Melatonin 5 MG TABS Take 10 mg by mouth daily as needed.   . metoprolol succinate (TOPROL-XL) 100 MG 24 hr tablet TAKE 1 TABLET BY MOUTH DAILY WITH A MEAL OR IMMEDIATELY AFTER A MEAL.  . naratriptan (AMERGE) 2.5 MG tablet Take 2.5 mg by mouth as needed for migraine. Take one (1) tablet at onset of headache; if returns or does not resolve, may repeat after 4 hours; do not exceed five (5) mg in 24 hours.   . pantoprazole (PROTONIX) 40 MG tablet Take 1 tablet (40 mg total) by mouth daily.  . potassium chloride SA (KLOR-CON) 20 MEQ tablet Take 1 tablet (20 mEq total) by mouth daily.  Marland Kitchen zolpidem (AMBIEN CR) 12.5 MG CR tablet Take 12.5 mg by mouth at bedtime as needed for sleep.  PAST MEDICAL HISTORY: Past Medical History:  Diagnosis Date  . Allergic rhinitis   . Cervical spondylosis 08/2020   C8 radiculopathy->Surgery 08/2020 (Dr. Yetta Haynes)  . Chronic renal insufficiency, stage 3 (moderate) (HCC)    GFR 50s  . Diastolic dysfunction 09/2020 echo   grade II  . Fibromyalgia   . Heart defect    PFO--closure procedure approx 2009 (Dr. Jacinto Haynes)  . Hypertension   . IFG (impaired fasting glucose) 08/2019   Gluc 106->A1c 5.6%  .  Insomnia   . Migraine syndrome   . Mild mitral valve regurgitation 09/17/2020   09/2020 echo-->mild/mod MVR  . Scoliosis     PAST SURGICAL HISTORY: Past Surgical History:  Procedure Laterality Date  . APPENDECTOMY  6th grade  . CARDIAC SURGERY     PFO closure 2009  . CERVICAL SPINE SURGERY  remote past; also 08/2020   discectomy x 1, then discectomy with titanium fusion. 08/2020 C spine surg again (Dr. Yetta Haynes)  . CHOLECYSTECTOMY  prior to 2010  . COLONOSCOPY     "1990s" --reason unknown  . FOOT SURGERY  approx 1990   benign mass removed from bottom of foot  . PFTs  01/13/2019   Minimal obstructive airway dz; no response to bronchodilator  . TRANSTHORACIC ECHOCARDIOGRAM  09/17/2020   EF 60-65%, grd II DD, mild/mod MVR    FAMILY HISTORY: The patient family history includes Diabetes in her father; Heart disease in her father and sister; Kidney disease in her father; Stroke in her father.  SOCIAL HISTORY:  The patient  reports that she has never smoked. She has never used smokeless tobacco. She reports that she does not drink alcohol and does not use drugs.  REVIEW OF SYSTEMS: Review of Systems  Constitutional: Negative for chills and fever.  HENT: Negative for hoarse voice and nosebleeds.   Eyes: Negative for discharge, double vision and pain.  Cardiovascular: Positive for chest pain, dyspnea on exertion and leg swelling. Negative for claudication, near-syncope, orthopnea, palpitations, paroxysmal nocturnal dyspnea and syncope.  Respiratory: Positive for shortness of breath. Negative for hemoptysis.   Musculoskeletal: Negative for muscle cramps and myalgias.  Gastrointestinal: Negative for abdominal pain, constipation, diarrhea, hematemesis, hematochezia, melena, nausea and vomiting.  Neurological: Negative for dizziness and light-headedness.   PHYSICAL EXAM: Vitals with BMI 09/27/2020 09/18/2020 08/28/2020  Height  5' 4.5" 5' 4.5"  Weight 166 lbs 166 lbs 6 oz 164 lbs    BMI 27.62 28.13 27.73  Systolic 126 151 161  Diastolic 83 92 87  Pulse 80 61 61    CONSTITUTIONAL: Well-developed and well-nourished. No acute distress.  SKIN: Skin is warm and dry. No rash noted. No cyanosis. No pallor. No jaundice HEAD: Normocephalic and atraumatic.  EYES: No scleral icterus MOUTH/THROAT: Moist oral membranes.  NECK: No JVD present. No thyromegaly noted. No carotid bruits  LYMPHATIC: No visible cervical adenopathy.  CHEST Normal respiratory effort. No intercostal retractions  LUNGS: Clear to auscultation bilaterally.  No stridor. No wheezes. No rales.  CARDIOVASCULAR: Regular, positive S1-S2, soft holosystolic murmur heard at the apex, no gallops or rubs ABDOMINAL: No apparent ascites.  EXTREMITIES: No peripheral edema, 2+ dorsalis pedis and posterior tibial pulses HEMATOLOGIC: No significant bruising NEUROLOGIC: Oriented to person, place, and time. Nonfocal. Normal muscle tone.  PSYCHIATRIC: Normal mood and affect. Normal behavior. Cooperative  CXR: 09/05/2020: No acute abnormalities noted.   CARDIAC DATABASE: EKG: 09/27/2020: Normal sinus.   Echocardiogram: 09/17/2020: 1. Left ventricular ejection fraction, by estimation, is 60 to 65%. The  left ventricle has normal function. The left ventricle has no regional wall motion abnormalities. Left ventricular diastolic parameters are  consistent with Grade II diastolic dysfunction (pseudonormalization).  2. Left atrial size was mildly dilated.  3. The mitral valve is normal in structure. Mild to moderate mitral valve regurgitation. No evidence of mitral stenosis.    Stress Testing: No results found for this or any previous visit from the past 1095 days.  Heart Catheterization: None   Ultrasound venous duplex: 09/04/2020: No evidence of deep venous thrombosis in either lower extremity.  LABORATORY DATA: CBC Latest Ref Rng & Units 09/25/2020 08/28/2020 05/28/2020  WBC 4.0 - 10.5 K/uL 7.0 7.2 7.1   Hemoglobin 12.0 - 15.0 g/dL 23.5 36.1 44.3  Hematocrit 36 - 46 % 40.2 39.5 42.0  Platelets 150 - 400 K/uL 306.0 354.0 333    CMP Latest Ref Rng & Units 09/25/2020 08/28/2020 05/28/2020  Glucose 70 - 99 mg/dL 154(M) 94 086(P)  BUN 6 - 23 mg/dL 21 61(P) 17  Creatinine 0.40 - 1.20 mg/dL 5.09 3.26 7.12(W)  Sodium 135 - 145 mEq/L 138 135 138  Potassium 3.5 - 5.1 mEq/L 3.8 4.4 4.1  Chloride 96 - 112 mEq/L 100 102 105  CO2 19 - 32 mEq/L 31 25 24   Calcium 8.4 - 10.5 mg/dL 8.9 9.3 9.2  Total Protein 6.0 - 8.3 g/dL 6.9 6.9 8.0  Total Bilirubin 0.2 - 1.2 mg/dL 0.3 0.3 0.5  Alkaline Phos 39 - 117 U/L 103 103 82  AST 0 - 37 U/L 13 15 16   ALT 0 - 35 U/L 13 26 19     Lipid Panel     Component Value Date/Time   CHOL 171 08/11/2019 1107   TRIG 98.0 08/11/2019 1107   HDL 43.30 08/11/2019 1107   CHOLHDL 4 08/11/2019 1107   VLDL 19.6 08/11/2019 1107   LDLCALC 108 (H) 08/11/2019 1107    No components found for: NTPROBNP Recent Labs    08/28/20 1223 09/25/20 1059  PROBNP 126 25.0   Recent Labs    08/28/20 1223 09/25/20 1059  TSH 2.52 1.79    BMP Recent Labs    05/28/20 1413 08/28/20 1223 09/25/20 1059  NA 138 135 138  K 4.1 4.4 3.8  CL 105 102 100  CO2 24 25 31   GLUCOSE 103* 94 100*  BUN 17 33* 21  CREATININE 1.06* 1.05 0.95  CALCIUM 9.2 9.3 8.9  GFRNONAA >60  --   --   GFRAA >60  --   --     HEMOGLOBIN A1C Lab Results  Component Value Date   HGBA1C 5.6 08/12/2019    IMPRESSION:    ICD-10-CM   1. Grade II diastolic dysfunction  I51.89   2. Nonrheumatic mitral valve regurgitation  I34.0 EKG 12-Lead  3. Precordial chest pain  R07.2 PCV MYOCARDIAL PERFUSION WO LEXISCAN    CT CARDIAC SCORING    SARS-COV-2 RNA,(COVID-19) QUAL NAAT    Pregnancy, urine    Lipid Panel With LDL/HDL Ratio    LDL cholesterol, direct  4. Dyspnea on exertion  R06.09 PCV MYOCARDIAL PERFUSION WO LEXISCAN    CT CARDIAC SCORING    SARS-COV-2 RNA,(COVID-19) QUAL NAAT    Pregnancy, urine   5. Benign hypertension  I10      RECOMMENDATIONS: THRESSA SHIFFER is a 54 y.o. female whose past medical history and cardiac risk factors include: History of severe migraines status post PFO closure 2009 with Dr. 10/12/2019, Hypertension, Mild MR, diastolic dysfunction.  Grade  2 diastolic dysfunction:  Patient states her symptoms of shortness of breath and lower extremity swelling have improved but not resolved since initiation of Lasix and losartan.  Clinically patient is euvolemic.  Denies orthopnea, paroxysmal nocturnal dyspnea.  proBNP mildly elevated in September 2021 which is now within normal limits.  Continue current medical therapy.  Mild mitral regurgitation: Patient is currently asymptomatic continue to monitor.  Educated on importance of a low-salt diet and blood pressure control.  Precordial chest pain:  Patient symptoms of chest discomfort appear to be typical and atypical as noted above.  EKG shows normal sinus rhythm.  Would recommend exercise nuclear stress test to evaluate underlying functional status reversible ischemia.  Patient will need to be screened for COVID-19 prior to stress testing as she is not vaccinated.  Cardiac calcium scoring.  Fasting lipid profile  Educated on importance of improving her modifiable cardiovascular risk factors.  Dyspnea on exertion: See above  Benign essential hypertension: Office blood pressure is very well controlled.  Currently managed by PCP.   FINAL MEDICATION LIST END OF ENCOUNTER: No orders of the defined types were placed in this encounter.   There are no discontinued medications.   Current Outpatient Medications:  .  carisoprodol (SOMA) 350 MG tablet, Take 1 tablet by mouth 4 (four) times daily as needed., Disp: , Rfl:  .  cetirizine (ZYRTEC) 10 MG tablet, Take 10 mg by mouth daily., Disp: , Rfl:  .  fluticasone (FLONASE) 50 MCG/ACT nasal spray, Place into both nostrils daily., Disp: , Rfl:  .  furosemide  (LASIX) 20 MG tablet, Take 1 tablet (20 mg total) by mouth daily., Disp: 30 tablet, Rfl: 0 .  losartan (COZAAR) 50 MG tablet, Take 1 tablet (50 mg total) by mouth daily., Disp: 30 tablet, Rfl: 1 .  magnesium gluconate (MAGONATE) 500 MG tablet, Take 500 mg by mouth 2 (two) times daily., Disp: , Rfl:  .  Melatonin 5 MG TABS, Take 10 mg by mouth daily as needed. , Disp: , Rfl:  .  metoprolol succinate (TOPROL-XL) 100 MG 24 hr tablet, TAKE 1 TABLET BY MOUTH DAILY WITH A MEAL OR IMMEDIATELY AFTER A MEAL., Disp: 60 tablet, Rfl: 0 .  naratriptan (AMERGE) 2.5 MG tablet, Take 2.5 mg by mouth as needed for migraine. Take one (1) tablet at onset of headache; if returns or does not resolve, may repeat after 4 hours; do not exceed five (5) mg in 24 hours. , Disp: , Rfl:  .  pantoprazole (PROTONIX) 40 MG tablet, Take 1 tablet (40 mg total) by mouth daily., Disp: 30 tablet, Rfl: 6 .  potassium chloride SA (KLOR-CON) 20 MEQ tablet, Take 1 tablet (20 mEq total) by mouth daily., Disp: 30 tablet, Rfl: 5 .  zolpidem (AMBIEN CR) 12.5 MG CR tablet, Take 12.5 mg by mouth at bedtime as needed for sleep. , Disp: , Rfl:   Orders Placed This Encounter  Procedures  . SARS-COV-2 RNA,(COVID-19) QUAL NAAT  . CT CARDIAC SCORING  . Pregnancy, urine  . Lipid Panel With LDL/HDL Ratio  . LDL cholesterol, direct  . PCV MYOCARDIAL PERFUSION WO LEXISCAN  . EKG 12-Lead    There are no Patient Instructions on file for this visit.   --Continue cardiac medications as reconciled in final medication list. --Return in about 4 weeks (around 10/25/2020) for Reevaluation of, Dyspnea, Review test results. Or sooner if needed. --Continue follow-up with your primary care physician regarding the management of your other chronic comorbid conditions.  Patient's  questions and concerns were addressed to her satisfaction. She voices understanding of the instructions provided during this encounter.   This note was created using a voice  recognition software as a result there may be grammatical errors inadvertently enclosed that do not reflect the nature of this encounter. Every attempt is made to correct such errors.  Courtney Haynes, [place], [facility]  Pager: [phone] Office: [phone]

## 2020-09-28 DIAGNOSIS — R072 Precordial pain: Secondary | ICD-10-CM | POA: Diagnosis not present

## 2020-09-28 DIAGNOSIS — R0609 Other forms of dyspnea: Secondary | ICD-10-CM | POA: Diagnosis not present

## 2020-09-29 LAB — PREGNANCY, URINE: Preg Test, Ur: NEGATIVE

## 2020-09-29 LAB — LIPID PANEL WITH LDL/HDL RATIO
Cholesterol, Total: 192 mg/dL (ref 100–199)
HDL: 40 mg/dL (ref >39)
LDL Chol Calc (NIH): 116 mg/dL — ABNORMAL HIGH (ref 0–99)
LDL/HDL Ratio: 2.9 ratio (ref 0.0–3.2)
Triglycerides: 204 mg/dL — ABNORMAL HIGH (ref 0–149)
VLDL Cholesterol Cal: 36 mg/dL (ref 5–40)

## 2020-09-29 LAB — LDL CHOLESTEROL, DIRECT: LDL Direct: 117 mg/dL — ABNORMAL HIGH (ref 0–99)

## 2020-10-05 ENCOUNTER — Other Ambulatory Visit: Payer: Self-pay | Admitting: Family Medicine

## 2020-10-06 ENCOUNTER — Other Ambulatory Visit (HOSPITAL_COMMUNITY)
Admission: RE | Admit: 2020-10-06 | Discharge: 2020-10-06 | Disposition: A | Discharge status: Home / Self Care | Payer: Medicare HMO | Source: Ambulatory Visit | Attending: Cardiology | Admitting: Cardiology

## 2020-10-06 DIAGNOSIS — Z20822 Contact with and (suspected) exposure to covid-19: Secondary | ICD-10-CM | POA: Diagnosis not present

## 2020-10-06 DIAGNOSIS — Z01812 Encounter for preprocedural laboratory examination: Secondary | ICD-10-CM | POA: Diagnosis not present

## 2020-10-07 LAB — SARS CORONAVIRUS 2 (TAT 6-24 HRS): SARS Coronavirus 2: NEGATIVE

## 2020-10-10 ENCOUNTER — Other Ambulatory Visit: Payer: Self-pay

## 2020-10-10 ENCOUNTER — Ambulatory Visit: Payer: Medicare HMO

## 2020-10-10 DIAGNOSIS — R0609 Other forms of dyspnea: Secondary | ICD-10-CM

## 2020-10-10 DIAGNOSIS — R072 Precordial pain: Secondary | ICD-10-CM | POA: Diagnosis not present

## 2020-10-10 HISTORY — PX: CARDIOVASCULAR STRESS TEST: SHX262

## 2020-10-15 ENCOUNTER — Other Ambulatory Visit: Payer: Self-pay | Admitting: Family Medicine

## 2020-10-16 ENCOUNTER — Encounter: Payer: Self-pay | Admitting: Family Medicine

## 2020-10-25 ENCOUNTER — Ambulatory Visit: Payer: Medicare HMO | Admitting: Cardiology

## 2020-10-25 ENCOUNTER — Encounter: Payer: Self-pay | Admitting: Cardiology

## 2020-10-25 ENCOUNTER — Other Ambulatory Visit: Payer: Self-pay

## 2020-10-25 VITALS — BP 112/77 | HR 84 | Ht 65.0 in | Wt 163.0 lb

## 2020-10-25 DIAGNOSIS — I340 Nonrheumatic mitral (valve) insufficiency: Secondary | ICD-10-CM | POA: Diagnosis not present

## 2020-10-25 DIAGNOSIS — Z712 Person consulting for explanation of examination or test findings: Secondary | ICD-10-CM | POA: Diagnosis not present

## 2020-10-25 DIAGNOSIS — R072 Precordial pain: Secondary | ICD-10-CM

## 2020-10-25 DIAGNOSIS — R0609 Other forms of dyspnea: Secondary | ICD-10-CM

## 2020-10-25 DIAGNOSIS — I10 Essential (primary) hypertension: Secondary | ICD-10-CM | POA: Diagnosis not present

## 2020-10-25 NOTE — Progress Notes (Signed)
Date:  10/25/2020   ID:  Courtney Haynes, DOB September 10, 1966, MRN [id number]  PCP:  Jeoffrey Massed, MD  Cardiologist:  Tessa Lerner, DO, [facility] (established care 09/27/2020) Former Cardiology Providers: Dr. Yates Decamp  Date: 10/25/20 Last Office Visit: 09/27/2020  Chief Complaint  Patient presents with  . Follow-up  . Chest Pain  . Shortness of Breath    HPI  Courtney Haynes is a 54 y.o. female who presents to the office with a chief complaint of "Chest pain, shortness of breath, and review test results." Patient's past medical history and cardiovascular risk factors include: History of severe migraines status post PFO closure 2009 with Dr. Jacinto Halim, Hypertension, Mild MR, diastolic dysfunction.  She is referred to the office at the request of McGowen, Maryjean Morn, MD for evaluation of grade 2 diastolic dysfunction and MR..  During last office visit patient was experiencing chest discomfort and dyspnea on exertion and therefore shared decision was to proceed with an ischemic evaluation.  Since last office visit patient states that her chest pain is still present but less intense.  Located over the left anterior chest wall, brought on by effort related activities and does resolve with rest.  Patient was originally scheduled for an exercise nuclear stress test; however, because the patient did not achieve 85% of maximum predicted heart rate she was transition to Lexiscan stress test.  Lexiscan noted normal myocardial perfusion and overall a low risk study.  Patient states that to the best of her knowledge she did stop the metoprolol prior to her exercise nuclear stress test but if not that may have contributed to her not achieving 85% of maximum predicted heart rate.  Given her dyspnea on exertion also checked pro BNP levels.  Independently reviewed the labs from 09/25/2020 as well as 09/28/2020.  Her NT proBNP was 25 and within normal limits.  Patient's lipid profile reviewed with her in great detail.   Patient states that she will work on lifestyle changes prior to considering pharmacological therapy.  Given the patient's symptoms of chest pain, dyspnea on exertion, and lipid profile she is recommended to undergo coronary artery calcification scoring for further restratification.  However, patient did not have this done because it was an out-of-pocket expense to her and not covered by her insurance.  ALLERGIES: No Known Allergies  MEDICATION LIST PRIOR TO VISIT: Current Meds  Medication Sig  . carisoprodol (SOMA) 350 MG tablet Take 1 tablet by mouth 4 (four) times daily as needed.  . cetirizine (ZYRTEC) 10 MG tablet Take 10 mg by mouth daily.  . fluticasone (FLONASE) 50 MCG/ACT nasal spray Place into both nostrils daily.  . furosemide (LASIX) 20 MG tablet TAKE 1 TABLET(20 MG) BY MOUTH DAILY  . losartan (COZAAR) 50 MG tablet Take 1 tablet (50 mg total) by mouth daily.  . Melatonin 5 MG TABS Take 10 mg by mouth daily as needed.   . metoprolol succinate (TOPROL-XL) 100 MG 24 hr tablet TAKE 1 TABLET BY MOUTH DAILY WITH A MEAL OR IMMEDIATELY AFTER A MEAL.  . naratriptan (AMERGE) 2.5 MG tablet Take 2.5 mg by mouth as needed for migraine. Take one (1) tablet at onset of headache; if returns or does not resolve, may repeat after 4 hours; do not exceed five (5) mg in 24 hours.   . pantoprazole (PROTONIX) 40 MG tablet Take 1 tablet (40 mg total) by mouth daily.  . potassium chloride SA (KLOR-CON) 20 MEQ tablet Take 1 tablet (20 mEq total)  by mouth daily.  Marland Kitchen zolpidem (AMBIEN CR) 12.5 MG CR tablet Take 12.5 mg by mouth at bedtime as needed for sleep.      PAST MEDICAL HISTORY: Past Medical History:  Diagnosis Date  . Allergic rhinitis   . Cervical spondylosis 08/2020   C8 radiculopathy->Surgery 08/2020 (Dr. Yetta Barre)  . Chronic renal insufficiency, stage 3 (moderate) (HCC)    GFR 50s  . Diastolic dysfunction 09/2020 echo   grade II  . Fibromyalgia   . Heart defect    PFO--closure procedure  approx 2009 (Dr. Jacinto Halim)  . Hypertension   . IFG (impaired fasting glucose) 08/2019   Gluc 106->A1c 5.6%  . Insomnia   . Migraine syndrome   . Mild mitral valve regurgitation 09/17/2020   09/2020 echo-->mild/mod MVR  . Scoliosis     PAST SURGICAL HISTORY: Past Surgical History:  Procedure Laterality Date  . APPENDECTOMY  6th grade  . CARDIAC SURGERY     PFO closure 2009  . CARDIOVASCULAR STRESS TEST  10/10/2020   chronotropic incompetence.  No ischemia, normal LV fxn, normal wall motion, EF 70%.  . CERVICAL SPINE SURGERY  remote past; also 08/2020   discectomy x 1, then discectomy with titanium fusion. 08/2020 C spine surg again (Dr. Yetta Barre)  . CHOLECYSTECTOMY  prior to 2010  . COLONOSCOPY     "1990s" --reason unknown  . FOOT SURGERY  approx 1990   benign mass removed from bottom of foot  . PFTs  01/13/2019   Minimal obstructive airway dz; no response to bronchodilator  . TRANSTHORACIC ECHOCARDIOGRAM  09/17/2020   EF 60-65%, grd II DD, mild/mod MVR    FAMILY HISTORY: The patient family history includes Diabetes in her father; Heart disease in her father and sister; Kidney disease in her father; Stroke in her father.  SOCIAL HISTORY:  The patient  reports that she has never smoked. She has never used smokeless tobacco. She reports that she does not drink alcohol and does not use drugs.  REVIEW OF SYSTEMS: Review of Systems  Constitutional: Negative for chills and fever.  HENT: Negative for hoarse voice and nosebleeds.   Eyes: Negative for discharge, double vision and pain.  Cardiovascular: Positive for chest pain and dyspnea on exertion. Negative for claudication, leg swelling, near-syncope, orthopnea, palpitations, paroxysmal nocturnal dyspnea and syncope.  Respiratory: Positive for shortness of breath. Negative for hemoptysis.   Musculoskeletal: Negative for muscle cramps and myalgias.  Gastrointestinal: Negative for abdominal pain, constipation, diarrhea, hematemesis,  hematochezia, melena, nausea and vomiting.  Neurological: Negative for dizziness and light-headedness.   PHYSICAL EXAM: Vitals with BMI 10/25/2020 09/27/2020 09/18/2020  Height 5\' 5"  5\' 5"  5' 4.5"  Weight 163 lbs 166 lbs 166 lbs 6 oz  BMI 27.12 27.62 28.13  Systolic 112 126  Diastolic 77 83 92  Pulse 84 80 61    CONSTITUTIONAL: Well-developed and well-nourished. No acute distress.  SKIN: Skin is warm and dry. No rash noted. No cyanosis. No pallor. No jaundice HEAD: Normocephalic and atraumatic.  EYES: No scleral icterus MOUTH/THROAT: Moist oral membranes.  NECK: No JVD present. No thyromegaly noted. No carotid bruits  LYMPHATIC: No visible cervical adenopathy.  CHEST Normal respiratory effort. No intercostal retractions  LUNGS: Clear to auscultation bilaterally.  No stridor. No wheezes. No rales.  CARDIOVASCULAR: Regular, positive S1-S2, soft holosystolic murmur heard at the apex, no gallops or rubs ABDOMINAL: No apparent ascites.  EXTREMITIES: No peripheral edema, 2+ dorsalis pedis and posterior tibial pulses HEMATOLOGIC: No significant bruising NEUROLOGIC: Oriented  to person, place, and time. Nonfocal. Normal muscle tone.  PSYCHIATRIC: Normal mood and affect. Normal behavior. Cooperative  CXR: 09/05/2020: No acute abnormalities noted.   CARDIAC DATABASE: EKG: 09/27/2020: Normal sinus.   Echocardiogram: 09/17/2020: 1. Left ventricular ejection fraction, by estimation, is 60 to 65%. The left ventricle has normal function. The left ventricle has no regional wall motion abnormalities. Left ventricular diastolic parameters are  consistent with Grade II diastolic dysfunction (pseudonormalization).  2. Left atrial size was mildly dilated.  3. The mitral valve is normal in structure. Mild to moderate mitral valve regurgitation. No evidence of mitral stenosis.    Stress Testing: Lexiscan (Walking with Bruce protocol) Sestamibi Stress Test 10/10/2020:  Nondiagnostic ECG  stress. Chronotropic incompetence. Patient exercised on a Bruce protocol. Baseline heart rate was 62 bpm. A maximum heart rate of 121 beats per minute was achieved, which is 72% of the maximum predicted heart rate response. Stress converted to Lexiscan. Exercise duration 10 minutes, METS achieved 10.16, symptoms of dyspnea. No ST-T changes of ischemia at peak exercise.  Myocardial perfusion is normal.  Overall LV systolic function is normal without regional wall motion abnormalities. Stress LV EF: 70%.  No previous exam available for comparison. Low risk.   Heart Catheterization: None   Ultrasound venous duplex: 09/04/2020: No evidence of deep venous thrombosis in either lower extremity.  LABORATORY DATA: CBC Latest Ref Rng & Units 09/25/2020 08/28/2020 05/28/2020  WBC 4.0 - 10.5 K/uL 7.0 7.2 7.1  Hemoglobin 12.0 - 15.0 g/dL 78.2 95.6 21.3  Hematocrit 36 - 46 % 40.2 39.5 42.0  Platelets 150 - 400 K/uL 306.0 354.0 333    CMP Latest Ref Rng & Units 09/25/2020 08/28/2020 05/28/2020  Glucose 70 - 99 mg/dL 086(V) 94 784(O)  BUN 6 - 23 mg/dL 21 96(E) 17  Creatinine 0.40 - 1.20 mg/dL 9.52 8.41 3.24(M)  Sodium 135 - 145 mEq/L 138 135 138  Potassium 3.5 - 5.1 mEq/L 3.8 4.4 4.1  Chloride 96 - 112 mEq/L 100 102 105  CO2 19 - 32 mEq/L 31 25 24   Calcium 8.4 - 10.5 mg/dL 8.9 9.3 9.2  Total Protein 6.0 - 8.3 g/dL 6.9 6.9 8.0  Total Bilirubin 0.2 - 1.2 mg/dL 0.3 0.3 0.5  Alkaline Phos 39 - 117 U/L 103 103 82  AST 0 - 37 U/L 13 15 16   ALT 0 - 35 U/L 13 26 19     Lipid Panel     Component Value Date/Time   CHOL 192 09/28/2020 0924   TRIG 204 (H) 09/28/2020 0924   HDL 40 09/28/2020 0924   CHOLHDL 4 08/11/2019 1107   VLDL 19.6 08/11/2019 1107   LDLCALC 116 (H) 09/28/2020 0924   LDLDIRECT 117 (H) 09/28/2020 0924   LABVLDL 36 09/28/2020 0924    No components found for: NTPROBNP Recent Labs    08/28/20 1223 09/25/20 1059  PROBNP 126 25.0   Recent Labs    08/28/20 1223 09/25/20 1059    TSH 2.52 1.79    BMP Recent Labs    05/28/20 1413 08/28/20 1223 09/25/20 1059  NA 138 135 138  K 4.1 4.4 3.8  CL 105 102 100  CO2 24 25 31   GLUCOSE 103* 94 100*  BUN 17 33* 21  CREATININE 1.06* 1.05 0.95  CALCIUM 9.2 9.3 8.9  GFRNONAA >60  --   --   GFRAA >60  --   --     HEMOGLOBIN A1C Lab Results  Component Value Date   HGBA1C 5.6  08/12/2019    IMPRESSION:    ICD-10-CM   1. Precordial chest pain  R07.2   2. Dyspnea on exertion  R06.09   3. Nonrheumatic mitral valve regurgitation  I34.0   4. Benign hypertension  I10   5. Encounter to discuss test results  Z71.2      RECOMMENDATIONS: Courtney Haynes is a 54 y.o. female whose past medical history and cardiac risk factors include: History of severe migraines status post PFO closure 2009 with Dr. Jacinto Halim, Hypertension, Mild MR, diastolic dysfunction.  Precordial chest pain:  Has undergone an ischemic evaluation including a stress test and echocardiogram.  Results of which were discussed to her in great detail at today's office visit.    Patient continues to have symptoms of precordial chest pain and effort related dyspnea given the low risk nuclear stress test recommended that she follow-up with pulmonary to rule out any underlying pulmonary conditions that may be contributing to her symptoms.  I would like to see her back in close follow-up in 1 month to see if the symptoms continue and if so we will consider coronary CTA for further evaluation and management.  In the meantime educated on the importance of improving her modifiable cardiovascular risk factors.  Encouraged her to consider coronary CT calcium scoring for further risk stratification also.  Independently reviewed the labs from 09/28/2020.    In the meantime if the symptoms increase in intensity, frequency, and/or duration she is asked to seek medical attention by going to the closest ER via EMS.    Dyspnea on exertion: See above  Mild mitral  regurgitation: Patient is currently asymptomatic continue to monitor.  Educated on importance of a low-salt diet and blood pressure control.  Benign essential hypertension: Office blood pressure is very well controlled.  Currently managed by PCP.  The nuclear stress test noted possible concern for chronotropic incompetence.  However, after discussing with the patient to the best of her ability she remembers holding her metoprolol for 1 day.  Recommend checking exercise treadmill stress test in couple months without beta-blocker therapy on board to reevaluate her chronotropic competence.  Patient is agreeable with the plan of care.  FINAL MEDICATION LIST END OF ENCOUNTER: No orders of the defined types were placed in this encounter.   There are no discontinued medications.   Current Outpatient Medications:  .  carisoprodol (SOMA) 350 MG tablet, Take 1 tablet by mouth 4 (four) times daily as needed., Disp: , Rfl:  .  cetirizine (ZYRTEC) 10 MG tablet, Take 10 mg by mouth daily., Disp: , Rfl:  .  fluticasone (FLONASE) 50 MCG/ACT nasal spray, Place into both nostrils daily., Disp: , Rfl:  .  furosemide (LASIX) 20 MG tablet, TAKE 1 TABLET(20 MG) BY MOUTH DAILY, Disp: 90 tablet, Rfl: 1 .  losartan (COZAAR) 50 MG tablet, Take 1 tablet (50 mg total) by mouth daily., Disp: 30 tablet, Rfl: 1 .  Melatonin 5 MG TABS, Take 10 mg by mouth daily as needed. , Disp: , Rfl:  .  metoprolol succinate (TOPROL-XL) 100 MG 24 hr tablet, TAKE 1 TABLET BY MOUTH DAILY WITH A MEAL OR IMMEDIATELY AFTER A MEAL., Disp: 60 tablet, Rfl: 0 .  naratriptan (AMERGE) 2.5 MG tablet, Take 2.5 mg by mouth as needed for migraine. Take one (1) tablet at onset of headache; if returns or does not resolve, may repeat after 4 hours; do not exceed five (5) mg in 24 hours. , Disp: , Rfl:  .  pantoprazole (  PROTONIX) 40 MG tablet, Take 1 tablet (40 mg total) by mouth daily., Disp: 30 tablet, Rfl: 6 .  potassium chloride SA (KLOR-CON) 20 MEQ  tablet, Take 1 tablet (20 mEq total) by mouth daily., Disp: 30 tablet, Rfl: 5 .  zolpidem (AMBIEN CR) 12.5 MG CR tablet, Take 12.5 mg by mouth at bedtime as needed for sleep. , Disp: , Rfl:  .  magnesium gluconate (MAGONATE) 500 MG tablet, Take 500 mg by mouth 2 (two) times daily., Disp: , Rfl:   No orders of the defined types were placed in this encounter.   There are no Patient Instructions on file for this visit.   --Continue cardiac medications as reconciled in final medication list. --Return in about 4 weeks (around 11/22/2020) for Reevaluation of, Dyspnea, Chest pain. Or sooner if needed. --Continue follow-up with your primary care physician regarding the management of your other chronic comorbid conditions.  Patient's questions and concerns were addressed to her satisfaction. She voices understanding of the instructions provided during this encounter.   This note was created using a voice recognition software as a result there may be grammatical errors inadvertently enclosed that do not reflect the nature of this encounter. Every attempt is made to correct such errors.  Tessa LernerSunit Raffaella Edison, [place]DO, [facility] Communities HospitalFACC  Pager: [phone] Office: [phone]

## 2020-11-15 ENCOUNTER — Other Ambulatory Visit: Payer: Self-pay | Admitting: Family Medicine

## 2020-11-27 ENCOUNTER — Ambulatory Visit: Payer: Medicare HMO | Admitting: Cardiology

## 2020-12-17 ENCOUNTER — Other Ambulatory Visit: Payer: Self-pay | Admitting: Family Medicine

## 2020-12-25 ENCOUNTER — Telehealth: Payer: Self-pay

## 2020-12-25 NOTE — Telephone Encounter (Signed)
Left message for patient letting her know that I was calling to see how she was doing with any COVID sx.

## 2020-12-25 NOTE — Telephone Encounter (Signed)
[facility] Day - Client TELEPHONE ADVICE RECORD [employer] Patient Name: Courtney Haynes Gender: Female DOB: May 30, 1966 Age: 55 Y 8 M 11 D Return Phone Number: [phone] (Primary), [phone] (Secondary), [phone] (Alternate) Address: [place] [address] Client [facility] Day - Client Client Site [facility] [place] - Day Physician Santiago Bumpers - MD Contact Type Call Who Is Calling Patient / Member / Family / Caregiver Call Type Triage / Clinical Caller Name ginger Relationship To Patient Sibling Return Phone Number [phone] (Alternate) Chief Complaint Fever (non urgent symptom) (> THREE MONTHS) Reason for Call Symptomatic / Request for Health Information Initial Comment Caller states her sister son was dxed with covid last thursday. now sister has fever of 101. can something be called in. Translation No Nurse Assessment Nurse: Suezanne Jacquet, RN, Riley Lam Date/Time (Eastern Time): 12/24/2020 2:55:45 PM Confirm and document reason for call. If symptomatic, describe symptoms. ---Caller states exposed to covid last week and now fever of 101 and headache started yesterday. Does the patient have any new or worsening symptoms? ---Yes Will a triage be completed? ---Yes Related visit to physician within the last 2 weeks? ---No Does the PT have any chronic conditions? (i.e. diabetes, asthma, this includes High risk factors for pregnancy, etc.) ---Yes List chronic conditions. ---cad, closure device in the heart. Is the patient pregnant or possibly pregnant? (Ask all females between the ages of 39-55) ---No Is this a behavioral health or substance abuse call? ---No Guidelines Guideline Title Affirmed Question Affirmed Notes Nurse Date/Time (Eastern Time) COVID-19 - Diagnosed or Suspected Patient sounds very sick or weak to the triager Suezanne Jacquet, RN, Riley Lam 12/24/2020 2:57:47 PM Disp. Time Lamount Cohen Time)  Disposition Final User 12/24/2020 3:06:19 PM Go to ED Now (or PCP triage) Yes Suezanne Jacquet, RN, Riley Lam PLEASE NOTE: All timestamps contained within this report are represented as [place] Standard Time. CONFIDENTIALTY NOTICE: This fax transmission is intended only for the addressee. It contains information that is legally privileged, confidential or otherwise protected from use or disclosure. If you are not the intended recipient, you are strictly prohibited from reviewing, disclosing, copying using or disseminating any of this information or taking any action in reliance on or regarding this information. If you have received this fax in error, please notify us immediately by telephone so that we can arrange for its return to [place]. Phone: [phone], Toll-Free: [phone], Fax: [phone] Page: 2 of 2 Call Id: [id number] Caller Disagree/Comply Comply Caller Understands Yes PreDisposition Did not know what to do Care Advice Given Per Guideline GO TO ED NOW (OR PCP TRIAGE): CARE ADVICE given per COVID-19 - DIAGNOSED OR SUSPECTED (Adult) guideline. Comments User: Cameron Proud, RN Date/Time Lamount Cohen Time): 12/24/2020 3:00:12 PM rates headache 8-9 and has hx of migraines. Tylenol isn't really helping. Referrals GO TO FACILITY UNDECIDED

## 2020-12-26 NOTE — Telephone Encounter (Signed)
Unable to LVM, VM full.

## 2021-01-02 ENCOUNTER — Ambulatory Visit: Payer: Medicare HMO | Admitting: Cardiology

## 2021-01-11 ENCOUNTER — Other Ambulatory Visit: Payer: Self-pay

## 2021-01-11 MED ORDER — PANTOPRAZOLE SODIUM 40 MG PO TBEC
40.0000 mg | DELAYED_RELEASE_TABLET | Freq: Every day | ORAL | 1 refills | Status: DC
Start: 2021-01-11 — End: 2021-07-15

## 2021-01-15 ENCOUNTER — Other Ambulatory Visit: Payer: Self-pay | Admitting: Family Medicine

## 2021-01-26 ENCOUNTER — Other Ambulatory Visit: Payer: Self-pay | Admitting: Family Medicine

## 2021-02-08 ENCOUNTER — Ambulatory Visit: Payer: Medicare HMO | Admitting: Cardiology

## 2021-02-08 DIAGNOSIS — M797 Fibromyalgia: Secondary | ICD-10-CM | POA: Diagnosis not present

## 2021-02-08 DIAGNOSIS — G43719 Chronic migraine without aura, intractable, without status migrainosus: Secondary | ICD-10-CM | POA: Diagnosis not present

## 2021-02-11 ENCOUNTER — Other Ambulatory Visit: Payer: Self-pay | Admitting: Family Medicine

## 2021-02-11 NOTE — Telephone Encounter (Signed)
Patient refill request.  Patient scheduled appt with Dr. Milinda Cave on 02/22/21.  metoprolol succinate (TOPROL-XL) 100 MG 24 hr tablet [[id number]]    [id number] - [place], [place] - [address] AT [facility]

## 2021-02-12 ENCOUNTER — Ambulatory Visit: Payer: Medicare HMO | Admitting: Cardiology

## 2021-02-22 ENCOUNTER — Other Ambulatory Visit: Payer: Self-pay

## 2021-02-22 ENCOUNTER — Ambulatory Visit (INDEPENDENT_AMBULATORY_CARE_PROVIDER_SITE_OTHER): Payer: Medicare HMO | Admitting: Family Medicine

## 2021-02-22 ENCOUNTER — Encounter: Payer: Self-pay | Admitting: Family Medicine

## 2021-02-22 VITALS — BP 128/85 | HR 68 | Temp 97.6°F | Resp 16 | Ht 65.0 in | Wt 163.0 lb

## 2021-02-22 DIAGNOSIS — I10 Essential (primary) hypertension: Secondary | ICD-10-CM | POA: Diagnosis not present

## 2021-02-22 DIAGNOSIS — R7303 Prediabetes: Secondary | ICD-10-CM

## 2021-02-22 DIAGNOSIS — I5032 Chronic diastolic (congestive) heart failure: Secondary | ICD-10-CM | POA: Diagnosis not present

## 2021-02-22 DIAGNOSIS — I130 Hypertensive heart and chronic kidney disease with heart failure and stage 1 through stage 4 chronic kidney disease, or unspecified chronic kidney disease: Secondary | ICD-10-CM | POA: Diagnosis not present

## 2021-02-22 DIAGNOSIS — N183 Chronic kidney disease, stage 3 unspecified: Secondary | ICD-10-CM | POA: Diagnosis not present

## 2021-02-22 DIAGNOSIS — R600 Localized edema: Secondary | ICD-10-CM

## 2021-02-22 LAB — BASIC METABOLIC PANEL
BUN: 17 mg/dL (ref 6–23)
CO2: 28 mEq/L (ref 19–32)
Calcium: 9.4 mg/dL (ref 8.4–10.5)
Chloride: 103 mEq/L (ref 96–112)
Creatinine, Ser: 0.91 mg/dL (ref 0.40–1.20)
GFR: 72.35 mL/min (ref >60.00)
Glucose, Bld: 100 mg/dL — ABNORMAL HIGH (ref 70–99)
Potassium: 4.5 mEq/L (ref 3.5–5.1)
Sodium: 139 mEq/L (ref 135–145)

## 2021-02-22 LAB — HEMOGLOBIN A1C: Hgb A1c MFr Bld: 5.7 % (ref 4.6–6.5)

## 2021-02-22 NOTE — Progress Notes (Signed)
OFFICE VISIT  02/22/2021  CC:  Chief Complaint  Patient presents with  . Follow-up    RCI, pt is fasting   HPI:    Patient is a 55 y.o. Caucasian female who presents for 5 mo f/u HTN, CRI III, and diastolic HF. A/P as of last visit: "1) Diastolic dysfunction, grd II: suspect this is main reason for her LE edema. Discussed need for low Na intake and good bp control. Also discussed use of lasix, which I want her to take (20mg  qd) on a PRN basis only, if possible. Refer to cardiologist->Dr. did her PFO closure about 10 yrs ago and we'll get her back with him.  2) HTN, not well controlled (130s/90 avg):  HR too low today to push toprol xl dosing (although pt states her HR is typically in the 80s-90s). Will cautiously add ARB in the context of CRI II (plus recently starting lasix). Losartan 50mg  qd. BMET 1 week. F/u to review home bp monitoring in 1 mo.  3) Mild/mod MVR: on recent echo. Ref to cardiologist as per #1 above.  4) Overweight: nutritionist referral today per pt request (HTN, DD/peripheral edema, IFG)."  INTERIM HX: Says home bp monitoring consistently normal. Not particularly working on diet/exercise.   Takes 400 mg ibup 2-3 times per week.  Not focused on water intake lately.  Avg lasix use is 20mg  q5-7d, says LE swelling has not been much of an issue lately. Family illnesses lately, dad in hosp, etc.  Feels like she's doing better from breathing standpoint---goes up and down stairs ok, nothing more vigorous.  No CP. Cardiology continues to follow her for her dyspnea and precordial chest pain, ischemia w/u neg.  They recommended possibly seeing pulm MD (or get "pulmonary test" per pt report today) to r/o primary pulm cause for her dyspnea. Cards also plans on checking her lipids.  ROS as above, plus--> no fevers, no CP, no SOB, no wheezing, no cough, no dizziness, no HAs, no rashes, no melena/hematochezia.  No polyuria or polydipsia.  No myalgias or  arthralgias.  No focal weakness, paresthesias, or tremors.  No acute vision or hearing abnormalities.  No dysuria or unusual/new urinary urgency or frequency.  No recent changes in lower legs. No n/v/d or abd pain.  No palpitations.    Past Medical History:  Diagnosis Date  . Allergic rhinitis   . Cervical spondylosis 08/2020   C8 radiculopathy->Surgery 08/2020 (Dr. )  . Chronic renal insufficiency, stage 3 (moderate) (HCC)    GFR 50s  . COVID-19    12/2020  . Diastolic dysfunction 09/2020 echo   grade II  . Fibromyalgia   . Heart defect    PFO--closure procedure approx 2009 (Dr. 01/2021)  . Hypertension   . IFG (impaired fasting glucose) 08/2019   Gluc 106->A1c 5.6%  . Insomnia   . Migraine syndrome   . Mild mitral valve regurgitation 09/17/2020   09/2020 echo-->mild/mod MVR  . Scoliosis     Past Surgical History:  Procedure Laterality Date  . APPENDECTOMY  6th grade  . CARDIAC SURGERY     PFO closure 2009  . CARDIOVASCULAR STRESS TEST  10/10/2020   chronotropic incompetence.  No ischemia, normal LV fxn, normal wall motion, EF 70%.  . CERVICAL SPINE SURGERY  remote past; also 08/2020   discectomy x 1, then discectomy with titanium fusion. 08/2020 C spine surg again (Dr. 13/02/2020)  . CHOLECYSTECTOMY  prior to 2010  . COLONOSCOPY     "1990s" --reason  unknown  . FOOT SURGERY  approx 1990   benign mass removed from bottom of foot  . PFTs  01/13/2019   Minimal obstructive airway dz; no response to bronchodilator  . TRANSTHORACIC ECHOCARDIOGRAM  09/17/2020   EF 60-65%, grd II DD, mild/mod MVR    Outpatient Medications Prior to Visit  Medication Sig Dispense Refill  . carisoprodol (SOMA) 350 MG tablet Take 1 tablet by mouth 4 (four) times daily as needed.    . cetirizine (ZYRTEC) 10 MG tablet Take 10 mg by mouth daily.    . fluticasone (FLONASE) 50 MCG/ACT nasal spray Place into both nostrils daily.    . furosemide (LASIX) 20 MG tablet TAKE 1 TABLET(20 MG) BY MOUTH DAILY 90  tablet 1  . losartan (COZAAR) 50 MG tablet TAKE 1 TABLET(50 MG) BY MOUTH DAILY 90 tablet 1  . magnesium gluconate (MAGONATE) 500 MG tablet Take 500 mg by mouth 2 (two) times daily.    . Melatonin 5 MG TABS Take 10 mg by mouth daily as needed.     . metoprolol succinate (TOPROL-XL) 100 MG 24 hr tablet TAKE 1 TABLET BY MOUTH DAILY IMMEDIATELY AFTER A MEAL 30 tablet 0  . Milnacipran HCl (SAVELLA PO) Take by mouth 2 (two) times daily.    . naratriptan (AMERGE) 2.5 MG tablet Take 2.5 mg by mouth as needed for migraine. Take one (1) tablet at onset of headache; if returns or does not resolve, may repeat after 4 hours; do not exceed five (5) mg in 24 hours.     . pantoprazole (PROTONIX) 40 MG tablet Take 1 tablet (40 mg total) by mouth daily. 90 tablet 1  . potassium chloride SA (KLOR-CON) 20 MEQ tablet Take 1 tablet (20 mEq total) by mouth daily. 30 tablet 5  . zolpidem (AMBIEN CR) 12.5 MG CR tablet Take 12.5 mg by mouth at bedtime as needed for sleep.     No facility-administered medications prior to visit.    No Known Allergies  ROS As per HPI  PE: Vitals with BMI 02/22/2021 10/25/2020 09/27/2020  Height 5\' 5"  5\' 5"  5\' 5"   Weight 163 lbs 163 lbs 166 lbs  BMI 27.12 27.12 27.62  Systolic 128 112  Diastolic 85 77 83  Pulse 68 84 80     Gen: Alert, well appearing.  Patient is oriented to person, place, time, and situation. AFFECT: pleasant, lucid thought and speech. CV: RRR, no m/r/g.   LUNGS: CTA bilat, nonlabored resps, good aeration in all lung fields. EXT: no clubbing or cyanosis.  no edema.    LABS:    Chemistry      Component Value Date/Time   NA 138 09/25/2020 1059   K 3.8 09/25/2020 1059   CL 100 09/25/2020 1059   CO2 31 09/25/2020 1059   BUN 21 09/25/2020 1059   CREATININE 0.95 09/25/2020 1059      Component Value Date/Time   CALCIUM 8.9 09/25/2020 1059   ALKPHOS 103 09/25/2020 1059   AST 13 09/25/2020 1059   ALT 13 09/25/2020 1059   BILITOT 0.3 09/25/2020  1059     Lab Results  Component Value Date   WBC 7.0 09/25/2020   HGB 13.7 09/25/2020   HCT 40.2 09/25/2020   MCV 92.8 09/25/2020   PLT 306.0 09/25/2020   Lab Results  Component Value Date   CHOL 192 09/28/2020   HDL 40 09/28/2020   LDLCALC 116 (H) 09/28/2020   LDLDIRECT 117 (H) 09/28/2020   TRIG 204 (  H) 09/28/2020   CHOLHDL 4 08/11/2019   Lab Results  Component Value Date   HGBA1C 5.6 08/12/2019   Lab Results  Component Value Date   TSH 1.79 09/25/2020   IMPRESSION AND PLAN:  1) HTN: well controlled. Lytes/cr today. Told pt to watch intake of ibup and see if bp or swelling went up after taking doses of this med.  2) LE swelling: minimal problem of late. Has lasix 20mg  that she occ takes. Cont good bp control, low Na intake, avoid NSAIDs if possible. Told pt to watch intake of ibup and see if bp or swelling went up after taking doses of this med.  3) CRI III: takes NSAIDs on infrequent basis. Needs to focus on more water intake. Lytes/cr today.  4) Prediabetes, overweight (BMI 27). +FH DM (father). Encouraged her to work on lower carb intake, inc exercise. Hba1c today.  5) Dyspnea: cardiac etiology ruled out. Cards working with her to either get PFTs or get her to pulm for r/o primary pulm etiology. Seems like she's better on this issue lately, perhaps b/c distracted more by external/social/life stressors lately.  An After Visit Summary was printed and given to the patient.  FOLLOW UP: Return in about 6 months (around 08/25/2021) for annual CPE (fasting).  Signed:  08/27/2021, MD           02/22/2021

## 2021-03-10 ENCOUNTER — Other Ambulatory Visit: Payer: Self-pay | Admitting: Family Medicine

## 2021-03-19 ENCOUNTER — Emergency Department
Admission: EM | Admit: 2021-03-19 | Discharge: 2021-03-19 | Disposition: A | Discharge status: Home / Self Care | Payer: Medicare HMO | Source: Home / Self Care | Attending: Family Medicine | Admitting: Family Medicine

## 2021-03-19 ENCOUNTER — Emergency Department (INDEPENDENT_AMBULATORY_CARE_PROVIDER_SITE_OTHER): Payer: Medicare HMO

## 2021-03-19 ENCOUNTER — Encounter: Payer: Self-pay | Admitting: Emergency Medicine

## 2021-03-19 ENCOUNTER — Telehealth: Payer: Self-pay

## 2021-03-19 ENCOUNTER — Other Ambulatory Visit: Payer: Self-pay

## 2021-03-19 DIAGNOSIS — M722 Plantar fascial fibromatosis: Secondary | ICD-10-CM

## 2021-03-19 DIAGNOSIS — M79671 Pain in right foot: Secondary | ICD-10-CM

## 2021-03-19 MED ORDER — PREDNISONE 20 MG PO TABS: ORAL_TABLET | ORAL | 0 refills | Status: DC

## 2021-03-19 NOTE — ED Triage Notes (Signed)
Patient c/o right heel pain x 7 days, no apparent injury.  Patient has been taking Tylenol without relief.

## 2021-03-19 NOTE — Telephone Encounter (Signed)
Patient evaluated/treated at Urgent Care today (03/19/2021).   [facility] Day - Client TELEPHONE ADVICE RECORD [facility] Patient Name: Courtney Haynes Gender: Female DOB: 07/11/1966 Age: 55 Y 11 M 6 D Return Phone [phone](Primary), [phone](Secondary) Address: [place]  [address] Client [facility] Day - Client Client Site [facility] - Day Contact Type Call Who Is Calling Patient / Member / Family / Caregiver Call Type Triage / Clinical Relationship To Patient Self Return Phone Number [phone] (Primary) Chief Complaint Walking difficulty Reason for Call Symptomatic / Request for Health Information Initial Comment Caller states that her right foot is swelling and she has a knot on it. Her heel hurts and it's hard to walk. Translation No Nurse Assessment Nurse: Lily Kocher, RN, Adriana Date/Time (Eastern Time): 03/19/2021 8:45:31 AM Confirm and document reason for call. If symptomatic, describe symptoms. ---pt states that she has been experiencing pain to the heel. also swelling to foot. has a small knot to front of leg above ankle. pain of 6-7/10 when resting, with ambulation severe. Does the patient have any new or worsening symptoms? ---Yes Will a triage be completed? ---Yes Related visit to physician within the last 2 weeks? ---No Does the PT have any chronic conditions? (i.e. diabetes, asthma, this includes High risk factors for pregnancy, etc.) ---Yes List chronic conditions. ---chf renal failure III pre diabetes htn Is the patient pregnant or possibly pregnant? (Ask all females between the ages of 60-55) ---No Is this a behavioral health or substance abuse call? ---No Guidelines Guideline Title Affirmed Question Affirmed Notes Nurse Date/Time Lamount Cohen Time) Foot Pain [1] SEVERE pain (e.g., excruciating, unable to do any normal activities) AND [2] not improved after 2  hours of pain medicine Lily Kocher, RN, Adriana 03/19/2021 8:52:05 AM PLEASE NOTE: All timestamps contained within this report are represented as [place] Standard Time. CONFIDENTIALTY NOTICE: This fax transmission is intended only for the addressee. It contains information that is legally privileged, confidential or otherwise protected from use or disclosure. If you are not the intended recipient, you are strictly prohibited from reviewing, disclosing, copying using or disseminating any of this information or taking any action in reliance on or regarding this information. If you have received this fax in error, please notify us immediately by telephone so that we can arrange for its return to [place]. Phone: [phone], Toll-Free: [phone], Fax: [phone] Page: 2 of 2 Call Id: [id number] Disp. Time Lamount Cohen Time) Disposition Final User 03/19/2021 9:00:26 AM See HCP within 4 Hours (or PCP triage) Yes Lily Kocher, RN, Ricki Rodriguez Caller Disagree/Comply Comply Caller Understands Yes PreDisposition Call Doctor Care Advice Given Per Guideline SEE HCP (OR PCP TRIAGE) WITHIN 4 HOURS: * IF OFFICE WILL BE OPEN: You need to be seen within the next 3 or 4 hours. Call your doctor (or NP/PA) now or as soon as the office opens. PAIN MEDICINES: * ACETAMINOPHEN - REGULAR STRENGTH TYLENOL: Take 650 mg (two 325 mg pills) by mouth every 4 to 6 hours as needed. Each Regular Strength Tylenol pill has 325 mg of acetaminophen. The most you should take each day is 3,250 mg (10 pills a day). * IBUPROFEN (E.G., MOTRIN, ADVIL): Take 400 mg (two 200 mg pills) by mouth every 6 hours. The most you should take each day is 1,200 mg (six 200 mg pills), unless your doctor has told you to take more. CALL BACK IF: * You become worse CARE ADVICE given per Foot Pain (Adult) guideline. Comments  User: Jerilynn Birkenhead, RN Date/Time Lamount Cohen Time): 03/19/2021 8:58:46 AM no answer from backline, primary number routes to [facility] PC User:  Jerilynn Birkenhead, RN Date/Time Lamount Cohen Time): 03/19/2021 9:02:16 AM advised to seek care, try office again in a while, if no answer, go to UC Referrals REFERRED TO PCP OFFICE

## 2021-03-19 NOTE — Discharge Instructions (Addendum)
Apply ice pack for 20 to 30 minutes, 3 to 4 times daily  Continue until pain and swelling decrease.  May take Tylenol if needed for pain.  Wear heel splint/pad during the day.  Begin stretching exercises. Try using a night splint.

## 2021-03-19 NOTE — ED Provider Notes (Signed)
[facility]    CSN: [id number] Arrival date & time: 03/19/21  1117      History   Chief Complaint Chief Complaint  Patient presents with  . Foot Pain    HPI Courtney Haynes is a 55 y.o. female.   Patient complains of pain in the plantar surface of her right heel for one week, worse when standing and walking.  She recalls no injury, and denies recent change in activities or shoes.  She has had no improvement with Tylenol and ibuprofen 400mg   The history is provided by the patient.  Foot Pain This is a new problem. The current episode started more than 1 week ago. The problem occurs constantly. The problem has not changed since onset.The symptoms are aggravated by walking and standing. Nothing relieves the symptoms. She has tried acetaminophen (ibuprofen) for the symptoms. The treatment provided no relief.    Past Medical History:  Diagnosis Date  . Allergic rhinitis   . Cervical spondylosis 08/2020   C8 radiculopathy->Surgery 08/2020 (Dr. 09/2020)  . Chronic renal insufficiency, stage 3 (moderate) (HCC)    GFR 50s  . COVID-19    12/2020  . Diastolic dysfunction 09/2020 echo   grade II  . Fibromyalgia   . Heart defect    PFO--closure procedure approx 2009 (Dr. 2010)  . Hypertension   . IFG (impaired fasting glucose) 08/2019   Gluc 106->A1c 5.6%  . Insomnia   . Migraine syndrome   . Mild mitral valve regurgitation 09/17/2020   09/2020 echo-->mild/mod MVR  . Scoliosis     Patient Active Problem List   Diagnosis Date Noted  . Radiculopathy, cervicothoracic region 07/31/2020  . Fibromyalgia 03/25/2012  . Essential hypertension 01/30/2009  . Migraine, unspecified, not intractable, without status migrainosus 01/30/2009  . Myalgia and myositis, unspecified 01/30/2009  . Neuromyositis 01/30/2009  . Thrombophlebitis of superficial veins of lower extremity 01/30/2009    Past Surgical History:  Procedure Laterality Date  . APPENDECTOMY  6th grade  . CARDIAC  SURGERY     PFO closure 2009  . CARDIOVASCULAR STRESS TEST  10/10/2020   chronotropic incompetence.  No ischemia, normal LV fxn, normal wall motion, EF 70%.  . CERVICAL SPINE SURGERY  remote past; also 08/2020   discectomy x 1, then discectomy with titanium fusion. 08/2020 C spine surg again (Dr. 09/2020)  . CHOLECYSTECTOMY  prior to 2010  . COLONOSCOPY     "1990s" --reason unknown  . FOOT SURGERY  approx 1990   benign mass removed from bottom of foot  . PFTs  01/13/2019   Minimal obstructive airway dz; no response to bronchodilator  . TRANSTHORACIC ECHOCARDIOGRAM  09/17/2020   EF 60-65%, grd II DD, mild/mod MVR    OB History   No obstetric history on file.      Home Medications    Prior to Admission medications   Medication Sig Start Date End Date Taking? Authorizing Provider  carisoprodol (SOMA) 350 MG tablet Take 1 tablet by mouth 4 (four) times daily as needed. 09/22/20  Yes [provider]  cetirizine (ZYRTEC) 10 MG tablet Take 10 mg by mouth daily.   Yes [provider]  fluticasone (FLONASE) 50 MCG/ACT nasal spray Place into both nostrils daily.   Yes [provider]  furosemide (LASIX) 20 MG tablet TAKE 1 TABLET(20 MG) BY MOUTH DAILY 10/15/20  Yes McGowen, 13/8/21, MD  losartan (COZAAR) 50 MG tablet TAKE 1 TABLET(50 MG) BY MOUTH DAILY 11/15/20  Yes McGowen, 14/9/21  H, MD  magnesium gluconate (MAGONATE) 500 MG tablet Take 500 mg by mouth 2 (two) times daily.   Yes [provider]  Melatonin 5 MG TABS Take 10 mg by mouth daily as needed.    Yes [provider]  metoprolol succinate (TOPROL-XL) 100 MG 24 hr tablet TAKE 1 TABLET BY MOUTH DAILY IMMEDIATELY AFTER A MEAL 03/11/21  Yes McGowen, Maryjean Morn, MD  Milnacipran HCl (SAVELLA PO) Take by mouth 2 (two) times daily.   Yes [provider]  naratriptan (AMERGE) 2.5 MG tablet Take 2.5 mg by mouth as needed for migraine. Take one (1) tablet at onset of headache; if returns or does not  resolve, may repeat after 4 hours; do not exceed five (5) mg in 24 hours.    Yes [provider]  pantoprazole (PROTONIX) 40 MG tablet Take 1 tablet (40 mg total) by mouth daily. 01/11/21  Yes McGowen, Maryjean Morn, MD  potassium chloride SA (KLOR-CON) 20 MEQ tablet Take 1 tablet (20 mEq total) by mouth daily. 09/26/20  Yes McGowen, Maryjean Morn, MD  predniSONE (DELTASONE) 20 MG tablet Take one tab by mouth twice daily for 4 days, then one daily for 3 days. Take with food. 03/19/21  Yes Lattie Haw, MD  zolpidem (AMBIEN CR) 12.5 MG CR tablet Take 12.5 mg by mouth at bedtime as needed for sleep.   Yes [provider]    Family History Family History  Problem Relation Age of Onset  . Heart disease Father   . Diabetes Father   . Kidney disease Father   . Stroke Father   . Heart disease Sister     Social History Social History   Tobacco Use  . Smoking status: Never Smoker  . Smokeless tobacco: Never Used  Substance Use Topics  . Alcohol use: No  . Drug use: No     Allergies   Patient has no known allergies.   Review of Systems Review of Systems  Constitutional: Negative for chills, diaphoresis, fatigue and fever.  Musculoskeletal:       Right heel pain  Skin: Negative for rash and wound.  All other systems reviewed and are negative.    Physical Exam Triage Vital Signs ED Triage Vitals [03/19/21 1138]  Enc Vitals Group     BP 131/87     Pulse Rate 86     Resp      Temp      Temp src      SpO2 96 %     Weight      Height      Head Circumference      Peak Flow      Pain Score 6     Pain Loc      Pain Edu?      Excl. in GC?    No data found.  Updated Vital Signs BP 131/87 (BP Location: Left Arm)   Pulse 86   SpO2 96%   Visual Acuity Right Eye Distance:   Left Eye Distance:   Bilateral Distance:    Right Eye Near:   Left Eye Near:    Bilateral Near:     Physical Exam Vitals and nursing note reviewed.  Constitutional:      General:  She is not in acute distress. HENT:     Head: Normocephalic.  Eyes:     Pupils: Pupils are equal, round, and reactive to light.  Cardiovascular:     Rate and Rhythm: Normal rate.  Pulmonary:     Effort: Pulmonary effort is normal.  Musculoskeletal:     Right foot: Normal range of motion. Tenderness present. No swelling, deformity, bony tenderness or crepitus. Normal pulse.       Feet:     Comments: Right heel plantar surface has tenderness to palpation with deep palpation.  There is no erythema or warmth.  Skin:    General: Skin is warm and dry.     Capillary Refill: Capillary refill takes less than 2 seconds.     Findings: No rash.  Neurological:     Mental Status: She is alert and oriented to person, place, and time.      UC Treatments / Results  Labs (all labs ordered are listed, but only abnormal results are displayed) Labs Reviewed - No data to display  EKG   Radiology DG Os Calcis Right  Result Date: 03/19/2021 CLINICAL DATA:  Right heel pain. EXAM: RIGHT OS CALCIS - 2+ VIEW COMPARISON:  None. FINDINGS: There is no evidence of fracture or other focal bone lesions. Soft tissues are unremarkable. IMPRESSION: Negative. Electronically Signed   By: Charlett Nose M.D.   On: 03/19/2021 12:30    Procedures Procedures (including critical care time)  Medications Ordered in UC Medications - No data to display  Initial Impression / Assessment and Plan / UC Course  I have reviewed the triage vital signs and the nursing notes.  Pertinent labs & imaging results that were available during my care of the patient were reviewed by me and considered in my medical decision making (see chart for details).    Dispensed AirCast "Airheel".  Begin prednisone burst/taper. Followup with Dr. Rodney [facility] ([facility]) if not improving about three weeks.   Final Clinical Impressions(s) / UC Diagnoses   Final diagnoses:  Plantar fasciitis of right foot      Discharge Instructions     Apply ice pack for 20 to 30 minutes, 3 to 4 times daily  Continue until pain and swelling decrease.  May take Tylenol if needed for pain.  Wear heel splint/pad during the day.  Begin stretching exercises. Try using a night splint.    ED Prescriptions    Medication Sig Dispense Auth. Provider   predniSONE (DELTASONE) 20 MG tablet Take one tab by mouth twice daily for 4 days, then one daily for 3 days. Take with food. 11 tablet Lattie Haw, MD        Lattie Haw, MD 03/21/21 [id number]

## 2021-04-16 ENCOUNTER — Other Ambulatory Visit: Payer: Self-pay

## 2021-04-16 MED ORDER — FUROSEMIDE 20 MG PO TABS: ORAL_TABLET | ORAL | 1 refills | Status: DC

## 2021-05-22 ENCOUNTER — Other Ambulatory Visit: Payer: Self-pay | Admitting: Family Medicine

## 2021-06-20 ENCOUNTER — Telehealth: Payer: Self-pay | Admitting: Family Medicine

## 2021-06-20 NOTE — Telephone Encounter (Signed)
Left message for patient to schedule Annual Wellness Visit.  Please schedule with Nurse Health Advisor Julie Greer, RN at [facility].  

## 2021-06-27 ENCOUNTER — Ambulatory Visit: Payer: Medicare HMO

## 2021-07-10 ENCOUNTER — Ambulatory Visit (INDEPENDENT_AMBULATORY_CARE_PROVIDER_SITE_OTHER): Payer: Medicare HMO | Admitting: *Deleted

## 2021-07-10 DIAGNOSIS — Z1231 Encounter for screening mammogram for malignant neoplasm of breast: Secondary | ICD-10-CM

## 2021-07-10 DIAGNOSIS — Z0000 Encounter for general adult medical examination without abnormal findings: Secondary | ICD-10-CM

## 2021-07-10 DIAGNOSIS — Z1211 Encounter for screening for malignant neoplasm of colon: Secondary | ICD-10-CM | POA: Diagnosis not present

## 2021-07-10 NOTE — Progress Notes (Signed)
Subjective:   Courtney Haynes is a 55 y.o. female who presents for Medicare Annual (Subsequent) preventive examination.  I connected with  Keyle B Bouffard on 07/10/21 by a telephone enabled telemedicine application and verified that I am speaking with the correct person using two identifiers.   I discussed the limitations of evaluation and management by telemedicine. The patient expressed understanding and agreed to proceed.   Review of Systems    na Cardiac Risk Factors include: advanced age (>71men, >64 women)     Objective:    Today's Vitals   07/10/21 1549 07/10/21 1552  PainSc: 3  3    There is no height or weight on file to calculate BMI.  Advanced Directives 07/10/2021 05/28/2020 11/11/2018  Does Patient Have a Medical Advance Directive? No No No  Would patient like information on creating a medical advance directive? No - Patient declined - No - Patient declined    Current Medications (verified) Outpatient Encounter Medications as of 07/10/2021  Medication Sig   carisoprodol (SOMA) 350 MG tablet Take 1 tablet by mouth 4 (four) times daily as needed.   cetirizine (ZYRTEC) 10 MG tablet Take 10 mg by mouth daily.   furosemide (LASIX) 20 MG tablet TAKE 1 TABLET(20 MG) BY MOUTH DAILY   losartan (COZAAR) 50 MG tablet TAKE 1 TABLET(50 MG) BY MOUTH DAILY   magnesium gluconate (MAGONATE) 500 MG tablet Take 500 mg by mouth 2 (two) times daily.   Melatonin 5 MG TABS Take 10 mg by mouth daily as needed.    metoprolol succinate (TOPROL-XL) 100 MG 24 hr tablet TAKE 1 TABLET BY MOUTH DAILY IMMEDIATELY AFTER A MEAL   Milnacipran HCl (SAVELLA PO) Take by mouth 2 (two) times daily.   naratriptan (AMERGE) 2.5 MG tablet Take 2.5 mg by mouth as needed for migraine. Take one (1) tablet at onset of headache; if returns or does not resolve, may repeat after 4 hours; do not exceed five (5) mg in 24 hours.    pantoprazole (PROTONIX) 40 MG tablet Take 1 tablet (40 mg total) by mouth daily.   potassium  chloride SA (KLOR-CON) 20 MEQ tablet Take 1 tablet (20 mEq total) by mouth daily.   zolpidem (AMBIEN CR) 12.5 MG CR tablet Take 12.5 mg by mouth at bedtime as needed for sleep.   fluticasone (FLONASE) 50 MCG/ACT nasal spray Place into both nostrils daily.   predniSONE (DELTASONE) 20 MG tablet Take one tab by mouth twice daily for 4 days, then one daily for 3 days. Take with food.   No facility-administered encounter medications on file as of 07/10/2021.    Allergies (verified) Patient has no known allergies.   History: Past Medical History:  Diagnosis Date   Allergic rhinitis    Cervical spondylosis 08/2020   C8 radiculopathy->Surgery 08/2020 (Dr. Yetta Barre)   Chronic renal insufficiency, stage 3 (moderate) (HCC)    GFR 50s   COVID-19    12/2020   Diastolic dysfunction 09/2020 echo   grade II   Fibromyalgia    Heart defect    PFO--closure procedure approx 2009 (Dr. Jacinto Halim)   Hypertension    IFG (impaired fasting glucose) 08/2019   Gluc 106->A1c 5.6%   Insomnia    Migraine syndrome    Mild mitral valve regurgitation 09/17/2020   09/2020 echo-->mild/mod MVR   Scoliosis    Past Surgical History:  Procedure Laterality Date   APPENDECTOMY  6th grade   CARDIAC SURGERY     PFO closure 2009  CARDIOVASCULAR STRESS TEST  10/10/2020   chronotropic incompetence.  No ischemia, normal LV fxn, normal wall motion, EF 70%.   CERVICAL SPINE SURGERY  remote past; also 08/2020   discectomy x 1, then discectomy with titanium fusion. 08/2020 C spine surg again (Dr. Yetta Barre)   CHOLECYSTECTOMY  prior to 2010   COLONOSCOPY     "1990s" --reason unknown   FOOT SURGERY  approx 1990   benign mass removed from bottom of foot   PFTs  01/13/2019   Minimal obstructive airway dz; no response to bronchodilator   TRANSTHORACIC ECHOCARDIOGRAM  09/17/2020   EF 60-65%, grd II DD, mild/mod MVR   Family History  Problem Relation Age of Onset   Heart disease Father    Diabetes Father    Kidney disease Father     Stroke Father    Heart disease Sister    Social History   Socioeconomic History   Marital status: Married    Spouse name: Not on file   Number of children: Not on file   Years of education: Not on file   Highest education level: Not on file  Occupational History   Not on file  Tobacco Use   Smoking status: Never   Smokeless tobacco: Never  Substance and Sexual Activity   Alcohol use: No   Drug use: No   Sexual activity: Not on file  Other Topics Concern   Not on file  Social History Narrative   Widow, 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   Educ: "4 yr degree"   Former occup: Architect.  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     Denies tob or alc or street drug use.   Says she was once on oxycontin for fibromyalgia pain but this was in the far remote past.   Social Determinants of Health   Financial Resource Strain: Low Risk    Difficulty of Paying Living Expenses: Not very hard  Food Insecurity: No Food Insecurity   Worried About Programme researcher, broadcasting/film/video in the Last Year: Never true   Ran Out of Food in the Last Year: Never true  Transportation Needs: No Transportation Needs   Lack of Transportation (Medical): No   Lack of Transportation (Non-Medical): No  Physical Activity: Insufficiently Active   Days of Exercise per Week: 4 days   Minutes of Exercise per Session: 10 min  Stress: Stress Concern Present   Feeling of Stress : To some extent  Social Connections: Socially Isolated   Frequency of Communication with Friends and Family: Three times a week   Frequency of Social Gatherings with Friends and Family: Three times a week   Attends Religious Services: Never   Active Member of Clubs or Organizations: No   Attends Banker Meetings: Never   Marital Status: Divorced    Tobacco Counseling Counseling given: Not Answered   Clinical Intake:  Pre-visit preparation completed: Yes  Pain : 0-10 Pain Score: 3  Pain Location:  Leg Pain Descriptors / Indicators: Constant Pain Onset: 1 to 4 weeks ago Pain Frequency: Intermittent     Nutritional Risks: None Diabetes: No  How often do you need to have someone help you when you read instructions, pamphlets, or other written materials from your doctor or pharmacy?: 1 - Never  Diabetic?no  Interpreter Needed?: No  Information entered by :: Remi Haggard LPN   Activities of Daily Living In your present state of health, do you have any difficulty performing  the following activities: 07/10/2021  Hearing? N  Vision? N  Difficulty concentrating or making decisions? N  Walking or climbing stairs? N  Dressing or bathing? N  Doing errands, shopping? N  Preparing Food and eating ? N  Using the Toilet? N  In the past six months, have you accidently leaked urine? N  Do you have problems with loss of bowel control? N  Managing your Medications? N  Managing your Finances? N  Housekeeping or managing your Housekeeping? N  Some recent data might be hidden    Patient Care Team: Jeoffrey MassedMcGowen, Philip H, MD as PCP - General (Family Medicine) Fenton Mallingeynolds, Michael L, MD as Consulting Physician (Neurology) Tia AlertJones, David S, MD as Consulting Physician (Neurosurgery) Yates DecampGanji, Jay, MD as Consulting Physician (Cardiology)  Indicate any recent Medical Services you may have received from other than [facility] providers in the past year (date may be approximate).     Assessment:   This is a routine wellness examination for Eira.  Hearing/Vision screen Hearing Screening - Comments:: No trouble hearing Vision Screening - Comments:: Not up to Date No specific doctor  Dietary issues and exercise activities discussed: Current Exercise Habits: Home exercise routine, Type of exercise: stretching, Time (Minutes): 20, Frequency (Times/Week): 3, Weekly Exercise (Minutes/Week): 60, Intensity: Mild   Goals Addressed             This Visit's Progress    Patient Stated       Get blood pressure  under control Exercise a little more       Depression Screen PHQ 2/9 Scores 07/10/2021 05/31/2020 04/28/2019 12/02/2018  PHQ - 2 Score 0 0 0 0    Fall Risk Fall Risk  07/10/2021  Falls in the past year? 0  Number falls in past yr: 0  Injury with Fall? 0  Follow up Falls evaluation completed;Falls prevention discussed    FALL RISK PREVENTION PERTAINING TO THE HOME:  Any stairs in or around the home? Yes  If so, are there any without handrails? Yes  Home free of loose throw rugs in walkways, pet beds, electrical cords, etc? Yes  Adequate lighting in your home to reduce risk of falls? Yes   ASSISTIVE DEVICES UTILIZED TO PREVENT FALLS:  Life alert? No  Use of a cane, walker or w/c? No  Grab bars in the bathroom? No  Shower chair or bench in shower? Yes  Elevated toilet seat or a handicapped toilet? No   TIMED UP AND GO:  Was the test performed? No .    Cognitive Function:  Normal cognitive status assessed by direct observation by this Nurse Health Advisor. No abnormalities found.        Immunizations Immunization History  Administered Date(s) Administered   Influenza,inj,Quad PF,6+ Mos 08/11/2019    TDAP status: Due, Education has been provided regarding the importance of this vaccine. Advised may receive this vaccine at local pharmacy or Health Dept. Aware to provide a copy of the vaccination record if obtained from local pharmacy or Health Dept. Verbalized acceptance and understanding.  Flu Vaccine status: Due, Education has been provided regarding the importance of this vaccine. Advised may receive this vaccine at local pharmacy or Health Dept. Aware to provide a copy of the vaccination record if obtained from local pharmacy or Health Dept. Verbalized acceptance and understanding.  Covid-19 vaccine status: Declined, Education has been provided regarding the importance of this vaccine but patient still declined. Advised may receive this vaccine at local pharmacy or  Health Dept.or vaccine  clinic. Aware to provide a copy of the vaccination record if obtained from local pharmacy or Health Dept. Verbalized acceptance and understanding.  Qualifies for Shingles Vaccine? Yes   Zostavax completed Yes   Shingrix Completed?: Yes  Screening Tests Health Maintenance  Topic Date Due   COVID-19 Vaccine (1) Never done   TETANUS/TDAP  Never done   PAP SMEAR-Modifier  Never done   COLONOSCOPY (Pts 45-43yrs Insurance coverage will need to be confirmed)  Never done   Zoster Vaccines- Shingrix (1 of 2) Never done   INFLUENZA VACCINE  07/08/2021   MAMMOGRAM  02/22/2022 (Originally 04/12/2018)   Hepatitis C Screening  02/22/2022 (Originally 04/12/1986)   HIV Screening  02/22/2022 (Originally 04/13/1983)   Pneumococcal Vaccine 55-55 Years old  Aged Out   HPV VACCINES  Aged Out    Health Maintenance  Health Maintenance Due  Topic Date Due   COVID-19 Vaccine (1) Never done   TETANUS/TDAP  Never done   PAP SMEAR-Modifier  Never done   COLONOSCOPY (Pts 45-25yrs Insurance coverage will need to be confirmed)  Never done   Zoster Vaccines- Shingrix (1 of 2) Never done   INFLUENZA VACCINE  07/08/2021    Colorectal cancer screening: Referral to GI placed  . Pt aware the office will call re: appt.  Mammogram status: Ordered  . Pt provided with contact info and advised to call to schedule appt.     Lung Cancer Screening: (Low Dose CT Chest recommended if Age 73-80 years, 30 pack-year currently smoking OR have quit w/in 15years.) does not qualify.   Lung Cancer Screening Referral:   Additional Screening:  Hepatitis C Screening: does not qualify  Vision Screening: Recommended annual ophthalmology exams for early detection of glaucoma and other disorders of the eye. Is the patient up to date with their annual eye exam?  No  Who is the provider or what is the name of the office in which the patient attends annual eye exams? If pt is not established with a provider, would  they like to be referred to a provider to establish care? No .   Dental Screening: Recommended annual dental exams for proper oral hygiene  Community Resource Referral / Chronic Care Management: CRR required this visit?  No   CCM required this visit?  No      Plan:     I have personally reviewed and noted the following in the patient's chart:   Medical and social history Use of alcohol, tobacco or illicit drugs  Current medications and supplements including opioid prescriptions.  Functional ability and status Nutritional status Physical activity Advanced directives List of other physicians Hospitalizations, surgeries, and ER visits in previous 12 months Vitals Screenings to include cognitive, depression, and falls Referrals and appointments  In addition, I have reviewed and discussed with patient certain preventive protocols, quality metrics, and best practice recommendations. A written personalized care plan for preventive services as well as general preventive health recommendations were provided to patient.     Remi Haggard, LPN   01/16/7680   Nurse Notes: na

## 2021-07-10 NOTE — Patient Instructions (Signed)
Courtney Haynes , Thank you for taking time to come for your Medicare Wellness Visit. I appreciate your ongoing commitment to your health goals. Please review the following plan we discussed and let me know if I can assist you in the future.   Screening recommendations/referrals: Colonoscopy: Education provided Mammogram: Education provided Please call [name] to schedule  Recommended yearly ophthalmology/optometry visit for glaucoma screening and checkup Recommended yearly dental visit for hygiene and checkup  Vaccinations: Influenza vaccine: Education provided Tdap vaccine: Education provided Shingles vaccine: Education provided    Advanced directives: Education provided  Conditions/risks identified: na  Next appointment: 08-26-2021 @ 9:30 Dr. Milinda Cave   Preventive Care , Female Preventive care refers to lifestyle choices and visits with your health care provider that can promote health and wellness. What does preventive care include? A yearly physical exam. This is also called an annual well check. Dental exams once or twice a year. Routine eye exams. Ask your health care provider how often you should have your eyes checked. Personal lifestyle choices, including: Daily care of your teeth and gums. Regular physical activity. Eating a healthy diet. Avoiding tobacco and drug use. Limiting alcohol use. Practicing safe sex. Taking low-dose aspirin every day. Taking vitamin and mineral supplements as recommended by your health care provider. What happens during an annual well check? The services and screenings done by your health care provider during your annual well check will depend on your age, overall health, lifestyle risk factors, and family history of disease. Counseling  Your health care provider may ask you questions about your: Alcohol use. Tobacco use. Drug use. Emotional well-being. Home and relationship well-being. Sexual activity. Eating  habits. History of falls. Memory and ability to understand (cognition). Work and work Astronomer. Reproductive health. Screening  You may have the following tests or measurements: Height, weight, and BMI. Blood pressure. Lipid and cholesterol levels. These may be checked every 5 years, or more frequently if you are over 67 years old. Skin check. Lung cancer screening. You may have this screening every year starting at age 15 if you have a 30-pack-year history of smoking and currently smoke or have quit within the past 15 years. Fecal occult blood test (FOBT) of the stool. You may have this test every year starting at age 48. Flexible sigmoidoscopy or colonoscopy. You may have a sigmoidoscopy every 5 years or a colonoscopy every 10 years starting at age 27. Hepatitis C blood test. Hepatitis B blood test. Sexually transmitted disease (STD) testing. Diabetes screening. This is done by checking your blood sugar (glucose) after you have not eaten for a while (fasting). You may have this done every 1-3 years. Bone density scan. This is done to screen for osteoporosis. You may have this done starting at age 71. Mammogram. This may be done every 1-2 years. Talk to your health care provider about how often you should have regular mammograms. Talk with your health care provider about your test results, treatment options, and if necessary, the need for more tests. Vaccines  Your health care provider may recommend certain vaccines, such as: Influenza vaccine. This is recommended every year. Tetanus, diphtheria, and acellular pertussis (Tdap, Td) vaccine. You may need a Td booster every 10 years. Zoster vaccine. You may need this after age 45. Pneumococcal 13-valent conjugate (PCV13) vaccine. One dose is recommended after age 35. Pneumococcal polysaccharide (PPSV23) vaccine. One dose is recommended after age 56. Talk to your health care provider about which screenings and vaccines you need and how  often you need them. This information is not intended to replace advice given to you by your health care provider. Make sure you discuss any questions you have with your health care provider. Document Released: 12/21/2015 Document Revised: 08/13/2016 Document Reviewed: 09/25/2015 Elsevier Interactive Patient Education  2017 [employer] Prevention in the Home Falls can cause injuries. They can happen to people of all ages. There are many things you can do to make your home safe and to help prevent falls. What can I do on the outside of my home? Regularly fix the edges of walkways and driveways and fix any cracks. Remove anything that might make you trip as you walk through a door, such as a raised step or threshold. Trim any bushes or trees on the path to your home. Use bright outdoor lighting. Clear any walking paths of anything that might make someone trip, such as rocks or tools. Regularly check to see if handrails are loose or broken. Make sure that both sides of any steps have handrails. Any raised decks and porches should have guardrails on the edges. Have any leaves, snow, or ice cleared regularly. Use sand or salt on walking paths during winter. Clean up any spills in your garage right away. This includes oil or grease spills. What can I do in the bathroom? Use night lights. Install grab bars by the toilet and in the tub and shower. Do not use towel bars as grab bars. Use non-skid mats or decals in the tub or shower. If you need to sit down in the shower, use a plastic, non-slip stool. Keep the floor dry. Clean up any water that spills on the floor as soon as it happens. Remove soap buildup in the tub or shower regularly. Attach bath mats securely with double-sided non-slip rug tape. Do not have throw rugs and other things on the floor that can make you trip. What can I do in the bedroom? Use night lights. Make sure that you have a light by your bed that is easy to  reach. Do not use any sheets or blankets that are too big for your bed. They should not hang down onto the floor. Have a firm chair that has side arms. You can use this for support while you get dressed. Do not have throw rugs and other things on the floor that can make you trip. What can I do in the kitchen? Clean up any spills right away. Avoid walking on wet floors. Keep items that you use a lot in easy-to-reach places. If you need to reach something above you, use a strong step stool that has a grab bar. Keep electrical cords out of the way. Do not use floor polish or wax that makes floors slippery. If you must use wax, use non-skid floor wax. Do not have throw rugs and other things on the floor that can make you trip. What can I do with my stairs? Do not leave any items on the stairs. Make sure that there are handrails on both sides of the stairs and use them. Fix handrails that are broken or loose. Make sure that handrails are as long as the stairways. Check any carpeting to make sure that it is firmly attached to the stairs. Fix any carpet that is loose or worn. Avoid having throw rugs at the top or bottom of the stairs. If you do have throw rugs, attach them to the floor with carpet tape. Make sure that you have a light  switch at the top of the stairs and the bottom of the stairs. If you do not have them, ask someone to add them for you. What else can I do to help prevent falls? Wear shoes that: Do not have high heels. Have rubber bottoms. Are comfortable and fit you well. Are closed at the toe. Do not wear sandals. If you use a stepladder: Make sure that it is fully opened. Do not climb a closed stepladder. Make sure that both sides of the stepladder are locked into place. Ask someone to hold it for you, if possible. Clearly mark and make sure that you can see: Any grab bars or handrails. First and last steps. Where the edge of each step is. Use tools that help you move  around (mobility aids) if they are needed. These include: Canes. Walkers. Scooters. Crutches. Turn on the lights when you go into a dark area. Replace any light bulbs as soon as they burn out. Set up your furniture so you have a clear path. Avoid moving your furniture around. If any of your floors are uneven, fix them. If there are any pets around you, be aware of where they are. Review your medicines with your doctor. Some medicines can make you feel dizzy. This can increase your chance of falling. Ask your doctor what other things that you can do to help prevent falls. This information is not intended to replace advice given to you by your health care provider. Make sure you discuss any questions you have with your health care provider. Document Released: 09/20/2009 Document Revised: 05/01/2016 Document Reviewed: 12/29/2014 Elsevier Interactive Patient Education  2017 [employer].

## 2021-07-13 ENCOUNTER — Other Ambulatory Visit: Payer: Self-pay | Admitting: Family Medicine

## 2021-08-08 DIAGNOSIS — E7800 Pure hypercholesterolemia, unspecified: Secondary | ICD-10-CM

## 2021-08-08 HISTORY — DX: Pure hypercholesterolemia, unspecified: E78.00

## 2021-08-14 ENCOUNTER — Other Ambulatory Visit: Payer: Self-pay | Admitting: Family Medicine

## 2021-08-15 DIAGNOSIS — G43019 Migraine without aura, intractable, without status migrainosus: Secondary | ICD-10-CM | POA: Diagnosis not present

## 2021-08-15 DIAGNOSIS — G43719 Chronic migraine without aura, intractable, without status migrainosus: Secondary | ICD-10-CM | POA: Diagnosis not present

## 2021-08-19 ENCOUNTER — Telehealth: Payer: Self-pay | Admitting: Family Medicine

## 2021-08-20 MED ORDER — PANTOPRAZOLE SODIUM 40 MG PO TBEC: DELAYED_RELEASE_TABLET | ORAL | 0 refills | Status: DC

## 2021-08-20 MED ORDER — LOSARTAN POTASSIUM 50 MG PO TABS: ORAL_TABLET | ORAL | 0 refills | Status: DC

## 2021-08-20 NOTE — Telephone Encounter (Signed)
  Encourage patient to contact the pharmacy for refills or they can request refills through [facility]  LAST APPOINTMENT DATE:  02/22/21  NEXT APPOINTMENT DATE:09/09/21  MEDICATION: losartan (COZAAR) 50 MG tablet // pantoprazole (PROTONIX) 40 MG tablet  PHARMACY: [id number] - [place], [place] - [address] [facility] [address]  Let patient know to contact pharmacy at the end of the day to make sure medication is ready.  Please notify patient to allow 48-72 hours to process

## 2021-08-20 NOTE — Addendum Note (Signed)
Addended by: Emi Holes D on: 08/20/2021 10:23 AM   Modules accepted: Orders

## 2021-08-20 NOTE — Telephone Encounter (Signed)
Tried calling patient, unable to LVM. Refills sent for 90 d supply, patient must keep upcoming appointment.

## 2021-08-20 NOTE — Telephone Encounter (Signed)
Pt returned call and was advised refills sent.

## 2021-08-26 ENCOUNTER — Encounter: Payer: Medicare HMO | Admitting: Family Medicine

## 2021-09-09 ENCOUNTER — Other Ambulatory Visit: Payer: Self-pay

## 2021-09-09 ENCOUNTER — Ambulatory Visit (INDEPENDENT_AMBULATORY_CARE_PROVIDER_SITE_OTHER): Payer: Medicare HMO | Admitting: Family Medicine

## 2021-09-09 ENCOUNTER — Encounter: Payer: Self-pay | Admitting: Family Medicine

## 2021-09-09 VITALS — BP 123/81 | HR 75 | Temp 97.5°F | Ht 65.0 in | Wt 165.2 lb

## 2021-09-09 DIAGNOSIS — I5189 Other ill-defined heart diseases: Secondary | ICD-10-CM

## 2021-09-09 DIAGNOSIS — N183 Chronic kidney disease, stage 3 unspecified: Secondary | ICD-10-CM | POA: Diagnosis not present

## 2021-09-09 DIAGNOSIS — R7303 Prediabetes: Secondary | ICD-10-CM | POA: Diagnosis not present

## 2021-09-09 DIAGNOSIS — Z0000 Encounter for general adult medical examination without abnormal findings: Secondary | ICD-10-CM | POA: Diagnosis not present

## 2021-09-09 DIAGNOSIS — Z1239 Encounter for other screening for malignant neoplasm of breast: Secondary | ICD-10-CM | POA: Diagnosis not present

## 2021-09-09 DIAGNOSIS — I10 Essential (primary) hypertension: Secondary | ICD-10-CM

## 2021-09-09 DIAGNOSIS — Z124 Encounter for screening for malignant neoplasm of cervix: Secondary | ICD-10-CM | POA: Diagnosis not present

## 2021-09-09 LAB — CBC WITH DIFFERENTIAL/PLATELET
Basophils Absolute: 0 10*3/uL (ref 0.0–0.1)
Basophils Relative: 0.6 % (ref 0.0–3.0)
Eosinophils Absolute: 0.2 10*3/uL (ref 0.0–0.7)
Eosinophils Relative: 3.1 % (ref 0.0–5.0)
HCT: 40.6 % (ref 36.0–46.0)
Hemoglobin: 14 g/dL (ref 12.0–15.0)
Lymphocytes Relative: 33.8 % (ref 12.0–46.0)
Lymphs Abs: 2.5 10*3/uL (ref 0.7–4.0)
MCHC: 34.5 g/dL (ref 30.0–36.0)
MCV: 92.2 fl (ref 78.0–100.0)
Monocytes Absolute: 0.5 10*3/uL (ref 0.1–1.0)
Monocytes Relative: 7.3 % (ref 3.0–12.0)
Neutro Abs: 4.1 10*3/uL (ref 1.4–7.7)
Neutrophils Relative %: 55.2 % (ref 43.0–77.0)
Platelets: 348 10*3/uL (ref 150.0–400.0)
RBC: 4.4 Mil/uL (ref 3.87–5.11)
RDW: 12.6 % (ref 11.5–15.5)
WBC: 7.5 10*3/uL (ref 4.0–10.5)

## 2021-09-09 LAB — COMPREHENSIVE METABOLIC PANEL
ALT: 17 U/L (ref 0–35)
AST: 14 U/L (ref 0–37)
Albumin: 4.3 g/dL (ref 3.5–5.2)
Alkaline Phosphatase: 122 U/L — ABNORMAL HIGH (ref 39–117)
BUN: 16 mg/dL (ref 6–23)
CO2: 25 mEq/L (ref 19–32)
Calcium: 9.7 mg/dL (ref 8.4–10.5)
Chloride: 104 mEq/L (ref 96–112)
Creatinine, Ser: 1.05 mg/dL (ref 0.40–1.20)
GFR: 60.7 mL/min (ref >60.00)
Glucose, Bld: 99 mg/dL (ref 70–99)
Potassium: 4.1 mEq/L (ref 3.5–5.1)
Sodium: 139 mEq/L (ref 135–145)
Total Bilirubin: 0.4 mg/dL (ref 0.2–1.2)
Total Protein: 7.2 g/dL (ref 6.0–8.3)

## 2021-09-09 LAB — LIPID PANEL
Cholesterol: 213 mg/dL — ABNORMAL HIGH (ref 0–200)
HDL: 38.5 mg/dL — ABNORMAL LOW (ref >39.00)
LDL Cholesterol: 141 mg/dL — ABNORMAL HIGH (ref 0–99)
NonHDL: 174.55
Total CHOL/HDL Ratio: 6
Triglycerides: 168 mg/dL — ABNORMAL HIGH (ref 0.0–149.0)
VLDL: 33.6 mg/dL (ref 0.0–40.0)

## 2021-09-09 LAB — TSH: TSH: 2.3 u[IU]/mL (ref 0.35–5.50)

## 2021-09-09 LAB — HEMOGLOBIN A1C: Hgb A1c MFr Bld: 6 % (ref 4.6–6.5)

## 2021-09-09 NOTE — Patient Instructions (Addendum)
Please contact [facility] to schedule for colonoscopy at [phone]  Health Maintenance, Female Adopting a healthy lifestyle and getting preventive care are important in promoting health and wellness. Ask your health care provider about: The right schedule for you to have regular tests and exams. Things you can do on your own to prevent diseases and keep yourself healthy. What should I know about diet, weight, and exercise? Eat a healthy diet  Eat a diet that includes plenty of vegetables, fruits, low-fat dairy products, and lean protein. Do not eat a lot of foods that are high in solid fats, added sugars, or sodium. Maintain a healthy weight Body mass index (BMI) is used to identify weight problems. It estimates body fat based on height and weight. Your health care provider can help determine your BMI and help you achieve or maintain a healthy weight. Get regular exercise Get regular exercise. This is one of the most important things you can do for your health. Most adults should: Exercise for at least 150 minutes each week. The exercise should increase your heart rate and make you sweat (moderate-intensity exercise). Do strengthening exercises at least twice a week. This is in addition to the moderate-intensity exercise. Spend less time sitting. Even light physical activity can be beneficial. Watch cholesterol and blood lipids Have your blood tested for lipids and cholesterol at 53 years of age, then have this test every 5 years. Have your cholesterol levels checked more often if: Your lipid or cholesterol levels are high. You are older than 53 years of age. You are at high risk for heart disease. What should I know about cancer screening? Depending on your health history and family history, you may need to have cancer screening at various ages. This may include screening for: Breast cancer. Cervical cancer. Colorectal cancer. Skin cancer. Lung cancer. What should I know about  heart disease, diabetes, and high blood pressure? Blood pressure and heart disease High blood pressure causes heart disease and increases the risk of stroke. This is more likely to develop in people who have high blood pressure readings, are of African descent, or are overweight. Have your blood pressure checked: Every 3-5 years if you are 55-76 years of age. Every year if you are 62 years old or older. Diabetes Have regular diabetes screenings. This checks your fasting blood sugar level. Have the screening done: Once every three years after age 54 if you are at a normal weight and have a low risk for diabetes. More often and at a younger age if you are overweight or have a high risk for diabetes. What should I know about preventing infection? Hepatitis B If you have a higher risk for hepatitis B, you should be screened for this virus. Talk with your health care provider to find out if you are at risk for hepatitis B infection. Hepatitis C Testing is recommended for: Everyone born from [birth year] through 1963. Anyone with known risk factors for hepatitis C. Sexually transmitted infections (STIs) Get screened for STIs, including gonorrhea and chlamydia, if: You are sexually active and are younger than 53 years of age. You are older than 53 years of age and your health care provider tells you that you are at risk for this type of infection. Your sexual activity has changed since you were last screened, and you are at increased risk for chlamydia or gonorrhea. Ask your health care provider if you are at risk. Ask your health care provider about whether you are at high risk  for HIV. Your health care provider may recommend a prescription medicine to help prevent HIV infection. If you choose to take medicine to prevent HIV, you should first get tested for HIV. You should then be tested every 3 months for as long as you are taking the medicine. Pregnancy If you are about to stop having your period  (premenopausal) and you may become pregnant, seek counseling before you get pregnant. Take 400 to 800 micrograms (mcg) of folic acid every day if you become pregnant. Ask for birth control (contraception) if you want to prevent pregnancy. Osteoporosis and menopause Osteoporosis is a disease in which the bones lose minerals and strength with aging. This can result in bone fractures. If you are 64 years old or older, or if you are at risk for osteoporosis and fractures, ask your health care provider if you should: Be screened for bone loss. Take a calcium or vitamin D supplement to lower your risk of fractures. Be given hormone replacement therapy (HRT) to treat symptoms of menopause. Follow these instructions at home: Lifestyle Do not use any products that contain nicotine or tobacco, such as cigarettes, e-cigarettes, and chewing tobacco. If you need help quitting, ask your health care provider. Do not use street drugs. Do not share needles. Ask your health care provider for help if you need support or information about quitting drugs. Alcohol use Do not drink alcohol if: Your health care provider tells you not to drink. You are pregnant, may be pregnant, or are planning to become pregnant. If you drink alcohol: Limit how much you use to 0-1 drink a day. Limit intake if you are breastfeeding. Be aware of how much alcohol is in your drink. In the [place] one drink equals one 12 oz bottle of beer (355 mL), one 5 oz glass of wine (148 mL), or one 1 oz glass of hard liquor (44 mL). General instructions Schedule regular health, dental, and eye exams. Stay current with your vaccines. Tell your health care provider if: You often feel depressed. You have ever been abused or do not feel safe at home. Summary Adopting a healthy lifestyle and getting preventive care are important in promoting health and wellness. Follow your health care provider's instructions about healthy diet, exercising, and  getting tested or screened for diseases. Follow your health care provider's instructions on monitoring your cholesterol and blood pressure. This information is not intended to replace advice given to you by your health care provider. Make sure you discuss any questions you have with your health care provider. Document Revised: 02/01/2021 Document Reviewed: 11/17/2018 Elsevier Patient Education  2022 [employer].

## 2021-09-09 NOTE — Progress Notes (Signed)
Office Note 09/09/2021  CC:  Chief Complaint  Patient presents with   Annual Exam    Pt is fasting    HPI:  Patient is a 55 y.o. female who is here for annual health maintenance exam and 6 mo f/u HTN, chronic diastolic HF, and CRI III. A/P as of last visit: "1) HTN: well controlled. Lytes/cr today. Told pt to watch intake of ibup and see if bp or swelling went up after taking doses of this med.   2) LE swelling: minimal problem of late. Has lasix 20mg  that she occ takes. Cont good bp control, low Na intake, avoid NSAIDs if possible. Told pt to watch intake of ibup and see if bp or swelling went up after taking doses of this med.   3) CRI III: takes NSAIDs on infrequent basis. Needs to focus on more water intake. Lytes/cr today.   4) Prediabetes, overweight (BMI 27). +FH DM (father). Encouraged her to work on lower carb intake, inc exercise. Hba1c today.   5) Dyspnea: cardiac etiology ruled out. Cards working with her to either get PFTs or get her to pulm for r/o primary pulm etiology. Seems like she's better on this issue lately, perhaps b/c distracted more by external/social/life stressors lately."  INTERIM HX: No acute complaints. Very busy lately, more than usual: taking care of 4 homes (a cousin and her father died), son very busy with soccer and she goes to all games).   Home BPs:  best of her recollection, systolics 120s-160, diast 90s-100. BPs fluctuating more lately b/c migraine pattern more erratic lately. Takes losartan 50qd.   CRI III:  nsaid only every once in a while.  Diastolic HF, LE edema: takes lasix avg of once a week.  Takes her potassium q3d, trying to eat higher potassium diet.  Walking some for exercise, feels no limitations.   Past Medical History:  Diagnosis Date   Allergic rhinitis    Cervical spondylosis 08/2020   C8 radiculopathy->Surgery 08/2020 (Dr. 09/2020)   Chronic renal insufficiency, stage 3 (moderate) (HCC)    GFR 50s    COVID-19    12/2020   Diastolic dysfunction 09/2020 echo   grade II   Fibromyalgia    Heart defect    PFO--closure procedure approx 2009 (Dr. 2010)   Hypertension    IFG (impaired fasting glucose) 08/2019   Gluc 106->A1c 5.6%   Insomnia    Migraine syndrome    Mild mitral valve regurgitation 09/17/2020   09/2020 echo-->mild/mod MVR   Scoliosis     Past Surgical History:  Procedure Laterality Date   APPENDECTOMY  6th grade   CARDIAC SURGERY     PFO closure 2009   CARDIOVASCULAR STRESS TEST  10/10/2020   chronotropic incompetence.  No ischemia, normal LV fxn, normal wall motion, EF 70%.   CERVICAL SPINE SURGERY  remote past; also 08/2020   discectomy x 1, then discectomy with titanium fusion. 08/2020 C spine surg again (Dr. 09/2020)   CHOLECYSTECTOMY  prior to 2010   COLONOSCOPY     "1990s" --reason unknown   FOOT SURGERY  approx 1990   benign mass removed from bottom of foot   PFTs  01/13/2019   Minimal obstructive airway dz; no response to bronchodilator   TRANSTHORACIC ECHOCARDIOGRAM  09/17/2020   EF 60-65%, grd II DD, mild/mod MVR    Family History  Problem Relation Age of Onset   Heart disease Father    Diabetes Father    Kidney disease Father  Stroke Father    Heart disease Sister     Social History   Socioeconomic History   Marital status: Married    Spouse name: Not on file   Number of children: Not on file   Years of education: Not on file   Highest education level: Not on file  Occupational History   Not on file  Tobacco Use   Smoking status: Never   Smokeless tobacco: Never  Substance and Sexual Activity   Alcohol use: No   Drug use: No   Sexual activity: Not on file  Other Topics Concern   Not on file  Social History Narrative   Widow, 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   Educ: "4 yr degree"   Former occup: Architect.  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     Denies tob or alc or street drug use.   Says she was  once on oxycontin for fibromyalgia pain but this was in the far remote past.   Social Determinants of Health   Financial Resource Strain: Low Risk    Difficulty of Paying Living Expenses: Not very hard  Food Insecurity: No Food Insecurity   Worried About Programme researcher, broadcasting/film/video in the Last Year: Never true   Ran Out of Food in the Last Year: Never true  Transportation Needs: No Transportation Needs   Lack of Transportation (Medical): No   Lack of Transportation (Non-Medical): No  Physical Activity: Insufficiently Active   Days of Exercise per Week: 4 days   Minutes of Exercise per Session: 10 min  Stress: Stress Concern Present   Feeling of Stress : To some extent  Social Connections: Socially Isolated   Frequency of Communication with Friends and Family: Three times a week   Frequency of Social Gatherings with Friends and Family: Three times a week   Attends Religious Services: Never   Active Member of Clubs or Organizations: No   Attends Banker Meetings: Never   Marital Status: Divorced  Catering manager Violence: Not At Risk   Fear of Current or Ex-Partner: No   Emotionally Abused: No   Physically Abused: No   Sexually Abused: No    Outpatient Medications Prior to Visit  Medication Sig Dispense Refill   carisoprodol (SOMA) 350 MG tablet Take 1 tablet by mouth 4 (four) times daily as needed.     cetirizine (ZYRTEC) 10 MG tablet Take 10 mg by mouth daily.     fluticasone (FLONASE) 50 MCG/ACT nasal spray Place into both nostrils daily.     furosemide (LASIX) 20 MG tablet TAKE 1 TABLET(20 MG) BY MOUTH DAILY 90 tablet 1   losartan (COZAAR) 50 MG tablet TAKE 1 TABLET(50 MG) BY MOUTH DAILY 90 tablet 0   magnesium gluconate (MAGONATE) 500 MG tablet Take 500 mg by mouth 2 (two) times daily.     Melatonin 5 MG TABS Take 10 mg by mouth daily as needed.      metoprolol succinate (TOPROL-XL) 100 MG 24 hr tablet TAKE 1 TABLET BY MOUTH DAILY IMMEDIATELY AFTER A MEAL 90 tablet 1    Milnacipran HCl (SAVELLA PO) Take by mouth 2 (two) times daily.     naratriptan (AMERGE) 2.5 MG tablet Take 2.5 mg by mouth as needed for migraine. Take one (1) tablet at onset of headache; if returns or does not resolve, may repeat after 4 hours; do not exceed five (5) mg in 24 hours.      pantoprazole (  PROTONIX) 40 MG tablet TAKE 1 TABLET(40 MG) BY MOUTH DAILY 90 tablet 0   potassium chloride SA (KLOR-CON) 20 MEQ tablet Take 1 tablet (20 mEq total) by mouth daily. 30 tablet 5   zolmitriptan (ZOMIG) 5 MG tablet Take by mouth.     zolpidem (AMBIEN CR) 12.5 MG CR tablet Take 12.5 mg by mouth at bedtime as needed for sleep.     zonisamide (ZONEGRAN) 100 MG capsule Take by mouth.     predniSONE (DELTASONE) 20 MG tablet Take one tab by mouth twice daily for 4 days, then one daily for 3 days. Take with food. 11 tablet 0   No facility-administered medications prior to visit.    No Known Allergies  ROS Review of Systems  Constitutional:  Negative for appetite change, chills, fatigue and fever.  HENT:  Negative for congestion, dental problem, ear pain and sore throat.   Eyes:  Negative for discharge, redness and visual disturbance.  Respiratory:  Negative for cough, chest tightness, shortness of breath and wheezing.   Cardiovascular:  Negative for chest pain, palpitations and leg swelling.  Gastrointestinal:  Negative for abdominal pain, blood in stool, diarrhea, nausea and vomiting.  Genitourinary:  Negative for difficulty urinating, dysuria, flank pain, frequency, hematuria and urgency.  Musculoskeletal:  Negative for arthralgias, back pain, joint swelling, myalgias and neck stiffness.  Skin:  Negative for pallor and rash.  Neurological:  Negative for dizziness, speech difficulty, weakness and headaches.  Hematological:  Negative for adenopathy. Does not bruise/bleed easily.  Psychiatric/Behavioral:  Negative for confusion and sleep disturbance. The patient is not nervous/anxious.     PE; Vitals with BMI 09/09/2021 07/10/2021 03/19/2021  Height 5\' 5"  (No Data) -  Weight 165 lbs 3 oz (No Data) -  BMI 27.49 - -  Systolic 123 (No Data) 131  Diastolic 81 (No Data) 87  Pulse 75 - 86    Exam chaperoned by , CMA. Gen: Alert, well appearing.  Patient is oriented to person, place, time, and situation. AFFECT: pleasant, lucid thought and speech. ENT: Ears: EACs clear, normal epithelium.  TMs with good light reflex and landmarks bilaterally.  Eyes: no injection, icteris, swelling, or exudate.  EOMI, PERRLA. Nose: no drainage or turbinate edema/swelling.  No injection or focal lesion.  Mouth: lips without lesion/swelling.  Oral mucosa pink and moist.  Dentition intact and without obvious caries or gingival swelling.  Oropharynx without erythema, exudate, or swelling.  Neck: supple/nontender.  No LAD, mass, or TM.  Carotid pulses 2+ bilaterally, without bruits. CV: RRR, no m/r/g.   LUNGS: CTA bilat, nonlabored resps, good aeration in all lung fields. ABD: soft, NT, ND, BS normal.  No hepatospenomegaly or mass.  No bruits. EXT: no clubbing, cyanosis, or edema.  Musculoskeletal: no joint swelling, erythema, warmth, or tenderness.  ROM of all joints intact. Skin - no sores or suspicious lesions or rashes or color changes  Pertinent labs:  Lab Results  Component Value Date   TSH 1.79 09/25/2020   Lab Results  Component Value Date   WBC 7.0 09/25/2020   HGB 13.7 09/25/2020   HCT 40.2 09/25/2020   MCV 92.8 09/25/2020   PLT 306.0 09/25/2020   Lab Results  Component Value Date   CREATININE 0.91 02/22/2021   BUN 17 02/22/2021   NA 139 02/22/2021   K 4.5 02/22/2021   CL 103 02/22/2021   CO2 28 02/22/2021   Lab Results  Component Value Date   ALT 13 09/25/2020   AST  13 09/25/2020   ALKPHOS 103 09/25/2020   BILITOT 0.3 09/25/2020   Lab Results  Component Value Date   CHOL 192 09/28/2020   Lab Results  Component Value Date   HDL 40 09/28/2020    Lab Results  Component Value Date   LDLCALC 116 (H) 09/28/2020   Lab Results  Component Value Date   TRIG 204 (H) 09/28/2020   Lab Results  Component Value Date   CHOLHDL 4 08/11/2019   Lab Results  Component Value Date   HGBA1C 5.7 02/22/2021   ASSESSMENT AND PLAN:   1) HTN: these fluctuate with her migraine pain. No change in med today: losartan 50 qd and toprol xl 100 qd. Lytes/cr today.  2) CRI III: she now takes only occ nsaid. Lytes/cr today.  3) Diastolic dysfxn, LE edema: no prob with DOE or edema lately.  Has lasix 20mg  to take qd prn. Lytes/cr today.  5) Health maintenance exam: Reviewed age and gender appropriate health maintenance issues (prudent diet, regular exercise, health risks of tobacco and excessive alcohol, use of seatbelts, fire alarms in home, use of sunscreen).  Also reviewed age and gender appropriate health screening as well as vaccine recommendations. Vaccines: Tdap->declined.  Flu->declined. Shingrix->rx sent to pharmacy. Labs: fasting HP +Hba1c (prediabetes). Cervical ca screening: referred to GYN today. Breast ca screening: mammogram ordered 07/2021 but she has been so busy, doesn't recall getting any call to schedule.  She decided to defer this until she sees a GYN (referral ordered). Colon ca screening: I ordered referral to GI 07/2021->gave pt GI office # so she could call to schedule.   An After Visit Summary was printed and given to the patient.  FOLLOW UP:  Return in about 6 months (around 03/10/2022) for routine chronic illness f/u.  Signed:  05/10/2022, MD           09/09/2021

## 2021-09-12 ENCOUNTER — Other Ambulatory Visit: Payer: Self-pay

## 2021-09-12 MED ORDER — METOPROLOL SUCCINATE ER 100 MG PO TB24: ORAL_TABLET | ORAL | 1 refills | Status: DC

## 2021-10-13 ENCOUNTER — Other Ambulatory Visit: Payer: Self-pay | Admitting: Family Medicine

## 2021-11-13 ENCOUNTER — Other Ambulatory Visit: Payer: Self-pay | Admitting: Family Medicine

## 2021-11-15 ENCOUNTER — Other Ambulatory Visit: Payer: Self-pay | Admitting: Family Medicine

## 2022-01-13 ENCOUNTER — Other Ambulatory Visit: Payer: Self-pay

## 2022-01-13 ENCOUNTER — Telehealth: Payer: Self-pay

## 2022-01-13 MED ORDER — LOSARTAN POTASSIUM 50 MG PO TABS: ORAL_TABLET | ORAL | 0 refills | Status: DC

## 2022-01-13 NOTE — Telephone Encounter (Signed)
Patient refill request.  Patient has changed pharmacy location.  Please update preferred pharmacy to [facility] [address], [address], [place]  losartan (COZAAR) 50 MG tablet [[id number]]

## 2022-01-13 NOTE — Telephone Encounter (Signed)
Rx sent in and pharmacy updated. LVM for pt regarding this.

## 2022-01-14 NOTE — Telephone Encounter (Signed)
[  8:57 AM] Ragin, Courtney Haynes is returning a call  [9:00 AM] Ragin, Courtney Haynes mid, I told her the med was sent to her preferred pharmacy.

## 2022-02-10 ENCOUNTER — Other Ambulatory Visit: Payer: Self-pay | Admitting: Family Medicine

## 2022-02-12 DIAGNOSIS — M797 Fibromyalgia: Secondary | ICD-10-CM | POA: Diagnosis not present

## 2022-02-12 DIAGNOSIS — G43019 Migraine without aura, intractable, without status migrainosus: Secondary | ICD-10-CM | POA: Diagnosis not present

## 2022-03-04 ENCOUNTER — Emergency Department (INDEPENDENT_AMBULATORY_CARE_PROVIDER_SITE_OTHER)
Admission: EM | Admit: 2022-03-04 | Discharge: 2022-03-04 | Disposition: A | Discharge status: Home / Self Care | Payer: Medicare HMO | Source: Home / Self Care

## 2022-03-04 ENCOUNTER — Other Ambulatory Visit: Payer: Self-pay

## 2022-03-04 DIAGNOSIS — U071 COVID-19: Secondary | ICD-10-CM | POA: Diagnosis not present

## 2022-03-04 LAB — POC SARS CORONAVIRUS 2 AG -  ED: SARS Coronavirus 2 Ag: POSITIVE — AB

## 2022-03-04 MED ORDER — PAXLOVID (300/100) 20 X 150 MG & 10 X 100MG PO TBPK: ORAL_TABLET | ORAL | 0 refills | Status: DC

## 2022-03-04 NOTE — Discharge Instructions (Signed)
You must stay home quarantine for 5 days.  After this wear a mask for an additional 5 days if you leave the house ?Drink lots of fluids ?You may take over-the-counter cough and cold medicines as needed ?Take Tylenol or ibuprofen for pain and fever ?Take Paxlovid 2 times a day for 5 days.  (Manufactured by [employer]) ?Call your doctor for any problems.  Stay home unless you become much worse and need to go to the emergency room ?

## 2022-03-04 NOTE — ED Provider Notes (Signed)
?[place] ? ? ? ?CSN: [id number] ?Arrival date & time: 03/04/22  1818 ? ? ?  ? ?History   ?Chief Complaint ?Chief Complaint  ?Patient presents with  ? Fever  ? Headache  ? Sore Throat  ? ? ?HPI ?Courtney Haynes is a 56 y.o. female.  ? ?HPI ? ?56 year old patient with hypertension hyperlipidemia valvular heart disease and migraines he is here for fever, sore throat, and headache that started yesterday.  Is been worsening today.  Tmax 103.  She is taking Tylenol.  She has recently found out that her uncle is positive for COVID.  She visited him within the week. ? ?Past Medical History:  ?Diagnosis Date  ? Allergic rhinitis   ? Cervical spondylosis 08/2020  ? C8 radiculopathy->Surgery 08/2020 (Dr. Ronnald Ramp)  ? Chronic renal insufficiency, stage 3 (moderate) (HCC)   ? GFR 50s  ? COVID-19   ? 12/2020  ? Diastolic dysfunction 123456 echo  ? grade II  ? Fibromyalgia   ? Heart defect   ? PFO--closure procedure approx 2009 (Dr. Einar Gip)  ? Hypercholesterolemia 08/2021  ? mild, frmhm cv risk 3.5%->TLC  ? Hypertension   ? Insomnia   ? Migraine syndrome   ? Mild mitral valve regurgitation 09/17/2020  ? 09/2020 echo-->mild/mod MVR  ? prediab 08/2019  ? 2020 Gluc 106->A1c 5.6%.  08/2021 Hba1c 6%.  ? Scoliosis   ? ? ?Patient Active Problem List  ? Diagnosis Date Noted  ? Radiculopathy, cervicothoracic region 07/31/2020  ? Fibromyalgia 03/25/2012  ? Essential hypertension 01/30/2009  ? Migraine, unspecified, not intractable, without status migrainosus 01/30/2009  ? Myalgia and myositis, unspecified 01/30/2009  ? Neuromyositis 01/30/2009  ? Thrombophlebitis of superficial veins of lower extremity 01/30/2009  ? ? ?Past Surgical History:  ?Procedure Laterality Date  ? APPENDECTOMY  6th grade  ? CARDIAC SURGERY    ? PFO closure 2009  ? CARDIOVASCULAR STRESS TEST  10/10/2020  ? chronotropic incompetence.  No ischemia, normal LV fxn, normal wall motion, EF 70%.  ? CERVICAL SPINE SURGERY  remote past; also 08/2020  ? discectomy x 1,  then discectomy with titanium fusion. 08/2020 C spine surg again (Dr. Ronnald Ramp)  ? CHOLECYSTECTOMY  prior to 2010  ? COLONOSCOPY    ? "1990s" --reason unknown  ? FOOT SURGERY  approx 1990  ? benign mass removed from bottom of foot  ? PFTs  01/13/2019  ? Minimal obstructive airway dz; no response to bronchodilator  ? TRANSTHORACIC ECHOCARDIOGRAM  09/17/2020  ? EF 60-65%, grd II DD, mild/mod MVR  ? ? ?OB History   ?No obstetric history on file. ?  ? ? ? ?Home Medications   ? ?Prior to Admission medications   ?Medication Sig Start Date End Date Taking? Authorizing Provider  ?nirmatrelvir & ritonavir (PAXLOVID, 300/100,) 20 x 150 MG & 10 x 100MG  TBPK Take medication as directed BID for 5 days 03/04/22  Yes Raylene Everts, MD  ?cetirizine (ZYRTEC) 10 MG tablet Take 10 mg by mouth daily.    [provider]  ?eletriptan (RELPAX) 40 MG tablet Take by mouth. 02/26/22   [provider]  ?fluticasone (FLONASE) 50 MCG/ACT nasal spray Place into both nostrils daily.    [provider]  ?furosemide (LASIX) 20 MG tablet TAKE 1 TABLET(20 MG) BY MOUTH DAILY 10/15/21   McGowen, Adrian Blackwater, MD  ?losartan (COZAAR) 50 MG tablet TAKE 1 TABLET(50 MG) BY MOUTH DAILY 01/13/22   McGowen, Adrian Blackwater, MD  ?magnesium gluconate (MAGONATE) 500 MG  tablet Take 500 mg by mouth 2 (two) times daily.    [provider]  ?Melatonin 5 MG TABS Take 10 mg by mouth daily as needed.     [provider]  ?metoprolol succinate (TOPROL-XL) 100 MG 24 hr tablet Take with or immediately following a meal. 09/12/21   McGowen, Adrian Blackwater, MD  ?Milnacipran HCl (SAVELLA PO) Take by mouth 2 (two) times daily.    [provider]  ?naratriptan (AMERGE) 2.5 MG tablet Take 2.5 mg by mouth as needed for migraine. Take one (1) tablet at onset of headache; if returns or does not resolve, may repeat after 4 hours; do not exceed five (5) mg in 24 hours.     [provider]  ?pantoprazole (PROTONIX) 40 MG tablet TAKE 1 TABLET(40  MG) BY MOUTH DAILY 02/10/22   McGowen, Adrian Blackwater, MD  ?potassium chloride SA (KLOR-CON) 20 MEQ tablet Take 1 tablet (20 mEq total) by mouth daily. 09/26/20   McGowen, Adrian Blackwater, MD  ?zolmitriptan (ZOMIG) 5 MG tablet Take by mouth. 08/15/21 08/15/22  [provider]  ?zolpidem (AMBIEN CR) 12.5 MG CR tablet Take 12.5 mg by mouth at bedtime as needed for sleep.    [provider]  ?zonisamide (ZONEGRAN) 100 MG capsule Take by mouth. 08/15/21   [provider]  ? ? ?Family History ?Family History  ?Problem Relation Age of Onset  ? Heart disease Father   ? Diabetes Father   ? Kidney disease Father   ? Stroke Father   ? Heart disease Sister   ? ? ?Social History ?Social History  ? ?Tobacco Use  ? Smoking status: Never  ? Smokeless tobacco: Never  ?Substance Use Topics  ? Alcohol use: No  ? Drug use: No  ? ? ? ?Allergies   ?Patient has no known allergies. ? ? ?Review of Systems ?Review of Systems ?See HPI ? ?Physical Exam ?Triage Vital Signs ?ED Triage Vitals [03/04/22 1828]  ?Enc Vitals Group  ?   BP 135/89  ?   Pulse Rate (!) 116  ?   Resp 18  ?   Temp 98.4 ?F (36.9 ?C)  ?   Temp Source Oral  ?   SpO2 96 %  ?   Weight   ?   Height   ?   Head Circumference   ?   Peak Flow   ?   Pain Score 7  ?   Pain Loc   ?   Pain Edu?   ?   Excl. in [place]?   ? ?No data found. ? ?Updated Vital Signs ?BP 135/89 (BP Location: Right Arm)   Pulse (!) 116   Temp 98.4 ?F (36.9 ?C) (Oral)   Resp 18   SpO2 96%  ?:    ? ?Physical Exam ?Constitutional:   ?   General: She is not in acute distress. ?   Appearance: She is well-developed and normal weight. She is ill-appearing.  ?HENT:  ?   Head: Normocephalic and atraumatic.  ?Eyes:  ?   Conjunctiva/sclera: Conjunctivae normal.  ?   Pupils: Pupils are equal, round, and reactive to light.  ?Cardiovascular:  ?   Rate and Rhythm: Tachycardia present.  ?Pulmonary:  ?   Effort: Pulmonary effort is normal. No respiratory distress.  ?Abdominal:  ?   General: There is no distension.  ?    Palpations: Abdomen is soft.  ?Musculoskeletal:     ?   General: Normal range of motion.  ?  Cervical back: Normal range of motion.  ?Skin: ?   General: Skin is warm and dry.  ?Neurological:  ?   Mental Status: She is alert.  ? ? ? ?UC Treatments / Results  ?Labs ?(all labs ordered are listed, but only abnormal results are displayed) ?Labs Reviewed  ?POC SARS CORONAVIRUS 2 AG -  ED - Abnormal; Notable for the following components:  ?    Result Value  ? SARS Coronavirus 2 Ag Positive (*)   ? All other components within normal limits  ? ? ?EKG ? ? ?Radiology ?No results found. ? ?Procedures ?Procedures (including critical care time) ? ?Medications Ordered in UC ?Medications - No data to display ? ?Initial Impression / Assessment and Plan / UC Course  ?I have reviewed the triage vital signs and the nursing notes. ? ?Pertinent labs & imaging results that were available during my care of the patient were reviewed by me and considered in my medical decision making (see chart for details). ? ?  ? ?Patient is not vaccinated for COVID.  She is here with her sister.  We discussed the treatment for COVID.  I recommend Paxlovid 2 times a day for 5 days.  I reviewed her medications to make sure there were no contraindications.  I did look at her lab review and she has had a normal GFR on multiple measurements over the last year. ?Sister inquires whether ivermectin might help.  I told her that I was not comfortable prescribing this but could refer her if she desires ?Final Clinical Impressions(s) / UC Diagnoses  ? ?Final diagnoses:  ?COVID-19  ? ? ? ?Discharge Instructions   ? ?  ?You must stay home quarantine for 5 days.  After this wear a mask for an additional 5 days if you leave the house ?Drink lots of fluids ?You may take over-the-counter cough and cold medicines as needed ?Take Tylenol or ibuprofen for pain and fever ?Take Paxlovid 2 times a day for 5 days.  (Manufactured by [employer]) ?Call your doctor for any problems.   Stay home unless you become much worse and need to go to the emergency room ? ? ? ? ?ED Prescriptions   ? ? Medication Sig Dispense Auth. Provider  ? nirmatrelvir & ritonavir (PAXLOVID, 300/100,) 20 x 150 MG &

## 2022-03-04 NOTE — ED Triage Notes (Signed)
Pt c/o fever, sore throat and headache since yesterday. Worsening today. Tmax 103. Tylenol 45 mins ago. Found out Uncle whos in hospital who she visited sat is COVID pos.  ?

## 2022-03-05 ENCOUNTER — Telehealth: Payer: Self-pay

## 2022-03-05 NOTE — Telephone Encounter (Signed)
Msg from pharmacy informing us that they do not carry paxlovid. VM left with patient about pharmacy change. Call if any questions ?

## 2022-03-08 ENCOUNTER — Other Ambulatory Visit: Payer: Self-pay | Admitting: Family Medicine

## 2022-03-10 ENCOUNTER — Encounter: Payer: Self-pay | Admitting: Family Medicine

## 2022-03-10 ENCOUNTER — Telehealth (INDEPENDENT_AMBULATORY_CARE_PROVIDER_SITE_OTHER): Payer: Medicare HMO | Admitting: Family Medicine

## 2022-03-10 VITALS — BP 148/89 | HR 85 | Wt 160.0 lb

## 2022-03-10 DIAGNOSIS — I10 Essential (primary) hypertension: Secondary | ICD-10-CM

## 2022-03-10 DIAGNOSIS — I5032 Chronic diastolic (congestive) heart failure: Secondary | ICD-10-CM

## 2022-03-10 DIAGNOSIS — R7303 Prediabetes: Secondary | ICD-10-CM

## 2022-03-10 DIAGNOSIS — E7800 Pure hypercholesterolemia, unspecified: Secondary | ICD-10-CM | POA: Diagnosis not present

## 2022-03-10 MED ORDER — LOSARTAN POTASSIUM 100 MG PO TABS
100.0000 mg | ORAL_TABLET | Freq: Every day | ORAL | 1 refills | Status: DC
Start: 2022-03-10 — End: 2022-09-15

## 2022-03-10 MED ORDER — METOPROLOL SUCCINATE ER 100 MG PO TB24: ORAL_TABLET | ORAL | 1 refills | Status: DC

## 2022-03-10 MED ORDER — PANTOPRAZOLE SODIUM 40 MG PO TBEC: DELAYED_RELEASE_TABLET | ORAL | 1 refills | Status: DC

## 2022-03-10 NOTE — Progress Notes (Signed)
Virtual Visit via Video Note ? ?I connected with Courtney Haynes on 03/10/22 at 11:00 AM EDT by a video enabled telemedicine application and verified that I am speaking with the correct person using two identifiers. ? Location patient: [place] ?Location provider:work or home office ?Persons participating in the virtual visit: patient, provider ? ?I discussed the limitations and requested verbal permission for telemedicine visit. The patient expressed understanding and agreed to proceed. ? ?CC: ?56 y/o female being seen today for 6 mo f/u HTN, chronic diastolic HF, and prediabetes. ?A/P as of last visit: ?"1) HTN: these fluctuate with her migraine pain. ?No change in med today: losartan 50 qd and toprol xl 100 qd. ?Lytes/cr today. ?  ?2) CRI III: she now takes only occ nsaid. ?Lytes/cr today. ?  ?3) Diastolic dysfxn, LE edema: no prob with DOE or edema lately.  Has lasix 20mg  to take qd prn. ?Lytes/cr today. ?  ?5) Health maintenance exam: ?Reviewed age and gender appropriate health maintenance issues (prudent diet, regular exercise, health risks of tobacco and excessive alcohol, use of seatbelts, fire alarms in home, use of sunscreen).  Also reviewed age and gender appropriate health screening as well as vaccine recommendations. ?Vaccines: Tdap->declined.  Flu->declined. Shingrix->rx sent to pharmacy. ?Labs: fasting HP +Hba1c (prediabetes). ?Cervical ca screening: referred to GYN today. ?Breast ca screening: mammogram ordered 07/2021 but she has been so busy, doesn't recall getting any call to schedule.  She decided to defer this until she sees a GYN (referral ordered). ?Colon ca screening: I ordered referral to GI 07/2021->gave pt GI office # so she could call to schedule." ? ?INTERIM HX: ?+Covid illness 03/04/22, treated with paxlovid (urgent care). ?Symptoms are resolved except she feels more fatigued than usual. ? ?Home blood pressures upper 130s over 80s typically.  She says it does go up into the systolics greater than [id number] and  diastolics greater than 90 for couple days at a time during the time she has bad migraine headaches.  She does note that her legs swell more during these times.  She takes Lasix on a as needed basis and this helps. ? ?Her care soma and Ambien are rx'd by her headache specialist, Dr. Elvia Collum. ? ?Immunization History  ?Administered Date(s) Administered  ? Influenza,inj,Quad PF,6+ Mos 08/11/2019  ? ? ? ?ROS as above, plus--> +chronic fatigue.  No fevers, no CP, no SOB, no wheezing, no cough, no dizziness, no rashes, no melena/hematochezia.  No polyuria or polydipsia.  Chronic arthralgias and myalgias.  No joint swelling.  No focal weakness, paresthesias, or tremors.  No acute vision or hearing abnormalities.  No dysuria or unusual/new urinary urgency or frequency.  No recent changes in lower legs. ?No n/v/d or abd pain.  No palpitations.   ? ? ?Past Medical History:  ?Diagnosis Date  ? Allergic rhinitis   ? Cervical spondylosis 08/2020  ? C8 radiculopathy->Surgery 08/2020 (Dr. Ronnald Ramp)  ? Chronic renal insufficiency, stage 3 (moderate) (HCC)   ? GFR 50s  ? COVID-19   ? 12/2020  ? Diastolic dysfunction 123456 echo  ? grade II  ? Fibromyalgia   ? Heart defect   ? PFO--closure procedure approx 2009 (Dr. Einar Gip)  ? Hypercholesterolemia 08/2021  ? mild, frmhm cv risk 3.5%->[facility]  ? Hypertension   ? Insomnia   ? Migraine syndrome   ? Mild mitral valve regurgitation 09/17/2020  ? 09/2020 echo-->mild/mod MVR  ? prediab 08/2019  ? 2020 Gluc 106->A1c 5.6%.  08/2021 Hba1c 6%.  ? Scoliosis   ? ? ?  Past Surgical History:  ?Procedure Laterality Date  ? APPENDECTOMY  6th grade  ? CARDIAC SURGERY    ? PFO closure 2009  ? CARDIOVASCULAR STRESS TEST  10/10/2020  ? chronotropic incompetence.  No ischemia, normal LV fxn, normal wall motion, EF 70%.  ? CERVICAL SPINE SURGERY  remote past; also 08/2020  ? discectomy x 1, then discectomy with titanium fusion. 08/2020 C spine surg again (Dr. Ronnald Ramp)  ? CHOLECYSTECTOMY  prior to 2010  ?  COLONOSCOPY    ? "1990s" --reason unknown  ? FOOT SURGERY  approx 1990  ? benign mass removed from bottom of foot  ? PFTs  01/13/2019  ? Minimal obstructive airway dz; no response to bronchodilator  ? TRANSTHORACIC ECHOCARDIOGRAM  09/17/2020  ? EF 60-65%, grd II DD, mild/mod MVR  ? ? ? ?Current Outpatient Medications:  ?  carisoprodol (SOMA) 350 MG tablet, Take 350 mg by mouth 4 (four) times daily., Disp: , Rfl:  ?  cetirizine (ZYRTEC) 10 MG tablet, Take 10 mg by mouth daily., Disp: , Rfl:  ?  eletriptan (RELPAX) 40 MG tablet, Take by mouth., Disp: , Rfl:  ?  fluticasone (FLONASE) 50 MCG/ACT nasal spray, Place into both nostrils daily., Disp: , Rfl:  ?  furosemide (LASIX) 20 MG tablet, TAKE 1 TABLET(20 MG) BY MOUTH DAILY, Disp: 90 tablet, Rfl: 1 ?  losartan (COZAAR) 50 MG tablet, TAKE 1 TABLET(50 MG) BY MOUTH DAILY, Disp: 90 tablet, Rfl: 0 ?  magnesium gluconate (MAGONATE) 500 MG tablet, Take 500 mg by mouth 2 (two) times daily., Disp: , Rfl:  ?  Melatonin 5 MG TABS, Take 10 mg by mouth daily as needed. , Disp: , Rfl:  ?  metoprolol succinate (TOPROL-XL) 100 MG 24 hr tablet, Take with or immediately following a meal., Disp: 90 tablet, Rfl: 1 ?  Milnacipran HCl (SAVELLA PO), Take by mouth 2 (two) times daily., Disp: , Rfl:  ?  naratriptan (AMERGE) 2.5 MG tablet, Take 2.5 mg by mouth as needed for migraine. Take one (1) tablet at onset of headache; if returns or does not resolve, may repeat after 4 hours; do not exceed five (5) mg in 24 hours. , Disp: , Rfl:  ?  pantoprazole (PROTONIX) 40 MG tablet, TAKE 1 TABLET(40 MG) BY MOUTH DAILY, Disp: 30 tablet, Rfl: 0 ?  potassium chloride SA (KLOR-CON) 20 MEQ tablet, Take 1 tablet (20 mEq total) by mouth daily., Disp: 30 tablet, Rfl: 5 ?  zolmitriptan (ZOMIG) 5 MG tablet, Take by mouth., Disp: , Rfl:  ?  zolpidem (AMBIEN CR) 12.5 MG CR tablet, Take 12.5 mg by mouth at bedtime as needed for sleep., Disp: , Rfl:  ?  zonisamide (ZONEGRAN) 100 MG capsule, Take by mouth., Disp: ,  Rfl:  ? ?EXAM: ? ?VITALS per patient if applicable:  ? ?  0000000  ? 10:29 AM 03/04/2022  ?  6:28 PM 09/09/2021  ? 11:25 AM  ?Vitals with BMI  ?Height   5\' 5"   ?Weight 160 lbs  165 lbs 3 oz  ?BMI   27.49  ?Systolic 123456 [id number]  ?Diastolic 89 89 81  ?Pulse 85 116 75  ? ? ?GENERAL: alert, oriented, appears well and in no acute distress ? ?HEENT: atraumatic, conjunttiva clear, no obvious abnormalities on inspection of external nose and ears ? ?NECK: normal movements of the head and neck ? ?LUNGS: on inspection no signs of respiratory distress, breathing rate appears normal, no obvious gross SOB, gasping or wheezing ? ?CV:  no obvious cyanosis ? ?MS: moves all visible extremities without noticeable abnormality ? ?PSYCH/NEURO: pleasant and cooperative, no obvious depression or anxiety, speech and thought processing grossly intact ? ?LABS: none today ?  Chemistry   ?   ?Component Value Date/Time  ? NA 139 09/09/2021 1209  ? K 4.1 09/09/2021 1209  ? CL 104 09/09/2021 1209  ? CO2 25 09/09/2021 1209  ? BUN 16 09/09/2021 1209  ? CREATININE 1.05 09/09/2021 1209  ?    ?Component Value Date/Time  ? CALCIUM 9.7 09/09/2021 1209  ? ALKPHOS 122 (H) 09/09/2021 1209  ? AST 14 09/09/2021 1209  ? ALT 17 09/09/2021 1209  ? BILITOT 0.4 09/09/2021 1209  ?  ? ?Lab Results  ?Component Value Date  ? WBC 7.5 09/09/2021  ? HGB 14.0 09/09/2021  ? HCT 40.6 09/09/2021  ? MCV 92.2 09/09/2021  ? PLT 348.0 09/09/2021  ? ?Lab Results  ?Component Value Date  ? TSH 2.30 09/09/2021  ? ?Lab Results  ?Component Value Date  ? CHOL 213 (H) 09/09/2021  ? HDL 38.50 (L) 09/09/2021  ? LDLCALC 141 (H) 09/09/2021  ? LDLDIRECT 117 (H) 09/28/2020  ? TRIG 168.0 (H) 09/09/2021  ? CHOLHDL 6 09/09/2021  ? ?Lab Results  ?Component Value Date  ? HGBA1C 6.0 09/09/2021  ? ?ASSESSMENT AND PLAN: ? ?Discussed the following assessment and plan: ? ?#1 uncontrolled hypertension. ?Increase losartan to 100 mg a day. ?Continue Toprol-XL 100 mg a day. ?Lab visit at earliest convenience  for electrolytes and creatinine. ? ?2.  Hyperlipidemia.  She has preferred to not be on a statin. ?Lipid panel and hepatic panel today ordered--future. ?Dr. Einar Gip, her cardiologist, has recommended coronary

## 2022-06-03 ENCOUNTER — Ambulatory Visit (INDEPENDENT_AMBULATORY_CARE_PROVIDER_SITE_OTHER): Payer: Medicare HMO | Admitting: Family Medicine

## 2022-06-03 ENCOUNTER — Encounter: Payer: Self-pay | Admitting: Family Medicine

## 2022-06-03 VITALS — BP 118/84 | HR 81 | Temp 97.6°F | Ht 65.0 in | Wt 163.8 lb

## 2022-06-03 DIAGNOSIS — R7303 Prediabetes: Secondary | ICD-10-CM | POA: Diagnosis not present

## 2022-06-03 DIAGNOSIS — E7800 Pure hypercholesterolemia, unspecified: Secondary | ICD-10-CM | POA: Diagnosis not present

## 2022-06-03 DIAGNOSIS — I10 Essential (primary) hypertension: Secondary | ICD-10-CM | POA: Diagnosis not present

## 2022-06-03 DIAGNOSIS — I5189 Other ill-defined heart diseases: Secondary | ICD-10-CM | POA: Diagnosis not present

## 2022-06-03 DIAGNOSIS — L989 Disorder of the skin and subcutaneous tissue, unspecified: Secondary | ICD-10-CM | POA: Diagnosis not present

## 2022-06-03 LAB — LIPID PANEL
Cholesterol: 194 mg/dL (ref 0–200)
HDL: 36.7 mg/dL — ABNORMAL LOW (ref >39.00)
LDL Cholesterol: 121 mg/dL — ABNORMAL HIGH (ref 0–99)
NonHDL: 157.67
Total CHOL/HDL Ratio: 5
Triglycerides: 184 mg/dL — ABNORMAL HIGH (ref 0.0–149.0)
VLDL: 36.8 mg/dL (ref 0.0–40.0)

## 2022-06-03 LAB — COMPREHENSIVE METABOLIC PANEL
ALT: 13 U/L (ref 0–35)
AST: 11 U/L (ref 0–37)
Albumin: 4.2 g/dL (ref 3.5–5.2)
Alkaline Phosphatase: 137 U/L — ABNORMAL HIGH (ref 39–117)
BUN: 14 mg/dL (ref 6–23)
CO2: 26 mEq/L (ref 19–32)
Calcium: 9.9 mg/dL (ref 8.4–10.5)
Chloride: 106 mEq/L (ref 96–112)
Creatinine, Ser: 1.13 mg/dL (ref 0.40–1.20)
GFR: 55.3 mL/min — ABNORMAL LOW (ref >60.00)
Glucose, Bld: 107 mg/dL — ABNORMAL HIGH (ref 70–99)
Potassium: 4 mEq/L (ref 3.5–5.1)
Sodium: 140 mEq/L (ref 135–145)
Total Bilirubin: 0.4 mg/dL (ref 0.2–1.2)
Total Protein: 6.9 g/dL (ref 6.0–8.3)

## 2022-06-03 LAB — HEMOGLOBIN A1C: Hgb A1c MFr Bld: 5.8 % (ref 4.6–6.5)

## 2022-06-03 MED ORDER — FUROSEMIDE 20 MG PO TABS: ORAL_TABLET | ORAL | 1 refills | Status: DC

## 2022-06-03 NOTE — Progress Notes (Addendum)
OFFICE VISIT  06/03/2022  CC:  Chief Complaint  Patient presents with   Hypertension   Patient is a 56 y.o. female who presents for 3-month follow-up hypertension. A/P as of last visit: "#1 uncontrolled hypertension. Increase losartan to 100 mg a day. Continue Toprol-XL 100 mg a day. Lab visit at earliest convenience for electrolytes and creatinine.  2.  Hyperlipidemia.  She has preferred to not be on a statin. Lipid panel and hepatic panel today ordered--future. Dr. Jacinto Halim, her cardiologist, has recommended coronary calcium score for further risk stratification but she has declined in the past.  3. diastolic dysfunction. Needs better blood pressure control--see #1 above. Using Lasix on a as needed basis and does well with this. I encouraged her to follow-up with him at her earliest convenience. He also follows her for mild to moderate mitral regurgitation.   #4 prediabetes. Most recent A1c was 6% about 6 months ago. Hemoglobin A1c ordered--future"  INTERIM HX: Her most recent visit was a virtual visit with me, was supposed to return for her lab work but did not.  Home bp's 130/80 avg.    She feels pretty well overall.  Pretty stressed with taking care of her mother with dementia and her son has had some recurrent illnesses lately, most recently mono.  Of note: Her Soma and Ambien are prescribed by a different provider.  ROS as above, plus--> no fevers, no CP, no SOB, no wheezing, no cough, no dizziness, no HAs, no rashes, no melena/hematochezia.  No polyuria or polydipsia.  No focal weakness, paresthesias, or tremors.  No acute vision or hearing abnormalities.  No dysuria or unusual/new urinary urgency or frequency.  No recent changes in lower legs. No n/v/d or abd pain.  No palpitations.    Past Medical History:  Diagnosis Date   Allergic rhinitis    Cervical spondylosis 08/2020   C8 radiculopathy->Surgery 08/2020 (Dr. Yetta Barre)   Chronic renal insufficiency, stage 3  (moderate) (HCC)    GFR 50s   COVID-19    12/2020   Diastolic dysfunction 09/2020 echo   grade II   Fibromyalgia    Heart defect    PFO--closure procedure approx 2009 (Dr. Jacinto Halim)   Hypercholesterolemia 08/2021   mild, frmhm cv risk 3.5%->[facility]   Hypertension    Insomnia    Migraine syndrome    Mild mitral valve regurgitation 09/17/2020   09/2020 echo-->mild/mod MVR   prediab 08/2019   2020 Gluc 106->A1c 5.6%.  08/2021 Hba1c 6%.   Scoliosis     Past Surgical History:  Procedure Laterality Date   APPENDECTOMY  6th grade   CARDIAC SURGERY     PFO closure 2009   CARDIOVASCULAR STRESS TEST  10/10/2020   chronotropic incompetence.  No ischemia, normal LV fxn, normal wall motion, EF 70%.   CERVICAL SPINE SURGERY  remote past; also 08/2020   discectomy x 1, then discectomy with titanium fusion. 08/2020 C spine surg again (Dr. Yetta Barre)   CHOLECYSTECTOMY  prior to 2010   COLONOSCOPY     "1990s" --reason unknown   FOOT SURGERY  approx 1990   benign mass removed from bottom of foot   PFTs  01/13/2019   Minimal obstructive airway dz; no response to bronchodilator   TRANSTHORACIC ECHOCARDIOGRAM  09/17/2020   EF 60-65%, grd II DD, mild/mod MVR    Outpatient Medications Prior to Visit  Medication Sig Dispense Refill   carisoprodol (SOMA) 350 MG tablet Take 350 mg by mouth 4 (four) times daily.  cetirizine (ZYRTEC) 10 MG tablet Take 10 mg by mouth daily.     eletriptan (RELPAX) 40 MG tablet Take by mouth.     fluticasone (FLONASE) 50 MCG/ACT nasal spray Place into both nostrils daily.     furosemide (LASIX) 20 MG tablet TAKE 1 TABLET(20 MG) BY MOUTH DAILY 90 tablet 1   losartan (COZAAR) 100 MG tablet Take 1 tablet (100 mg total) by mouth daily. 90 tablet 1   magnesium gluconate (MAGONATE) 500 MG tablet Take 500 mg by mouth 2 (two) times daily.     Melatonin 5 MG TABS Take 10 mg by mouth daily as needed.      metoprolol succinate (TOPROL-XL) 100 MG 24 hr tablet Take with or immediately  following a meal. 90 tablet 1   Milnacipran HCl (SAVELLA PO) Take by mouth 2 (two) times daily.     naratriptan (AMERGE) 2.5 MG tablet Take 2.5 mg by mouth as needed for migraine. Take one (1) tablet at onset of headache; if returns or does not resolve, may repeat after 4 hours; do not exceed five (5) mg in 24 hours.      pantoprazole (PROTONIX) 40 MG tablet 1 tab po qd 90 tablet 1   zolmitriptan (ZOMIG) 5 MG tablet Take by mouth.     zolpidem (AMBIEN CR) 12.5 MG CR tablet Take 12.5 mg by mouth at bedtime as needed for sleep.     zonisamide (ZONEGRAN) 100 MG capsule Take by mouth.     potassium chloride SA (KLOR-CON) 20 MEQ tablet Take 1 tablet (20 mEq total) by mouth daily. 30 tablet 5   No facility-administered medications prior to visit.    No Known Allergies  ROS As per HPI  PE:    06/03/2022    9:43 AM 03/10/2022   10:29 AM 03/04/2022    6:28 PM  Vitals with BMI  Height 5\' 5"     Weight 163 lbs 13 oz 160 lbs   BMI 27.26    Systolic 118 148 [id number]  Diastolic 84 89 89  Pulse 81 85 116     Physical Exam  Gen: Alert, well appearing.  Patient is oriented to person, place, time, and situation. AFFECT: pleasant, lucid thought and speech. CV: RRR, no m/r/g.   LUNGS: CTA bilat, nonlabored resps, good aeration in all lung fields. EXT: no clubbing or cyanosis.  Trace bilat LL pitting edema.  Skin: On L flank she has an 1 cm oval skin lesion with mildly nodular raised borders and central ulceration.  LABS:  Last CBC Lab Results  Component Value Date   WBC 7.5 09/09/2021   HGB 14.0 09/09/2021   HCT 40.6 09/09/2021   MCV 92.2 09/09/2021   MCH 31.6 05/28/2020   RDW 12.6 09/09/2021   PLT 348.0 09/09/2021   Last metabolic panel Lab Results  Component Value Date   GLUCOSE 99 09/09/2021   NA 139 09/09/2021   K 4.1 09/09/2021   CL 104 09/09/2021   CO2 25 09/09/2021   BUN 16 09/09/2021   CREATININE 1.05 09/09/2021   GFRNONAA >60 05/28/2020   CALCIUM 9.7 09/09/2021   PROT 7.2  09/09/2021   ALBUMIN 4.3 09/09/2021   BILITOT 0.4 09/09/2021   ALKPHOS 122 (H) 09/09/2021   AST 14 09/09/2021   ALT 17 09/09/2021   ANIONGAP 9 05/28/2020   Last lipids Lab Results  Component Value Date   CHOL 213 (H) 09/09/2021   HDL 38.50 (L) 09/09/2021   LDLCALC 141 (H) 09/09/2021  LDLDIRECT 117 (H) 09/28/2020   TRIG 168.0 (H) 09/09/2021   CHOLHDL 6 09/09/2021   Last hemoglobin A1c Lab Results  Component Value Date   HGBA1C 6.0 09/09/2021   Last thyroid functions Lab Results  Component Value Date   TSH 2.30 09/09/2021   IMPRESSION AND PLAN:  #1 hypertension, well controlled on losartan 100 mg a day and Toprol-XL 100 mg a day.. Electrolytes and creatinine today.  2. Hyperlipidemia.  She has preferred to not be on a statin. Lipid panel today.  #3 prediabetes. Most recent A1c was 6% about 8 months ago. Hemoglobin A1c ordered today.  #4 diastolic dysfunction and mild to moderate mitral insufficiency. Fluid balance looks good.  Blood pressure control good She uses Lasix as needed. Encouraged patient to get routine follow-up with Dr. Jacinto Halim.  #5 skin lesion left flank. Suspicious for BCC or SCC. Referred to Derm.  An After Visit Summary was printed and given to the patient.  FOLLOW UP: No follow-ups on file.  Signed:  Santiago Bumpers, MD           06/03/2022

## 2022-06-04 ENCOUNTER — Telehealth: Payer: Self-pay

## 2022-06-04 NOTE — Telephone Encounter (Signed)
Patient returning call about lab results.  Please call back when available.

## 2022-06-04 NOTE — Telephone Encounter (Signed)
Spoke with patient regarding results/recommendations.   Pt stated that she spoke to PCP about a referral to derm. Please advise

## 2022-06-05 NOTE — Addendum Note (Signed)
Addended by: Jeoffrey Massed on: 06/05/2022 07:40 AM   Modules accepted: Orders

## 2022-06-05 NOTE — Telephone Encounter (Signed)
Yes, she is right.  I entered that referral order today.

## 2022-06-06 NOTE — Telephone Encounter (Signed)
Pt advised of referral to [facility]. Once their office has received all necessary information, they will follow up with pt to schedule.

## 2022-07-15 ENCOUNTER — Telehealth: Payer: Self-pay | Admitting: Family Medicine

## 2022-07-15 NOTE — Telephone Encounter (Signed)
Left message for patient to schedule Annual Wellness Visit.  Please schedule (telephone/video call) with Nurse Health Advisor Tina Betterson, RN at [facility]. Please call [phone] ask for Kathy 

## 2022-08-06 ENCOUNTER — Ambulatory Visit: Payer: Medicare HMO

## 2022-08-06 NOTE — Progress Notes (Unsigned)
Subjective:   Courtney Haynes is a 56 y.o. female who presents for Medicare Annual (Subsequent) preventive examination.  Review of Systems    Defer to PCP       Objective:    There were no vitals filed for this visit. There is no height or weight on file to calculate BMI.     07/10/2021    3:58 PM 05/28/2020    2:11 PM 11/11/2018    7:21 PM  Advanced Directives  Does Patient Have a Medical Advance Directive? No No No  Would patient like information on creating a medical advance directive? No - Patient declined  No - Patient declined    Current Medications (verified) Outpatient Encounter Medications as of 08/06/2022  Medication Sig   carisoprodol (SOMA) 350 MG tablet Take 350 mg by mouth 4 (four) times daily.   cetirizine (ZYRTEC) 10 MG tablet Take 10 mg by mouth daily.   eletriptan (RELPAX) 40 MG tablet Take by mouth.   fluticasone (FLONASE) 50 MCG/ACT nasal spray Place into both nostrils daily.   furosemide (LASIX) 20 MG tablet TAKE 1 TABLET(20 MG) BY MOUTH DAILY   losartan (COZAAR) 100 MG tablet Take 1 tablet (100 mg total) by mouth daily.   magnesium gluconate (MAGONATE) 500 MG tablet Take 500 mg by mouth 2 (two) times daily.   Melatonin 5 MG TABS Take 10 mg by mouth daily as needed.    metoprolol succinate (TOPROL-XL) 100 MG 24 hr tablet Take with or immediately following a meal.   Milnacipran HCl (SAVELLA PO) Take by mouth 2 (two) times daily.   naratriptan (AMERGE) 2.5 MG tablet Take 2.5 mg by mouth as needed for migraine. Take one (1) tablet at onset of headache; if returns or does not resolve, may repeat after 4 hours; do not exceed five (5) mg in 24 hours.    pantoprazole (PROTONIX) 40 MG tablet 1 tab po qd   zolmitriptan (ZOMIG) 5 MG tablet Take by mouth.   zolpidem (AMBIEN CR) 12.5 MG CR tablet Take 12.5 mg by mouth at bedtime as needed for sleep.   zonisamide (ZONEGRAN) 100 MG capsule Take by mouth.   No facility-administered encounter medications on file as of  08/06/2022.    Allergies (verified) Patient has no known allergies.   History: Past Medical History:  Diagnosis Date   Allergic rhinitis    Cervical spondylosis 08/2020   C8 radiculopathy->Surgery 08/2020 (Dr. Yetta Barre)   Chronic renal insufficiency, stage 3 (moderate) (HCC)    GFR 50s   COVID-19    12/2020   Diastolic dysfunction 09/2020 echo   grade II   Fibromyalgia    Heart defect    PFO--closure procedure approx 2009 (Dr. Jacinto Halim)   Hypercholesterolemia 08/2021   mild, frmhm cv risk 3.5%->TLC   Hypertension    Insomnia    Migraine syndrome    Mild mitral valve regurgitation 09/17/2020   09/2020 echo-->mild/mod MVR   prediab 08/2019   2020 Gluc 106->A1c 5.6%.  08/2021 Hba1c 6%.   Scoliosis    Past Surgical History:  Procedure Laterality Date   APPENDECTOMY  6th grade   CARDIAC SURGERY     PFO closure 2009   CARDIOVASCULAR STRESS TEST  10/10/2020   chronotropic incompetence.  No ischemia, normal LV fxn, normal wall motion, EF 70%.   CERVICAL SPINE SURGERY  remote past; also 08/2020   discectomy x 1, then discectomy with titanium fusion. 08/2020 C spine surg again (Dr. Yetta Barre)   CHOLECYSTECTOMY  prior  to 2010   COLONOSCOPY     "1990s" --reason unknown   FOOT SURGERY  approx 1990   benign mass removed from bottom of foot   PFTs  01/13/2019   Minimal obstructive airway dz; no response to bronchodilator   TRANSTHORACIC ECHOCARDIOGRAM  09/17/2020   EF 60-65%, grd II DD, mild/mod MVR   Family History  Problem Relation Age of Onset   Heart disease Father    Diabetes Father    Kidney disease Father    Stroke Father    Heart disease Sister    Social History   Socioeconomic History   Marital status: Divorced    Spouse name: Not on file   Number of children: Not on file   Years of education: Not on file   Highest education level: Bachelor's degree (e.g., BA, AB, BS)  Occupational History   Not on file  Tobacco Use   Smoking status: Never   Smokeless tobacco: Never   Substance and Sexual Activity   Alcohol use: No   Drug use: No   Sexual activity: Not on file  Other Topics Concern   Not on file  Social History Narrative   Widow, has 1 son named Hydrologist.   Educ: "4 yr degree"   Former occup: Insurance claims handler.  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     Denies tob or alc or street drug use.   Says she was once on oxycontin for fibromyalgia pain but this was in the far remote past.   Social Determinants of Health   Financial Resource Strain: High Risk (08/06/2022)   Overall Financial Resource Strain (CARDIA)    Difficulty of Paying Living Expenses: Very hard  Food Insecurity: Unknown (08/06/2022)   Hunger Vital Sign    Worried About Running Out of Food in the Last Year: Patient refused    [place] in the Last Year: Patient refused  Transportation Needs: Unknown (08/06/2022)   PRAPARE - Hydrologist (Medical): No    Lack of Transportation (Non-Medical): Patient refused  Physical Activity: Unknown (08/06/2022)   Exercise Vital Sign    Days of Exercise per Week: Patient refused    Minutes of Exercise per Session: 10 min  Stress: Stress Concern Present (06/02/2022)   [place]    Feeling of Stress : To some extent  Social Connections: Unknown (06/02/2022)   Social Connection and Isolation Panel [NHANES]    Frequency of Communication with Friends and Family: More than three times a week    Frequency of Social Gatherings with Friends and Family: Once a week    Attends Religious Services: More than 4 times per year    Active Member of [employer] or Organizations: Yes    Attends Archivist Meetings: Never    Marital Status: Patient refused    Tobacco Counseling Counseling given: Not Answered   Clinical Intake:                 Diabetic?***         Activities of Daily Living     No data to  display          Patient Care Team: Tammi Sou, MD as PCP - General (Family Medicine) Blanch Media, MD as Consulting Physician (Neurology) Eustace Moore, MD as Consulting Physician (Neurosurgery) Adrian Prows, MD as Consulting Physician (Cardiology)  Indicate any recent Medical Services  you may have received from other than [facility] providers in the past year (date may be approximate).     Assessment:   This is a routine wellness examination for Courtney Haynes.  Hearing/Vision screen No results found.  Dietary issues and exercise activities discussed:     Goals Addressed   None   Depression Screen    06/03/2022    9:45 AM 09/09/2021   11:33 AM 07/10/2021    4:15 PM 05/31/2020    2:09 PM 04/28/2019    9:57 AM 12/02/2018    1:18 PM  PHQ 2/9 Scores  PHQ - 2 Score 4 0 0 0 0 0    Fall Risk    06/03/2022    9:44 AM 07/10/2021    4:01 PM  Fall Risk   Falls in the past year? 1 0  Number falls in past yr: 0 0  Injury with Fall? 1 0  Risk for fall due to : Impaired vision   Follow up Falls evaluation completed Falls evaluation completed;Falls prevention discussed    FALL RISK PREVENTION PERTAINING TO THE HOME:  Any stairs in or around the home? {YES/NO:21197} If so, are there any without handrails? {YES/NO:21197} Home free of loose throw rugs in walkways, pet beds, electrical cords, etc? {YES/NO:21197} Adequate lighting in your home to reduce risk of falls? {YES/NO:21197}  ASSISTIVE DEVICES UTILIZED TO PREVENT FALLS:  Life alert? {YES/NO:21197} Use of a cane, walker or w/c? {YES/NO:21197} Grab bars in the bathroom? {YES/NO:21197} Shower chair or bench in shower? {YES/NO:21197} Elevated toilet seat or a handicapped toilet? {YES/NO:21197}  TIMED UP AND GO:  Was the test performed? {YES/NO:21197}.  Length of time to ambulate 10 feet: *** sec.   {Appearance of QBHA:1937902}  Cognitive Function:        Immunizations Immunization History  Administered Date(s)  Administered   Influenza,inj,Quad PF,6+ Mos 08/11/2019    {TDAP status:2101805}  {Flu Vaccine status:2101806}  {Pneumococcal vaccine status:2101807}  {Covid-19 vaccine status:2101808}  Qualifies for Shingles Vaccine? {YES/NO:21197}  Zostavax completed {YES/NO:21197}  {Shingrix Completed?:2101804}  Screening Tests Health Maintenance  Topic Date Due   COVID-19 Vaccine (1) Never done   HIV Screening  Never done   Hepatitis C Screening  Never done   COLONOSCOPY (Pts 45-110yrs Insurance coverage will need to be confirmed)  Never done   MAMMOGRAM  Never done   Zoster Vaccines- Shingrix (1 of 2) Never done   PAP SMEAR-Modifier  03/10/2020   INFLUENZA VACCINE  07/08/2022   TETANUS/TDAP  09/09/2022 (Originally 04/13/1985)   HPV VACCINES  Aged Out    Health Maintenance  Health Maintenance Due  Topic Date Due   COVID-19 Vaccine (1) Never done   HIV Screening  Never done   Hepatitis C Screening  Never done   COLONOSCOPY (Pts 45-37yrs Insurance coverage will need to be confirmed)  Never done   MAMMOGRAM  Never done   Zoster Vaccines- Shingrix (1 of 2) Never done   PAP SMEAR-Modifier  03/10/2020   INFLUENZA VACCINE  07/08/2022    {Colorectal cancer screening:2101809}  {Mammogram status:21018020}  {Bone Density status:21018021}  Lung Cancer Screening: (Low Dose CT Chest recommended if Age 15-80 years, 30 pack-year currently smoking OR have quit w/in 15years.) {DOES NOT does:27190::"does not"} qualify.   Lung Cancer Screening Referral: ***  Additional Screening:  Hepatitis C Screening: {DOES NOT does:27190::"does not"} qualify; Completed ***  Vision Screening: Recommended annual ophthalmology exams for early detection of glaucoma and other disorders of the eye. Is the patient up to  date with their annual eye exam?  {YES/NO:21197} Who is the provider or what is the name of the office in which the patient attends annual eye exams? *** If pt is not established with a provider,  would they like to be referred to a provider to establish care? {YES/NO:21197}.   Dental Screening: Recommended annual dental exams for proper oral hygiene  Community Resource Referral / Chronic Care Management: CRR required this visit?  {YES/NO:21197}  CCM required this visit?  {YES/NO:21197}     Plan:     I have personally reviewed and noted the following in the patient's chart:   Medical and social history Use of alcohol, tobacco or illicit drugs  Current medications and supplements including opioid prescriptions. {Opioid Prescriptions:[phone]} Functional ability and status Nutritional status Physical activity Advanced directives List of other physicians Hospitalizations, surgeries, and ER visits in previous 12 months Vitals Screenings to include cognitive, depression, and falls Referrals and appointments  In addition, I have reviewed and discussed with patient certain preventive protocols, quality metrics, and best practice recommendations. A written personalized care plan for preventive services as well as general preventive health recommendations were provided to patient.     Octaviano Glow, CMA   08/06/2022   Nurse Notes: ***

## 2022-08-07 ENCOUNTER — Telehealth: Payer: Self-pay

## 2022-08-07 NOTE — Progress Notes (Signed)
Pt did not answer.

## 2022-08-07 NOTE — Telephone Encounter (Signed)
Called pt to schedule AWV. Please schedule with health coach. 

## 2022-08-07 NOTE — Addendum Note (Signed)
Addended by: Filomena Jungling on: 08/07/2022 01:21 PM   Modules accepted: Level of Service

## 2022-08-13 ENCOUNTER — Ambulatory Visit (INDEPENDENT_AMBULATORY_CARE_PROVIDER_SITE_OTHER): Payer: Medicare HMO

## 2022-08-13 DIAGNOSIS — Z1231 Encounter for screening mammogram for malignant neoplasm of breast: Secondary | ICD-10-CM

## 2022-08-13 DIAGNOSIS — Z13820 Encounter for screening for osteoporosis: Secondary | ICD-10-CM

## 2022-08-13 DIAGNOSIS — Z1211 Encounter for screening for malignant neoplasm of colon: Secondary | ICD-10-CM | POA: Diagnosis not present

## 2022-08-13 DIAGNOSIS — Z0000 Encounter for general adult medical examination without abnormal findings: Secondary | ICD-10-CM | POA: Diagnosis not present

## 2022-08-13 MED ORDER — TETANUS-DIPHTH-ACELL PERTUSSIS 5-2.5-18.5 LF-MCG/0.5 IM SUSP: 0.5000 mL | Freq: Once | INTRAMUSCULAR | 0 refills | Status: AC

## 2022-08-13 MED ORDER — ZOSTER VAC RECOMB ADJUVANTED 50 MCG/0.5ML IM SUSR: 0.5000 mL | Freq: Once | INTRAMUSCULAR | 0 refills | Status: AC

## 2022-08-13 NOTE — Patient Instructions (Signed)

## 2022-08-13 NOTE — Progress Notes (Signed)
Subjective:   Courtney Haynes is a 56 y.o. female who presents for Medicare Annual (Subsequent) preventive examination.  I connected with  Courtney Haynes on 08/13/22 by an audio only telemedicine application and verified that I am speaking with the correct person using two identifiers.   I discussed the limitations, risks, security and privacy concerns of performing an evaluation and management service by telephone and the availability of in person appointments. I also discussed with the patient that there may be a patient responsible charge related to this service. The patient expressed understanding and verbally consented to this telephonic visit.  Location of Patient: home Location of Provider:office  List any persons and their role that are participating in the visit with the patient.   Tiffinie Mattson Rocky Crafts, CMA  Review of Systems    Defer to PCP Cardiac Risk Factors include: advanced age (>33men, >28 women)     Objective:    There were no vitals filed for this visit. There is no height or weight on file to calculate BMI.     08/13/2022   10:55 AM 07/10/2021    3:58 PM 05/28/2020    2:11 PM 11/11/2018    7:21 PM  Advanced Directives  Does Patient Have a Medical Advance Directive? No No No No  Would patient like information on creating a medical advance directive?  No - Patient declined  No - Patient declined    Current Medications (verified) Outpatient Encounter Medications as of 08/13/2022  Medication Sig   Tdap (BOOSTRIX) 5-2.5-18.5 LF-MCG/0.5 injection Inject 0.5 mLs into the muscle once for 1 dose.   Zoster Vaccine Adjuvanted [facility]) injection Inject 0.5 mLs into the muscle once for 1 dose.   carisoprodol (SOMA) 350 MG tablet Take 350 mg by mouth 4 (four) times daily.   cetirizine (ZYRTEC) 10 MG tablet Take 10 mg by mouth daily.   eletriptan (RELPAX) 40 MG tablet Take by mouth.   fluticasone (FLONASE) 50 MCG/ACT nasal spray Place into both nostrils daily.    furosemide (LASIX) 20 MG tablet TAKE 1 TABLET(20 MG) BY MOUTH DAILY   losartan (COZAAR) 100 MG tablet Take 1 tablet (100 mg total) by mouth daily.   magnesium gluconate (MAGONATE) 500 MG tablet Take 500 mg by mouth 2 (two) times daily.   Melatonin 5 MG TABS Take 10 mg by mouth daily as needed.    metoprolol succinate (TOPROL-XL) 100 MG 24 hr tablet Take with or immediately following a meal.   Milnacipran HCl (SAVELLA PO) Take by mouth 2 (two) times daily.   naratriptan (AMERGE) 2.5 MG tablet Take 2.5 mg by mouth as needed for migraine. Take one (1) tablet at onset of headache; if returns or does not resolve, may repeat after 4 hours; do not exceed five (5) mg in 24 hours.    pantoprazole (PROTONIX) 40 MG tablet 1 tab po qd   zolmitriptan (ZOMIG) 5 MG tablet Take by mouth.   zolpidem (AMBIEN CR) 12.5 MG CR tablet Take 12.5 mg by mouth at bedtime as needed for sleep.   zonisamide (ZONEGRAN) 100 MG capsule Take by mouth.   No facility-administered encounter medications on file as of 08/13/2022.    Allergies (verified) Patient has no known allergies.   History: Past Medical History:  Diagnosis Date   Allergic rhinitis    Cervical spondylosis 08/2020   C8 radiculopathy->Surgery 08/2020 (Dr. Yetta Barre)   Chronic renal insufficiency, stage 3 (moderate) (HCC)    GFR 50s   COVID-19  12/2020   Diastolic dysfunction 09/2020 echo   grade II   Fibromyalgia    Heart defect    PFO--closure procedure approx 2009 (Dr. Jacinto Halim)   Hypercholesterolemia 08/2021   mild, frmhm cv risk 3.5%->TLC   Hypertension    Insomnia    Migraine syndrome    Mild mitral valve regurgitation 09/17/2020   09/2020 echo-->mild/mod MVR   prediab 08/2019   2020 Gluc 106->A1c 5.6%.  08/2021 Hba1c 6%.   Scoliosis    Past Surgical History:  Procedure Laterality Date   APPENDECTOMY  6th grade   CARDIAC SURGERY     PFO closure 2009   CARDIOVASCULAR STRESS TEST  10/10/2020   chronotropic incompetence.  No ischemia, normal LV  fxn, normal wall motion, EF 70%.   CERVICAL SPINE SURGERY  remote past; also 08/2020   discectomy x 1, then discectomy with titanium fusion. 08/2020 C spine surg again (Dr. Yetta Barre)   CHOLECYSTECTOMY  prior to 2010   COLONOSCOPY     "1990s" --reason unknown   FOOT SURGERY  approx 1990   benign mass removed from bottom of foot   PFTs  01/13/2019   Minimal obstructive airway dz; no response to bronchodilator   TRANSTHORACIC ECHOCARDIOGRAM  09/17/2020   EF 60-65%, grd II DD, mild/mod MVR   Family History  Problem Relation Age of Onset   Heart disease Father    Diabetes Father    Kidney disease Father    Stroke Father    Heart disease Sister    Social History   Socioeconomic History   Marital status: Divorced    Spouse name: Not on file   Number of children: Not on file   Years of education: Not on file   Highest education level: Bachelor's degree (e.g., BA, AB, BS)  Occupational History   Not on file  Tobacco Use   Smoking status: Never   Smokeless tobacco: Never  Substance and Sexual Activity   Alcohol use: No   Drug use: No   Sexual activity: Not on file  Other Topics Concern   Not on file  Social History Narrative   Widow, has 1 son named Courtney Haynes, systems.   Educ: "4 yr degree"   Former occup: Architect.  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     Denies tob or alc or street drug use.   Says she was once on oxycontin for fibromyalgia pain but this was in the far remote past.   Social Determinants of Health   Financial Resource Strain: Low Risk  (08/13/2022)   Overall Financial Resource Strain (CARDIA)    Difficulty of Paying Living Expenses: Not very hard  Recent Concern: Financial Resource Strain - High Risk (08/06/2022)   Overall Financial Resource Strain (CARDIA)    Difficulty of Paying Living Expenses: Very hard  Food Insecurity: No Food Insecurity (08/13/2022)   Hunger Vital Sign    Worried About Running Out of Food in the Last Year:  Never true    Ran Out of Food in the Last Year: Never true  Transportation Needs: No Transportation Needs (08/13/2022)   PRAPARE - Administrator, Civil Service (Medical): No    Lack of Transportation (Non-Medical): No  Physical Activity: Unknown (08/06/2022)   Exercise Vital Sign    Days of Exercise per Week: Patient refused    Minutes of Exercise per Session: 10 min  Stress: Stress Concern Present (08/13/2022)   [employer] of Occupational Health - Occupational  Stress Questionnaire    Feeling of Stress : To some extent  Social Connections: Moderately Integrated (08/13/2022)   Social Connection and Isolation Panel [NHANES]    Frequency of Communication with Friends and Family: More than three times a week    Frequency of Social Gatherings with Friends and Family: Once a week    Attends Religious Services: More than 4 times per year    Active Member of [employer] or Organizations: Yes    Attends Archivist Meetings: Never    Marital Status: Divorced    Tobacco Counseling Counseling given: Not Answered   Clinical Intake:  Pre-visit preparation completed: No  Pain : No/denies pain     Nutritional Risks: None Diabetes: No  How often do you need to have someone help you when you read instructions, pamphlets, or other written materials from your doctor or pharmacy?: 1 - Never  Diabetic?no  Interpreter Needed?: No      Activities of Daily Living    08/13/2022   10:55 AM  In your present state of health, do you have any difficulty performing the following activities:  Hearing? 1  Vision? 0  Difficulty concentrating or making decisions? 0  Walking or climbing stairs? 0  Dressing or bathing? 0  Doing errands, shopping? 0  Preparing Food and eating ? N  Using the Toilet? N  In the past six months, have you accidently leaked urine? N  Do you have problems with loss of bowel control? N  Managing your Medications? N  Managing your Finances? N   Housekeeping or managing your Housekeeping? N    Patient Care Team: Tammi Sou, MD as PCP - General (Family Medicine) Blanch Media, MD as Consulting Physician (Neurology) Eustace Moore, MD as Consulting Physician (Neurosurgery) Adrian Prows, MD as Consulting Physician (Cardiology)  Indicate any recent [place] you may have received from other than [facility] providers in the past year (date may be approximate).     Assessment:   This is a routine wellness examination for Celestina.  Hearing/Vision screen No results found.  Dietary issues and exercise activities discussed: Current Exercise Habits: Home exercise routine, Type of exercise: walking, Time (Minutes): 30, Frequency (Times/Week): 7, Weekly Exercise (Minutes/Week): 210, Intensity: Mild   Goals Addressed   None   Depression Screen    08/13/2022   10:55 AM 06/03/2022    9:45 AM 09/09/2021   11:33 AM 07/10/2021    4:15 PM 05/31/2020    2:09 PM 04/28/2019    9:57 AM 12/02/2018    1:18 PM  PHQ 2/9 Scores  PHQ - 2 Score 0 4 0 0 0 0 0    Fall Risk    08/13/2022   10:55 AM 06/03/2022    9:44 AM 07/10/2021    4:01 PM  Fall Risk   Falls in the past year? 0 1 0  Number falls in past yr: 0 0 0  Injury with Fall? 0 1 0  Risk for fall due to : History of fall(s) Impaired vision   Follow up Falls evaluation completed Falls evaluation completed Falls evaluation completed;Falls prevention discussed    FALL RISK PREVENTION PERTAINING TO THE HOME:  Any stairs in or around the home? Yes  If so, are there any without handrails? Yes  Home free of loose throw rugs in walkways, pet beds, electrical cords, etc? Yes  Adequate lighting in your home to reduce risk of falls? Yes   ASSISTIVE DEVICES UTILIZED TO PREVENT FALLS:  Life alert? No  Use of a cane, walker or w/c? No  Grab bars in the bathroom? Yes  Shower chair or bench in shower? No  Elevated toilet seat or a handicapped toilet? No   TIMED UP AND GO:  Was the  test performed? No .  Length of time to ambulate 10 feet: n/a sec.     Cognitive Function:        08/13/2022   11:08 AM  6CIT Screen  What Year? 0 points  What month? 0 points  What time? 0 points  Count back from 20 0 points  Repeat phrase 0 points    Immunizations Immunization History  Administered Date(s) Administered   Influenza,inj,Quad PF,6+ Mos 08/11/2019    TDAP status: Due, Education has been provided regarding the importance of this vaccine. Advised may receive this vaccine at local pharmacy or Health Dept. Aware to provide a copy of the vaccination record if obtained from local pharmacy or Health Dept. Verbalized acceptance and understanding.  Flu Vaccine status: Due, Education has been provided regarding the importance of this vaccine. Advised may receive this vaccine at local pharmacy or Health Dept. Aware to provide a copy of the vaccination record if obtained from local pharmacy or Health Dept. Verbalized acceptance and understanding.  Pneumococcal vaccine status: Due, Education has been provided regarding the importance of this vaccine. Advised may receive this vaccine at local pharmacy or Health Dept. Aware to provide a copy of the vaccination record if obtained from local pharmacy or Health Dept. Verbalized acceptance and understanding.  Covid-19 vaccine status: Declined, Education has been provided regarding the importance of this vaccine but patient still declined. Advised may receive this vaccine at local pharmacy or Health Dept.or vaccine clinic. Aware to provide a copy of the vaccination record if obtained from local pharmacy or Health Dept. Verbalized acceptance and understanding.  Qualifies for Shingles Vaccine? Yes   Zostavax completed No   Shingrix Completed?: No.    Education has been provided regarding the importance of this vaccine. Patient has been advised to call insurance company to determine out of pocket expense if they have not yet received this  vaccine. Advised may also receive vaccine at local pharmacy or Health Dept. Verbalized acceptance and understanding.  Screening Tests Health Maintenance  Topic Date Due   COVID-19 Vaccine (1) Never done   HIV Screening  Never done   Hepatitis C Screening  Never done   COLONOSCOPY (Pts 45-64yrs Insurance coverage will need to be confirmed)  Never done   MAMMOGRAM  Never done   Zoster Vaccines- Shingrix (1 of 2) Never done   PAP SMEAR-Modifier  03/10/2020   INFLUENZA VACCINE  07/08/2022   TETANUS/TDAP  09/09/2022 (Originally 04/13/1985)   HPV VACCINES  Aged Out    Health Maintenance  Health Maintenance Due  Topic Date Due   COVID-19 Vaccine (1) Never done   HIV Screening  Never done   Hepatitis C Screening  Never done   COLONOSCOPY (Pts 45-38yrs Insurance coverage will need to be confirmed)  Never done   MAMMOGRAM  Never done   Zoster Vaccines- Shingrix (1 of 2) Never done   PAP SMEAR-Modifier  03/10/2020   INFLUENZA VACCINE  07/08/2022    Colorectal cancer screening: Referral to GI placed n/a. Pt aware the office will call re: appt. Cologuard ordered  Mammogram status: Ordered 08/13/22. Pt provided with contact info and advised to call to schedule appt.   Bone Density status: Ordered 08/13/22. Pt provided with  contact info and advised to call to schedule appt.  Lung Cancer Screening: (Low Dose CT Chest recommended if Age 68-80 years, 30 pack-year currently smoking OR have quit w/in 15years.) does not qualify.   Lung Cancer Screening Referral: n/a  Additional Screening:  Hepatitis C Screening: does not qualify; Completed n/a  Vision Screening: Recommended annual ophthalmology exams for early detection of glaucoma and other disorders of the eye. Is the patient up to date with their annual eye exam?  No  Who is the provider or what is the name of the office in which the patient attends annual eye exams? N/A If pt is not established with a provider, would they like to be  referred to a provider to establish care? No .   Dental Screening: Recommended annual dental exams for proper oral hygiene  Community Resource Referral / Chronic Care Management: CRR required this visit?  No   CCM required this visit?  No      Plan:     I have personally reviewed and noted the following in the patient's chart:   Medical and social history Use of alcohol, tobacco or illicit drugs  Current medications and supplements including opioid prescriptions. Patient is not currently taking opioid prescriptions. Functional ability and status Nutritional status Physical activity Advanced directives List of other physicians Hospitalizations, surgeries, and ER visits in previous 12 months Vitals Screenings to include cognitive, depression, and falls Referrals and appointments  In addition, I have reviewed and discussed with patient certain preventive protocols, quality metrics, and best practice recommendations. A written personalized care plan for preventive services as well as general preventive health recommendations were provided to patient.     Octaviano Glow, CMA   08/13/2022   Nurse Notes: Non-Face to Face or Face to Face 17 minute visit Encounter   Ms. Gaskill , Thank you for taking time to come for your Medicare Wellness Visit. I appreciate your ongoing commitment to your health goals. Please review the following plan we discussed and let me know if I can assist you in the future.   These are the goals we discussed:  Goals      Patient Stated     Get blood pressure under control Exercise a little more        This is a list of the screening recommended for you and due dates:  Health Maintenance  Topic Date Due   COVID-19 Vaccine (1) Never done   HIV Screening  Never done   Hepatitis C Screening: USPSTF Recommendation to screen - Ages 85-79 yo.  Never done   Colon Cancer Screening  Never done   Mammogram  Never done   Zoster (Shingles) Vaccine (1 of 2)  Never done   Pap Smear  03/10/2020   Flu Shot  07/08/2022   Tetanus Vaccine  09/09/2022*   HPV Vaccine  Aged Out  *Topic was postponed. The date shown is not the original due date.

## 2022-09-06 ENCOUNTER — Other Ambulatory Visit: Payer: Self-pay | Admitting: Family Medicine

## 2022-09-09 DIAGNOSIS — Z6825 Body mass index (BMI) 25.0-25.9, adult: Secondary | ICD-10-CM | POA: Diagnosis not present

## 2022-09-09 DIAGNOSIS — Z1211 Encounter for screening for malignant neoplasm of colon: Secondary | ICD-10-CM | POA: Diagnosis not present

## 2022-09-09 DIAGNOSIS — B379 Candidiasis, unspecified: Secondary | ICD-10-CM | POA: Diagnosis not present

## 2022-09-09 DIAGNOSIS — J0140 Acute pansinusitis, unspecified: Secondary | ICD-10-CM | POA: Diagnosis not present

## 2022-09-09 DIAGNOSIS — T3695XA Adverse effect of unspecified systemic antibiotic, initial encounter: Secondary | ICD-10-CM | POA: Diagnosis not present

## 2022-09-12 DIAGNOSIS — M797 Fibromyalgia: Secondary | ICD-10-CM | POA: Diagnosis not present

## 2022-09-12 DIAGNOSIS — G43719 Chronic migraine without aura, intractable, without status migrainosus: Secondary | ICD-10-CM | POA: Diagnosis not present

## 2022-09-13 ENCOUNTER — Other Ambulatory Visit: Payer: Self-pay | Admitting: Family Medicine

## 2022-09-16 LAB — COLOGUARD: COLOGUARD: NEGATIVE

## 2022-09-17 ENCOUNTER — Encounter: Payer: Self-pay | Admitting: Family Medicine

## 2022-10-16 DIAGNOSIS — Z0100 Encounter for examination of eyes and vision without abnormal findings: Secondary | ICD-10-CM | POA: Diagnosis not present

## 2022-10-16 DIAGNOSIS — H524 Presbyopia: Secondary | ICD-10-CM | POA: Diagnosis not present

## 2022-11-03 ENCOUNTER — Telehealth: Payer: Self-pay | Admitting: Family Medicine

## 2022-11-03 NOTE — Telephone Encounter (Signed)
Left message for patient to call back to get scheduled for an office visit in order to refill her losartan medication.

## 2022-11-04 ENCOUNTER — Other Ambulatory Visit: Payer: Self-pay | Admitting: Family Medicine

## 2022-11-05 ENCOUNTER — Other Ambulatory Visit: Payer: Self-pay

## 2022-11-05 MED ORDER — LOSARTAN POTASSIUM 100 MG PO TABS: 100.0000 mg | ORAL_TABLET | Freq: Every day | ORAL | 0 refills | Status: DC

## 2022-11-14 ENCOUNTER — Encounter: Payer: Self-pay | Admitting: Family Medicine

## 2022-11-14 ENCOUNTER — Ambulatory Visit (INDEPENDENT_AMBULATORY_CARE_PROVIDER_SITE_OTHER): Payer: Medicare HMO | Admitting: Family Medicine

## 2022-11-14 VITALS — BP 108/72 | HR 59 | Temp 97.8°F | Ht 65.0 in | Wt 157.0 lb

## 2022-11-14 DIAGNOSIS — E785 Hyperlipidemia, unspecified: Secondary | ICD-10-CM

## 2022-11-14 DIAGNOSIS — E7800 Pure hypercholesterolemia, unspecified: Secondary | ICD-10-CM

## 2022-11-14 DIAGNOSIS — I10 Essential (primary) hypertension: Secondary | ICD-10-CM | POA: Diagnosis not present

## 2022-11-14 DIAGNOSIS — N1831 Chronic kidney disease, stage 3a: Secondary | ICD-10-CM

## 2022-11-14 DIAGNOSIS — L989 Disorder of the skin and subcutaneous tissue, unspecified: Secondary | ICD-10-CM

## 2022-11-14 DIAGNOSIS — R7303 Prediabetes: Secondary | ICD-10-CM

## 2022-11-14 DIAGNOSIS — Z23 Encounter for immunization: Secondary | ICD-10-CM

## 2022-11-14 DIAGNOSIS — E538 Deficiency of other specified B group vitamins: Secondary | ICD-10-CM

## 2022-11-14 LAB — VITAMIN B12: Vitamin B-12: 204 pg/mL — ABNORMAL LOW (ref 211–911)

## 2022-11-14 LAB — LIPID PANEL
Cholesterol: 184 mg/dL (ref 0–200)
HDL: 33.3 mg/dL — ABNORMAL LOW (ref >39.00)
LDL Cholesterol: 127 mg/dL — ABNORMAL HIGH (ref 0–99)
NonHDL: 150.96
Total CHOL/HDL Ratio: 6
Triglycerides: 122 mg/dL (ref 0.0–149.0)
VLDL: 24.4 mg/dL (ref 0.0–40.0)

## 2022-11-14 LAB — HEMOGLOBIN A1C: Hgb A1c MFr Bld: 5.7 % (ref 4.6–6.5)

## 2022-11-14 LAB — BASIC METABOLIC PANEL
BUN: 21 mg/dL (ref 6–23)
CO2: 30 mEq/L (ref 19–32)
Calcium: 9.4 mg/dL (ref 8.4–10.5)
Chloride: 104 mEq/L (ref 96–112)
Creatinine, Ser: 0.84 mg/dL (ref 0.40–1.20)
GFR: 78.69 mL/min (ref >60.00)
Glucose, Bld: 107 mg/dL — ABNORMAL HIGH (ref 70–99)
Potassium: 3.7 mEq/L (ref 3.5–5.1)
Sodium: 139 mEq/L (ref 135–145)

## 2022-11-14 MED ORDER — FUROSEMIDE 20 MG PO TABS
ORAL_TABLET | ORAL | 1 refills | Status: DC
Start: 2022-11-14 — End: 2023-05-14

## 2022-11-14 MED ORDER — TETANUS-DIPHTH-ACELL PERTUSSIS 5-2-15.5 LF-MCG/0.5 IM SUSP: 0.5000 mL | Freq: Once | INTRAMUSCULAR | 0 refills | Status: AC

## 2022-11-14 MED ORDER — PANTOPRAZOLE SODIUM 40 MG PO TBEC: DELAYED_RELEASE_TABLET | ORAL | 1 refills | Status: DC

## 2022-11-14 MED ORDER — METOPROLOL SUCCINATE ER 100 MG PO TB24: ORAL_TABLET | ORAL | 1 refills | Status: DC

## 2022-11-14 MED ORDER — LOSARTAN POTASSIUM 100 MG PO TABS: 100.0000 mg | ORAL_TABLET | Freq: Every day | ORAL | 1 refills | Status: DC

## 2022-11-14 NOTE — Progress Notes (Signed)
OFFICE VISIT  11/14/2022  CC:  Chief Complaint  Patient presents with   Hypertension   Hyperlipidemia    Pt is fasting   Chronic Kidney Disease    HPI:    Patient is a 56 y.o. female who presents for 67-month follow-up hypertension, hyperlipidemia, chronic renal insufficiency, and prediabetes. A/P as of last visit: "#1 hypertension, well controlled on losartan 100 mg a day and Toprol-XL 100 mg a day.. Electrolytes and creatinine today.  2. Hyperlipidemia.  She has preferred to not be on a statin. Lipid panel today.   #3 prediabetes. Most recent A1c was 6% about 8 months ago. Hemoglobin A1c ordered today.   #4 diastolic dysfunction and mild to moderate mitral insufficiency. Fluid balance looks good.  Blood pressure control good She uses Lasix as needed. Encouraged patient to get routine follow-up with Dr. Jacinto Halim.   #5 skin lesion left flank. Suspicious for BCC or SCC. Referred to Derm."  INTERIM HX:  Feeling well. No acute concerns. She feels like she is doing a pretty good moderate to low carb diet.  She walks for exercise. Home blood pressures have been normal.  ROS as above, plus--> no fevers, no CP, no SOB, no wheezing, no cough, no dizziness, no HAs, no rashes, no melena/hematochezia.  No polyuria or polydipsia.  No myalgias or arthralgias.  No focal weakness, paresthesias, or tremors.  No acute vision or hearing abnormalities.  No dysuria or unusual/new urinary urgency or frequency.  No recent changes in lower legs. No n/v/d or abd pain.  No palpitations.      Past Medical History:  Diagnosis Date   Allergic rhinitis    Cervical spondylosis 08/2020   C8 radiculopathy->Surgery 08/2020 (Dr. Yetta Barre)   Chronic renal insufficiency, stage 3 (moderate) (HCC)    GFR 50s   Colon cancer screening    09/2022 Cologuard negative.   COVID-19    12/2020   Diastolic dysfunction 09/2020 echo   grade II   Fibromyalgia    Heart defect    PFO--closure procedure approx 2009  (Dr. Jacinto Halim)   Hypercholesterolemia 08/2021   mild, frmhm cv risk 3.5%->TLC   Hypertension    Insomnia    Migraine syndrome    Mild mitral valve regurgitation 09/17/2020   09/2020 echo-->mild/mod MVR   prediab 08/2019   2020 Gluc 106->A1c 5.6%.  08/2021 Hba1c 6%.   Scoliosis     Past Surgical History:  Procedure Laterality Date   APPENDECTOMY  6th grade   CARDIAC SURGERY     PFO closure 2009   CARDIOVASCULAR STRESS TEST  10/10/2020   chronotropic incompetence.  No ischemia, normal LV fxn, normal wall motion, EF 70%.   CERVICAL SPINE SURGERY  remote past; also 08/2020   discectomy x 1, then discectomy with titanium fusion. 08/2020 C spine surg again (Dr. Yetta Barre)   CHOLECYSTECTOMY  prior to 2010   COLONOSCOPY     "1990s" --reason unknown   FOOT SURGERY  approx 1990   benign mass removed from bottom of foot   PFTs  01/13/2019   Minimal obstructive airway dz; no response to bronchodilator   TRANSTHORACIC ECHOCARDIOGRAM  09/17/2020   EF 60-65%, grd II DD, mild/mod MVR    Outpatient Medications Prior to Visit  Medication Sig Dispense Refill   carisoprodol (SOMA) 350 MG tablet Take 350 mg by mouth 4 (four) times daily.     cetirizine (ZYRTEC) 10 MG tablet Take 10 mg by mouth daily.     DULoxetine (CYMBALTA) 60 MG  capsule Take 60 mg by mouth daily.     fluticasone (FLONASE) 50 MCG/ACT nasal spray Place into both nostrils daily.     magnesium gluconate (MAGONATE) 500 MG tablet Take 500 mg by mouth 2 (two) times daily.     Melatonin 5 MG TABS Take 10 mg by mouth daily as needed.      Milnacipran HCl (SAVELLA PO) Take by mouth 2 (two) times daily.     naratriptan (AMERGE) 2.5 MG tablet Take 2.5 mg by mouth as needed for migraine. Take one (1) tablet at onset of headache; if returns or does not resolve, may repeat after 4 hours; do not exceed five (5) mg in 24 hours.      zolpidem (AMBIEN CR) 12.5 MG CR tablet Take 12.5 mg by mouth at bedtime as needed for sleep.     eletriptan (RELPAX) 40  MG tablet Take by mouth. (Patient not taking: Reported on 11/14/2022)     zolmitriptan (ZOMIG) 5 MG tablet Take by mouth.     zonisamide (ZONEGRAN) 100 MG capsule Take by mouth. (Patient not taking: Reported on 11/14/2022)     furosemide (LASIX) 20 MG tablet TAKE 1 TABLET(20 MG) BY MOUTH DAILY 90 tablet 1   losartan (COZAAR) 100 MG tablet Take 1 tablet (100 mg total) by mouth daily. 14 tablet 0   metoprolol succinate (TOPROL-XL) 100 MG 24 hr tablet TAKE WITH OR IMMEDIATELY FOLLOWING A MEAL 90 tablet 0   pantoprazole (PROTONIX) 40 MG tablet TAKE ONE TABLET BY MOUTH ONE TIME DAILY 90 tablet 0   No facility-administered medications prior to visit.    No Known Allergies  ROS As per HPI  PE:    11/14/2022   10:13 AM 06/03/2022    9:43 AM 03/10/2022   10:29 AM  Vitals with BMI  Height 5\' 5"  5\' 5"    Weight 157 lbs 163 lbs 13 oz 160 lbs  BMI 26.13 27.26   Systolic 108 118  Diastolic 72 84 89  Pulse 59 81 85    Physical Exam  Gen: Alert, well appearing.  Patient is oriented to person, place, time, and situation. AFFECT: pleasant, lucid thought and speech. CV: RRR, no m/r/g.   LUNGS: CTA bilat, nonlabored resps, good aeration in all lung fields. EXT: no clubbing or cyanosis.  no edema.    LABS:  Last CBC Lab Results  Component Value Date   WBC 7.5 09/09/2021   HGB 14.0 09/09/2021   HCT 40.6 09/09/2021   MCV 92.2 09/09/2021   MCH 31.6 05/28/2020   RDW 12.6 09/09/2021   PLT 348.0 09/09/2021   Last metabolic panel Lab Results  Component Value Date   GLUCOSE 107 (H) 06/03/2022   NA 140 06/03/2022   K 4.0 06/03/2022   CL 106 06/03/2022   CO2 26 06/03/2022   BUN 14 06/03/2022   CREATININE 1.13 06/03/2022   GFRNONAA >60 05/28/2020   CALCIUM 9.9 06/03/2022   PROT 6.9 06/03/2022   ALBUMIN 4.2 06/03/2022   BILITOT 0.4 06/03/2022   ALKPHOS 137 (H) 06/03/2022   AST 11 06/03/2022   ALT 13 06/03/2022   ANIONGAP 9 05/28/2020   Last lipids Lab Results  Component Value  Date   CHOL 194 06/03/2022   HDL 36.70 (L) 06/03/2022   LDLCALC 121 (H) 06/03/2022   LDLDIRECT 117 (H) 09/28/2020   TRIG 184.0 (H) 06/03/2022   CHOLHDL 5 06/03/2022   Last hemoglobin A1c Lab Results  Component Value Date   HGBA1C 5.8  06/03/2022   Last thyroid functions Lab Results  Component Value Date   TSH 2.30 09/09/2021    IMPRESSION AND PLAN:  #1 hypertension, well-controlled on losartan 100 mg a day and Toprol-XL 100 mg/day. Electrolytes and creatinine today.  2.  Chronic renal insufficiency, stage II/III. Avoid NSAIDs. Electrolytes and creatinine today.  3.  Mixed hyperlipidemia. She has been hesitant to use cholesterol-lowering medication in the past.  Fearful of its potential side effects.  We discussed this again today and she was agreeable to ongoing monitoring, no meds at this time.  #4 prediabetes.  Doing pretty well with diet. Weight is down 6 pounds in the last 6 months. Hemoglobin A1c today.  5.  Dermatology referral--ordered again today.  The last office we referred her to has closed.  An After Visit Summary was printed and given to the patient.  FOLLOW UP: Return in about 6 months (around 05/16/2023) for annual CPE (fasting).  Signed:  Santiago Bumpers, MD           11/14/2022

## 2022-11-17 ENCOUNTER — Encounter: Payer: Self-pay | Admitting: Family Medicine

## 2023-03-20 DIAGNOSIS — G43719 Chronic migraine without aura, intractable, without status migrainosus: Secondary | ICD-10-CM | POA: Diagnosis not present

## 2023-03-20 DIAGNOSIS — F5104 Psychophysiologic insomnia: Secondary | ICD-10-CM | POA: Diagnosis not present

## 2023-03-20 DIAGNOSIS — E538 Deficiency of other specified B group vitamins: Secondary | ICD-10-CM | POA: Diagnosis not present

## 2023-03-20 DIAGNOSIS — M797 Fibromyalgia: Secondary | ICD-10-CM | POA: Diagnosis not present

## 2023-05-14 NOTE — Patient Instructions (Signed)
   It was very nice to see you today!   PLEASE NOTE:   If you had any lab tests please let us know if you have not heard back within a few days. You may see your results on MyChart before we have a chance to review them but we will give you a call once they are reviewed by [place]. If we ordered any referrals today, please let us know if you have not heard from their office within the next 2 weeks. You should receive a letter via MyChart confirming if the referral was approved and their office contact information to schedule.  [facility] Address: [address] [place], [place] [address] Phone: [phone]  Please call to schedule your mammogram and bone density scan.

## 2023-05-15 ENCOUNTER — Other Ambulatory Visit: Payer: Self-pay | Admitting: Family Medicine

## 2023-05-17 ENCOUNTER — Other Ambulatory Visit: Payer: Self-pay | Admitting: Family Medicine

## 2023-05-18 ENCOUNTER — Ambulatory Visit (INDEPENDENT_AMBULATORY_CARE_PROVIDER_SITE_OTHER): Payer: Medicare HMO | Admitting: Family Medicine

## 2023-05-18 ENCOUNTER — Encounter: Payer: Self-pay | Admitting: Family Medicine

## 2023-05-18 VITALS — BP 121/77 | HR 54 | Ht 64.75 in | Wt 161.0 lb

## 2023-05-18 DIAGNOSIS — E538 Deficiency of other specified B group vitamins: Secondary | ICD-10-CM

## 2023-05-18 DIAGNOSIS — E7800 Pure hypercholesterolemia, unspecified: Secondary | ICD-10-CM

## 2023-05-18 DIAGNOSIS — I10 Essential (primary) hypertension: Secondary | ICD-10-CM | POA: Diagnosis not present

## 2023-05-18 DIAGNOSIS — Z1211 Encounter for screening for malignant neoplasm of colon: Secondary | ICD-10-CM

## 2023-05-18 DIAGNOSIS — N183 Chronic kidney disease, stage 3 unspecified: Secondary | ICD-10-CM

## 2023-05-18 DIAGNOSIS — Z23 Encounter for immunization: Secondary | ICD-10-CM

## 2023-05-18 DIAGNOSIS — Z0000 Encounter for general adult medical examination without abnormal findings: Secondary | ICD-10-CM

## 2023-05-18 DIAGNOSIS — Z124 Encounter for screening for malignant neoplasm of cervix: Secondary | ICD-10-CM

## 2023-05-18 DIAGNOSIS — R7303 Prediabetes: Secondary | ICD-10-CM | POA: Diagnosis not present

## 2023-05-18 LAB — COMPREHENSIVE METABOLIC PANEL
ALT: 12 U/L (ref 0–35)
AST: 11 U/L (ref 0–37)
Albumin: 4.2 g/dL (ref 3.5–5.2)
Alkaline Phosphatase: 128 U/L — ABNORMAL HIGH (ref 39–117)
BUN: 15 mg/dL (ref 6–23)
CO2: 26 mEq/L (ref 19–32)
Calcium: 9.5 mg/dL (ref 8.4–10.5)
Chloride: 105 mEq/L (ref 96–112)
Creatinine, Ser: 0.95 mg/dL (ref 0.40–1.20)
GFR: 67.64 mL/min (ref >60.00)
Glucose, Bld: 100 mg/dL — ABNORMAL HIGH (ref 70–99)
Potassium: 3.9 mEq/L (ref 3.5–5.1)
Sodium: 140 mEq/L (ref 135–145)
Total Bilirubin: 0.4 mg/dL (ref 0.2–1.2)
Total Protein: 6.7 g/dL (ref 6.0–8.3)

## 2023-05-18 LAB — LIPID PANEL
Cholesterol: 182 mg/dL (ref 0–200)
HDL: 39.4 mg/dL (ref >39.00)
LDL Cholesterol: 109 mg/dL — ABNORMAL HIGH (ref 0–99)
NonHDL: 142.46
Total CHOL/HDL Ratio: 5
Triglycerides: 167 mg/dL — ABNORMAL HIGH (ref 0.0–149.0)
VLDL: 33.4 mg/dL (ref 0.0–40.0)

## 2023-05-18 LAB — TSH: TSH: 3.22 u[IU]/mL (ref 0.35–5.50)

## 2023-05-18 LAB — CBC
HCT: 41.1 % (ref 36.0–46.0)
Hemoglobin: 13.7 g/dL (ref 12.0–15.0)
MCHC: 33.2 g/dL (ref 30.0–36.0)
MCV: 95.1 fl (ref 78.0–100.0)
Platelets: 315 10*3/uL (ref 150.0–400.0)
RBC: 4.32 Mil/uL (ref 3.87–5.11)
RDW: 12.6 % (ref 11.5–15.5)
WBC: 6.6 10*3/uL (ref 4.0–10.5)

## 2023-05-18 LAB — HEMOGLOBIN A1C: Hgb A1c MFr Bld: 5.4 % (ref 4.6–6.5)

## 2023-05-18 MED ORDER — FUROSEMIDE 20 MG PO TABS: ORAL_TABLET | ORAL | 1 refills | Status: DC

## 2023-05-18 MED ORDER — PANTOPRAZOLE SODIUM 40 MG PO TBEC: DELAYED_RELEASE_TABLET | ORAL | 1 refills | Status: DC

## 2023-05-18 MED ORDER — METOPROLOL SUCCINATE ER 100 MG PO TB24: ORAL_TABLET | ORAL | 1 refills | Status: DC

## 2023-05-18 MED ORDER — ZOSTER VAC RECOMB ADJUVANTED 50 MCG/0.5ML IM SUSR: 0.5000 mL | Freq: Once | INTRAMUSCULAR | 0 refills | Status: AC

## 2023-05-18 MED ORDER — LOSARTAN POTASSIUM 100 MG PO TABS: 100.0000 mg | ORAL_TABLET | Freq: Every day | ORAL | 1 refills | Status: DC

## 2023-05-18 MED ORDER — TETANUS-DIPHTH-ACELL PERTUSSIS 5-2.5-18.5 LF-MCG/0.5 IM SUSP: 0.5000 mL | Freq: Once | INTRAMUSCULAR | 0 refills | Status: AC

## 2023-05-18 NOTE — Telephone Encounter (Signed)
Pt has appt today

## 2023-05-18 NOTE — Telephone Encounter (Signed)
Appt today

## 2023-05-18 NOTE — Progress Notes (Signed)
Office Note 05/18/2023  CC:  Chief Complaint  Patient presents with   Annual Exam   Patient is a 57 y.o. female who is here for annual health maintenance exam and 15-month follow-up hypertension, hyperlipidemia, chronic renal insufficiency, and prediabetes. A/P as of last visit: "#1 hypertension, well-controlled on losartan 100 mg a day and Toprol-XL 100 mg/day. Electrolytes and creatinine today.   2.  Chronic renal insufficiency, stage II/III. Avoid NSAIDs. Electrolytes and creatinine today.   3.  Mixed hyperlipidemia. She has been hesitant to use cholesterol-lowering medication in the past.  Fearful of its potential side effects.  We discussed this again today and she was agreeable to ongoing monitoring, no meds at this time.   #4 prediabetes.  Doing pretty well with diet. Weight is down 6 pounds in the last 6 months. Hemoglobin A1c today.   5.  Dermatology referral--ordered again today.  The last office we referred her to has closed."  INTERIM HX: Gearldine feels well other than chronic fatigue which is unchanged lately.  He is followed by neurologist, Dr. Dalia Heading her migraines as well as insomnia.  PMP AWARE reviewed today: most recent rx for zolpidem was filled 05/13/23, # 30, rx by Dr. Thad Ranger. No red flags.   Past Medical History:  Diagnosis Date   Allergic rhinitis    Cervical spondylosis 08/2020   C8 radiculopathy->Surgery 08/2020 (Dr. Yetta Barre)   Chronic renal insufficiency, stage 3 (moderate) (HCC)    GFR 50s   Colon cancer screening    09/2022 Cologuard negative.   COVID-19    12/2020   Diastolic dysfunction 09/2020 echo   grade II   Fibromyalgia    Heart defect    PFO--closure procedure approx 2009 (Dr. Jacinto Halim)   Hypercholesterolemia 08/2021   mild, frmhm cv risk 3.5%->TLC   Hypertension    Insomnia    Migraine syndrome    Mild mitral valve regurgitation 09/17/2020   09/2020 echo-->mild/mod MVR   prediab 08/2019   2020 Gluc 106->A1c 5.6%.  08/2021  Hba1c 6%.   Scoliosis    Vitamin B12 deficiency     Past Surgical History:  Procedure Laterality Date   APPENDECTOMY  6th grade   CARDIAC SURGERY     PFO closure 2009   CARDIOVASCULAR STRESS TEST  10/10/2020   chronotropic incompetence.  No ischemia, normal LV fxn, normal wall motion, EF 70%.   CERVICAL SPINE SURGERY  remote past; also 08/2020   discectomy x 1, then discectomy with titanium fusion. 08/2020 C spine surg again (Dr. Yetta Barre)   CHOLECYSTECTOMY  prior to 2010   COLONOSCOPY     "1990s" --reason unknown   FOOT SURGERY  approx 1990   benign mass removed from bottom of foot   PFTs  01/13/2019   Minimal obstructive airway dz; no response to bronchodilator   TRANSTHORACIC ECHOCARDIOGRAM  09/17/2020   EF 60-65%, grd II DD, mild/mod MVR    Family History  Problem Relation Age of Onset   Heart disease Father    Diabetes Father    Kidney disease Father    Stroke Father    Heart disease Sister     Social History   Socioeconomic History   Marital status: Divorced    Spouse name: Not on file   Number of children: Not on file   Years of education: Not on file   Highest education level: Bachelor's degree (e.g., BA, AB, BS)  Occupational History   Not on file  Tobacco Use   Smoking status: Never  Smokeless tobacco: Never  Substance and Sexual Activity   Alcohol use: No   Drug use: No   Sexual activity: Not on file  Other Topics Concern   Not on file  Social History Narrative   Widow, 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   Educ: "4 yr degree"   Former occup: Architect.  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     Denies tob or alc or street drug use.   Says she was once on oxycontin for fibromyalgia pain but this was in the far remote past.   Social Determinants of Health   Financial Resource Strain: Low Risk  (08/13/2022)   Overall Financial Resource Strain (CARDIA)    Difficulty of Paying Living Expenses: Not very hard  Recent Concern:  Financial Resource Strain - High Risk (08/06/2022)   Overall Financial Resource Strain (CARDIA)    Difficulty of Paying Living Expenses: Very hard  Food Insecurity: No Food Insecurity (08/13/2022)   Hunger Vital Sign    Worried About Running Out of Food in the Last Year: Never true    Ran Out of Food in the Last Year: Never true  Transportation Needs: No Transportation Needs (08/13/2022)   PRAPARE - Administrator, Civil Service (Medical): No    Lack of Transportation (Non-Medical): No  Physical Activity: Unknown (08/06/2022)   Exercise Vital Sign    Days of Exercise per Week: Patient declined    Minutes of Exercise per Session: 10 min  Stress: Stress Concern Present (08/13/2022)   [employer] of Occupational Health - Occupational Stress Questionnaire    Feeling of Stress : To some extent  Social Connections: Moderately Integrated (08/13/2022)   Social Connection and Isolation Panel [NHANES]    Frequency of Communication with Friends and Family: More than three times a week    Frequency of Social Gatherings with Friends and Family: Once a week    Attends Religious Services: More than 4 times per year    Active Member of [employer] or Organizations: Yes    Attends Banker Meetings: Never    Marital Status: Divorced  Catering manager Violence: Not At Risk (08/13/2022)   Humiliation, Afraid, Rape, and Kick questionnaire    Fear of Current or Ex-Partner: No    Emotionally Abused: No    Physically Abused: No    Sexually Abused: No    Outpatient Medications Prior to Visit  Medication Sig Dispense Refill   carisoprodol (SOMA) 350 MG tablet Take 350 mg by mouth 4 (four) times daily.     cetirizine (ZYRTEC) 10 MG tablet Take 10 mg by mouth daily.     DULoxetine (CYMBALTA) 60 MG capsule Take 60 mg by mouth daily.     fluticasone (FLONASE) 50 MCG/ACT nasal spray Place into both nostrils daily.     magnesium gluconate (MAGONATE) 500 MG tablet Take 500 mg by mouth 2 (two)  times daily.     Melatonin 5 MG TABS Take 10 mg by mouth daily as needed.      naratriptan (AMERGE) 2.5 MG tablet Take 2.5 mg by mouth as needed for migraine. Take one (1) tablet at onset of headache; if returns or does not resolve, may repeat after 4 hours; do not exceed five (5) mg in 24 hours.      zolpidem (AMBIEN CR) 12.5 MG CR tablet Take 12.5 mg by mouth at bedtime as needed for sleep.     eletriptan (RELPAX) 40 MG tablet  Take by mouth. (Patient not taking: Reported on 11/14/2022)     zolmitriptan (ZOMIG) 5 MG tablet Take by mouth.     furosemide (LASIX) 20 MG tablet TAKE 1 TABLET(20 MG) BY MOUTH DAILY 90 tablet 1   losartan (COZAAR) 100 MG tablet Take 1 tablet (100 mg total) by mouth daily. 90 tablet 1   metoprolol succinate (TOPROL-XL) 100 MG 24 hr tablet Take with or immediately following a meal. 90 tablet 1   Milnacipran HCl (SAVELLA PO) Take by mouth 2 (two) times daily. (Patient not taking: Reported on 05/18/2023)     pantoprazole (PROTONIX) 40 MG tablet TAKE ONE TABLET BY MOUTH ONE TIME DAILY 90 tablet 1   zonisamide (ZONEGRAN) 100 MG capsule Take by mouth. (Patient not taking: Reported on 11/14/2022)     No facility-administered medications prior to visit.    No Known Allergies  Review of Systems  Constitutional:  Positive for fatigue. Negative for appetite change, chills and fever.  HENT:  Negative for congestion, dental problem, ear pain and sore throat.   Eyes:  Negative for discharge, redness and visual disturbance.  Respiratory:  Negative for cough, chest tightness, shortness of breath and wheezing.   Cardiovascular:  Negative for chest pain, palpitations and leg swelling.  Gastrointestinal:  Negative for abdominal pain, blood in stool, diarrhea, nausea and vomiting.  Genitourinary:  Negative for difficulty urinating, dysuria, flank pain, frequency, hematuria and urgency.  Musculoskeletal:  Negative for arthralgias, back pain, joint swelling, myalgias and neck stiffness.   Skin:  Negative for pallor and rash.  Neurological:  Negative for dizziness, speech difficulty, weakness and headaches.  Hematological:  Negative for adenopathy. Does not bruise/bleed easily.  Psychiatric/Behavioral:  Negative for confusion and sleep disturbance. The patient is not nervous/anxious.     PE;    05/18/2023   10:49 AM 11/14/2022   10:13 AM 06/03/2022    9:43 AM  Vitals with BMI  Height 5' 4.75" 5\' 5"  5\' 5"   Weight 161 lbs 157 lbs 163 lbs 13 oz  BMI 26.99 26.13 27.26  Systolic 121 108 161  Diastolic 77 72 84  Pulse 54 59 81   Exam chaperoned by Sammuel Cooper, CMA Gen: Alert, well appearing.  Patient is oriented to person, place, time, and situation. AFFECT: pleasant, lucid thought and speech. ENT: Ears: EACs clear, normal epithelium.  TMs with good light reflex and landmarks bilaterally.  Eyes: no injection, icteris, swelling, or exudate.  EOMI, PERRLA. Nose: no drainage or turbinate edema/swelling.  No injection or focal lesion.  Mouth: lips without lesion/swelling.  Oral mucosa pink and moist.  Dentition intact and without obvious caries or gingival swelling.  Oropharynx without erythema, exudate, or swelling.  Neck: supple/nontender.  No LAD, mass, or TM.  Carotid pulses 2+ bilaterally, without bruits. CV: RRR, no m/r/g.   LUNGS: CTA bilat, nonlabored resps, good aeration in all lung fields. ABD: soft, NT, ND, BS normal.  No hepatospenomegaly or mass.  No bruits. EXT: no clubbing, cyanosis, or edema.  Musculoskeletal: no joint swelling, erythema, warmth, or tenderness.  ROM of all joints intact. Skin - no sores or suspicious lesions or rashes or color changes  Pertinent labs:  Lab Results  Component Value Date   TSH 2.30 09/09/2021   Lab Results  Component Value Date   WBC 7.5 09/09/2021   HGB 14.0 09/09/2021   HCT 40.6 09/09/2021   MCV 92.2 09/09/2021   PLT 348.0 09/09/2021   Lab Results  Component Value Date  CREATININE 0.84 11/14/2022   BUN 21  11/14/2022   NA 139 11/14/2022   K 3.7 11/14/2022   CL 104 11/14/2022   CO2 30 11/14/2022   Lab Results  Component Value Date   ALT 13 06/03/2022   AST 11 06/03/2022   ALKPHOS 137 (H) 06/03/2022   BILITOT 0.4 06/03/2022   Lab Results  Component Value Date   CHOL 184 11/14/2022   Lab Results  Component Value Date   HDL 33.30 (L) 11/14/2022   Lab Results  Component Value Date   LDLCALC 127 (H) 11/14/2022   Lab Results  Component Value Date   TRIG 122.0 11/14/2022   Lab Results  Component Value Date   CHOLHDL 6 11/14/2022   Lab Results  Component Value Date   HGBA1C 5.7 11/14/2022   Lab Results  Component Value Date   VITAMINB12 204 (L) 11/14/2022   ASSESSMENT AND PLAN:   #1 health maintenance exam: Reviewed age and gender appropriate health maintenance issues (prudent diet, regular exercise, health risks of tobacco and excessive alcohol, use of seatbelts, fire alarms in home, use of sunscreen).  Also reviewed age and gender appropriate health screening as well as vaccine recommendations. Vaccines: Tdap->printed.  Shingrix->printed Labs: fasting HP +Hba1c (prediabetes). Breast ca screening: already ordered mammogram and bone density--- she will call to set these up.Marland Kitchen GYN ref ordered for cervical cancer screening. Colon ca screening: rpt cologuard 09/2025.  #2 chronic anxiety. Doing well on Cymbalta 60 mg a day.  #3 hypertension, doing well on losartan 100 mg a day and Toprol-XL 100 mg a day. Electrolytes and creatinine today.  4. mixed hyperlipidemia. She has been hesitant to use cholesterol-lowering medication in the past.  Fearful of its potential side effects.  We discussed this again today and she was agreeable to ongoing monitoring, no meds at this time  #5 chronic renal insufficiency stage III. Avoiding NSAIDs.  Hydrating well. Electrolytes and creatinine today.  6.  Prediabetes.  Fasting glucose and hemoglobin A1c today.  7.  Vitamin B12  deficiency. Her B12 level 2 months ago at her neurologist was within normal limits. Continue 1000 mcg  B12 daily.  An After Visit Summary was printed and given to the patient.  FOLLOW UP:  Return in about 6 months (around 11/17/2023) for routine chronic illness f/u.  Signed:  Santiago Bumpers, MD           05/18/2023

## 2023-06-22 ENCOUNTER — Emergency Department (HOSPITAL_BASED_OUTPATIENT_CLINIC_OR_DEPARTMENT_OTHER): Payer: Medicare HMO

## 2023-06-22 ENCOUNTER — Encounter (HOSPITAL_BASED_OUTPATIENT_CLINIC_OR_DEPARTMENT_OTHER): Payer: Self-pay | Admitting: Emergency Medicine

## 2023-06-22 ENCOUNTER — Other Ambulatory Visit: Payer: Self-pay

## 2023-06-22 ENCOUNTER — Emergency Department (HOSPITAL_BASED_OUTPATIENT_CLINIC_OR_DEPARTMENT_OTHER)
Admission: EM | Admit: 2023-06-22 | Discharge: 2023-06-22 | Disposition: A | Discharge status: Home / Self Care | Payer: Medicare HMO | Attending: Emergency Medicine | Admitting: Emergency Medicine

## 2023-06-22 DIAGNOSIS — R110 Nausea: Secondary | ICD-10-CM | POA: Diagnosis not present

## 2023-06-22 DIAGNOSIS — R61 Generalized hyperhidrosis: Secondary | ICD-10-CM | POA: Diagnosis not present

## 2023-06-22 DIAGNOSIS — R079 Chest pain, unspecified: Secondary | ICD-10-CM | POA: Insufficient documentation

## 2023-06-22 DIAGNOSIS — R0602 Shortness of breath: Secondary | ICD-10-CM | POA: Diagnosis not present

## 2023-06-22 DIAGNOSIS — I10 Essential (primary) hypertension: Secondary | ICD-10-CM | POA: Insufficient documentation

## 2023-06-22 DIAGNOSIS — R0789 Other chest pain: Secondary | ICD-10-CM | POA: Diagnosis not present

## 2023-06-22 DIAGNOSIS — Z79899 Other long term (current) drug therapy: Secondary | ICD-10-CM | POA: Diagnosis not present

## 2023-06-22 LAB — CBC
HCT: 38.9 % (ref 36.0–46.0)
Hemoglobin: 13.7 g/dL (ref 12.0–15.0)
MCH: 31.9 pg (ref 26.0–34.0)
MCHC: 35.2 g/dL (ref 30.0–36.0)
MCV: 90.7 fL (ref 80.0–100.0)
Platelets: 335 10*3/uL (ref 150–400)
RBC: 4.29 MIL/uL (ref 3.87–5.11)
RDW: 12.2 % (ref 11.5–15.5)
WBC: 16.1 10*3/uL — ABNORMAL HIGH (ref 4.0–10.5)
nRBC: 0 % (ref 0.0–0.2)

## 2023-06-22 LAB — BASIC METABOLIC PANEL
Anion gap: 9 (ref 5–15)
BUN: 16 mg/dL (ref 6–20)
CO2: 22 mmol/L (ref 22–32)
Calcium: 8.8 mg/dL — ABNORMAL LOW (ref 8.9–10.3)
Chloride: 104 mmol/L (ref 98–111)
Creatinine, Ser: 0.92 mg/dL (ref 0.44–1.00)
GFR, Estimated: >60 mL/min (ref >60)
Glucose, Bld: 131 mg/dL — ABNORMAL HIGH (ref 70–99)
Potassium: 3.9 mmol/L (ref 3.5–5.1)
Sodium: 135 mmol/L (ref 135–145)

## 2023-06-22 LAB — TROPONIN I (HIGH SENSITIVITY)
Troponin I (High Sensitivity): <2 ng/L (ref <18)
Troponin I (High Sensitivity): 3 ng/L (ref <18)

## 2023-06-22 NOTE — ED Triage Notes (Signed)
Pt reports sudden onset of sharp CP at 1800 w nausea, diaphoresis, weakness; reports pain has resolved now, took a zofran enroute, also c/o a HA

## 2023-06-22 NOTE — ED Provider Notes (Signed)
[facility] Provider Note   CSN: [id number] Arrival date & time: 06/22/23  1901     History  Chief Complaint  Patient presents with   Chest Pain   HPI Courtney Haynes is a 57 y.o. female with hypertension, chronic renal insufficiency, high cholesterol and fibromyalgia presenting for chest pain.  Started about 2 hours ago.  It is in the center of her chest and feels like a sharp pain radiates to her back.  Also endorses associated diaphoresis, nausea and shortness of breath.  Symptoms have resolved aside from some mild chest soreness.  Denies calf tenderness, OCP use, recent long trips, and known malignancy.   Chest Pain      Home Medications Prior to Admission medications   Medication Sig Start Date End Date Taking? Authorizing Provider  carisoprodol (SOMA) 350 MG tablet Take 350 mg by mouth 4 (four) times daily. 11/15/21   [provider]  cetirizine (ZYRTEC) 10 MG tablet Take 10 mg by mouth daily.    [provider]  DULoxetine (CYMBALTA) 60 MG capsule Take 60 mg by mouth daily. 11/13/22   [provider]  eletriptan (RELPAX) 40 MG tablet Take by mouth. Patient not taking: Reported on 11/14/2022 02/26/22   [provider]  fluticasone (FLONASE) 50 MCG/ACT nasal spray Place into both nostrils daily.    [provider]  furosemide (LASIX) 20 MG tablet TAKE 1 TABLET(20 MG) BY MOUTH DAILY 05/18/23   McGowen, Maryjean Morn, MD  losartan (COZAAR) 100 MG tablet Take 1 tablet (100 mg total) by mouth daily. 05/18/23   McGowen, Maryjean Morn, MD  magnesium gluconate (MAGONATE) 500 MG tablet Take 500 mg by mouth 2 (two) times daily.    [provider]  Melatonin 5 MG TABS Take 10 mg by mouth daily as needed.     [provider]  metoprolol succinate (TOPROL-XL) 100 MG 24 hr tablet Take with or immediately following a meal. 05/18/23   McGowen, Maryjean Morn, MD  naratriptan (AMERGE) 2.5 MG tablet Take 2.5  mg by mouth as needed for migraine. Take one (1) tablet at onset of headache; if returns or does not resolve, may repeat after 4 hours; do not exceed five (5) mg in 24 hours.     [provider]  pantoprazole (PROTONIX) 40 MG tablet TAKE ONE TABLET BY MOUTH ONE TIME DAILY 05/18/23   McGowen, Maryjean Morn, MD  zolmitriptan (ZOMIG) 5 MG tablet Take by mouth. 08/15/21 08/15/22  [provider]  zolpidem (AMBIEN CR) 12.5 MG CR tablet Take 12.5 mg by mouth at bedtime as needed for sleep.    [provider]      Allergies    Patient has no known allergies.    Review of Systems   Review of Systems  Cardiovascular:  Positive for chest pain.    Physical Exam Updated Vital Signs BP 112/69 (BP Location: Right Arm)   Pulse 62   Temp 98.2 F (36.8 C) (Oral)   Resp 16   Ht 5\' 5"  (1.651 m)   Wt 68 kg   SpO2 97%   BMI 24.96 kg/m  Physical Exam Vitals and nursing note reviewed.  HENT:     Head: Normocephalic and atraumatic.     Mouth/Throat:     Mouth: Mucous membranes are moist.  Eyes:     General:        Right eye: No discharge.        Left  eye: No discharge.     Conjunctiva/sclera: Conjunctivae normal.  Cardiovascular:     Rate and Rhythm: Normal rate and regular rhythm.     Pulses: Normal pulses.     Heart sounds: Normal heart sounds.  Pulmonary:     Effort: Pulmonary effort is normal.     Breath sounds: Normal breath sounds.  Abdominal:     General: Abdomen is flat.     Palpations: Abdomen is soft.  Skin:    General: Skin is warm and dry.  Neurological:     General: No focal deficit present.  Psychiatric:        Mood and Affect: Mood normal.     ED Results / Procedures / Treatments   Labs (all labs ordered are listed, but only abnormal results are displayed) Labs Reviewed  BASIC METABOLIC PANEL - Abnormal; Notable for the following components:      Result Value   Glucose, Bld 131 (*)    Calcium 8.8 (*)    All other components within normal limits   CBC - Abnormal; Notable for the following components:   WBC 16.1 (*)    All other components within normal limits  TROPONIN I (HIGH SENSITIVITY)  TROPONIN I (HIGH SENSITIVITY)    EKG None  Radiology DG Chest 2 View  Result Date: 06/22/2023 CLINICAL DATA:  Chest pain EXAM: CHEST - 2 VIEW COMPARISON:  09/04/2020 FINDINGS: The heart size and mediastinal contours are within normal limits. Both lungs are clear. Hardware in the cervical spine. IMPRESSION: No active cardiopulmonary disease. Electronically Signed   By: Jasmine Pang M.D.   On: 06/22/2023 20:06    Procedures Procedures    Medications Ordered in ED Medications - No data to display  ED Course/ Medical Decision Making/ A&P                             Medical Decision Making Amount and/or Complexity of Data Reviewed Labs: ordered. Radiology: ordered.   57 year old female presenting for chest pain.  Exam is unremarkable.  DDx includes ACS, PE, pneumonia, pneumothorax.  ACS evaluation unremarkable.  PE unlikely given not hypoxic, tachycardic and without calf tenderness.  Also chest pain completely resolved on reassessment.  X-ray was negative making pneumonia and pneumothorax unlikely.  Advised to follow-up with her PCP.  Vital stable discharge.  Discharged home.        Final Clinical Impression(s) / ED Diagnoses Final diagnoses:  Chest pain, unspecified type    Rx / DC Orders ED Discharge Orders     None         Gareth Eagle, PA-C 06/22/23 2316    Benjiman Core, MD 06/23/23 1439

## 2023-06-22 NOTE — Discharge Instructions (Addendum)
Evaluation for your chest pain was overall reassuring.  Recommend you follow-up with your PCP.  If you have worsening chest pain, shortness of breath, calf tenderness or any other concern please return emergency department further evaluation.

## 2023-06-25 ENCOUNTER — Telehealth: Payer: Self-pay

## 2023-06-25 NOTE — Telephone Encounter (Signed)
Please advise 

## 2023-06-25 NOTE — Telephone Encounter (Signed)
Patient was seen in ED on 7/15 with trouble breathing/SOB; ED could not find anything wrong. After thinking about it, she remember she was given Proventil inhaler for Asthma back in 2015.  Has not needed since then, so she thought she was better. I scheduled appt to follow up with Dr. Milinda Cave on 7/26 (first available opening).  What she do until then, if she has another "attack"? She is requesting Proventil or emergency inhaler until appt.  Please advise [phone]

## 2023-06-26 MED ORDER — ALBUTEROL SULFATE HFA 108 (90 BASE) MCG/ACT IN AERS: 2.0000 | INHALATION_SPRAY | Freq: Four times a day (QID) | RESPIRATORY_TRACT | 0 refills | Status: DC | PRN

## 2023-06-26 NOTE — Telephone Encounter (Signed)
Ok, albuterol inhaler rx'd

## 2023-07-02 NOTE — Patient Instructions (Signed)

## 2023-07-03 ENCOUNTER — Encounter: Payer: Self-pay | Admitting: Family Medicine

## 2023-07-03 ENCOUNTER — Ambulatory Visit (INDEPENDENT_AMBULATORY_CARE_PROVIDER_SITE_OTHER): Payer: Medicare HMO | Admitting: Family Medicine

## 2023-07-03 VITALS — BP 118/79 | HR 80 | Temp 98.1°F | Ht 65.0 in | Wt 162.6 lb

## 2023-07-03 DIAGNOSIS — R0789 Other chest pain: Secondary | ICD-10-CM

## 2023-07-03 DIAGNOSIS — J4521 Mild intermittent asthma with (acute) exacerbation: Secondary | ICD-10-CM

## 2023-07-03 MED ORDER — FLUTICASONE PROPIONATE HFA 110 MCG/ACT IN AERO: 2.0000 | INHALATION_SPRAY | Freq: Two times a day (BID) | RESPIRATORY_TRACT | 2 refills | Status: DC

## 2023-07-03 NOTE — Progress Notes (Signed)
OFFICE VISIT  07/03/2023  CC:  Chief Complaint  Patient presents with   Follow-up    ED    Patient is a 57 y.o. female who presents for urgency department follow-up. She presented to [facility] emergency department on 06/22/2023.  I reviewed all the data from that encounter today. Presented with CP and associated sob. ACS eval unremarkable.  CXR normal.  EKG normal. She was reassured and told to f/u with me.  INTERIM HX: Looking back, she feels like she had been having a little bit of trouble with chest tightness and wheezing intermittently leading up to the ED visit and since then.  She has not had any chest pain or severe shortness of breath episodes since the ED visit. Most of her symptoms are at nighttime. She has been taking albuterol twice a day since emergency visit and feels like it is helping. She does have a history of intermittent asthma.  Has some environmental allergies.  Recently has restarted taking Flonase as well as an antihistamine.  No leg pain, no leg swelling, no fever, no cough.  No dizziness, no palpitations.  No hoarse voice, no swallowing difficulties.  She does have some postnasal drip.  Past Medical History:  Diagnosis Date   Allergic rhinitis    Cervical spondylosis 08/2020   C8 radiculopathy->Surgery 08/2020 (Dr. Yetta Barre)   Chronic renal insufficiency, stage 3 (moderate) (HCC)    GFR 50s   Colon cancer screening    09/2022 Cologuard negative.   COVID-19    12/2020   Diastolic dysfunction 09/2020 echo   grade II   Fibromyalgia    Heart defect    PFO--closure procedure approx 2009 (Dr. Jacinto Halim)   Hypercholesterolemia 08/2021   mild, frmhm cv risk 3.5%->TLC   Hypertension    Insomnia    Migraine syndrome    Mild mitral valve regurgitation 09/17/2020   09/2020 echo-->mild/mod MVR   prediab 08/2019   2020 Gluc 106->A1c 5.6%.  08/2021 Hba1c 6%.   Scoliosis    Vitamin B12 deficiency     Past Surgical History:  Procedure  Laterality Date   APPENDECTOMY  6th grade   CARDIAC SURGERY     PFO closure 2009   CARDIOVASCULAR STRESS TEST  10/10/2020   chronotropic incompetence.  No ischemia, normal LV fxn, normal wall motion, EF 70%.   CERVICAL SPINE SURGERY  remote past; also 08/2020   discectomy x 1, then discectomy with titanium fusion. 08/2020 C spine surg again (Dr. Yetta Barre)   CHOLECYSTECTOMY  prior to 2010   COLONOSCOPY     "1990s" --reason unknown   FOOT SURGERY  approx 1990   benign mass removed from bottom of foot   PFTs  01/13/2019   Minimal obstructive airway dz; no response to bronchodilator   TRANSTHORACIC ECHOCARDIOGRAM  09/17/2020   EF 60-65%, grd II DD, mild/mod MVR    Outpatient Medications Prior to Visit  Medication Sig Dispense Refill   carisoprodol (SOMA) 350 MG tablet Take 350 mg by mouth 4 (four) times daily.     cetirizine (ZYRTEC) 10 MG tablet Take 10 mg by mouth daily.     DULoxetine (CYMBALTA) 60 MG capsule Take 60 mg by mouth daily.     fluticasone (FLONASE) 50 MCG/ACT nasal spray Place into both nostrils daily.     furosemide (LASIX) 20 MG tablet TAKE 1 TABLET(20 MG) BY MOUTH DAILY 90 tablet 1   losartan (COZAAR) 100 MG tablet Take 1 tablet (100 mg total) by  mouth daily. 90 tablet 1   magnesium gluconate (MAGONATE) 500 MG tablet Take 500 mg by mouth 2 (two) times daily.     Melatonin 5 MG TABS Take 10 mg by mouth daily as needed.      metoprolol succinate (TOPROL-XL) 100 MG 24 hr tablet Take with or immediately following a meal. 90 tablet 1   naratriptan (AMERGE) 2.5 MG tablet Take 2.5 mg by mouth as needed for migraine. Take one (1) tablet at onset of headache; if returns or does not resolve, may repeat after 4 hours; do not exceed five (5) mg in 24 hours.      pantoprazole (PROTONIX) 40 MG tablet TAKE ONE TABLET BY MOUTH ONE TIME DAILY 90 tablet 1   zolpidem (AMBIEN CR) 12.5 MG CR tablet Take 12.5 mg by mouth at bedtime as needed for sleep.     albuterol (VENTOLIN HFA) 108 (90 Base)  MCG/ACT inhaler Inhale 2 puffs into the lungs every 6 (six) hours as needed for wheezing or shortness of breath. (Patient not taking: Reported on 07/03/2023) 8 g 0   eletriptan (RELPAX) 40 MG tablet Take by mouth. (Patient not taking: Reported on 11/14/2022)     zolmitriptan (ZOMIG) 5 MG tablet Take by mouth.     No facility-administered medications prior to visit.    No Known Allergies  Review of Systems As per HPI  PE:    07/03/2023    2:01 PM 06/22/2023   10:15 PM 06/22/2023   10:00 PM  Vitals with BMI  Height 5\' 5"     Weight 162 lbs 10 oz    BMI 27.06    Systolic 118 112 132  Diastolic 79 69 64  Pulse 80 62 65     Physical Exam  Gen: Alert, well appearing.  Patient is oriented to person, place, time, and situation. AFFECT: pleasant, lucid thought and speech. CV: RRR, no m/r/g.   LUNGS: CTA bilat, nonlabored resps, good aeration in all lung fields. Trace end expiratory wheezing bilateral. EXT: no clubbing or cyanosis.  no edema.    LABS:  Last CBC Lab Results  Component Value Date   WBC 16.1 (H) 06/22/2023   HGB 13.7 06/22/2023   HCT 38.9 06/22/2023   MCV 90.7 06/22/2023   MCH 31.9 06/22/2023   RDW 12.2 06/22/2023   PLT 335 06/22/2023   Last metabolic panel Lab Results  Component Value Date   GLUCOSE 131 (H) 06/22/2023   NA 135 06/22/2023   K 3.9 06/22/2023   CL 104 06/22/2023   CO2 22 06/22/2023   BUN 16 06/22/2023   CREATININE 0.92 06/22/2023   GFRNONAA >60 06/22/2023   CALCIUM 8.8 (L) 06/22/2023   PROT 6.7 05/18/2023   ALBUMIN 4.2 05/18/2023   BILITOT 0.4 05/18/2023   ALKPHOS 128 (H) 05/18/2023   AST 11 05/18/2023   ALT 12 05/18/2023   ANIONGAP 9 06/22/2023   Lab Results  Component Value Date   DDIMER <0.27 05/28/2020   TN I x 2 neg 06/22/23  IMPRESSION AND PLAN:  #1 atypical chest pain.  Suspect this was bronchospasm.  Her intermittent asthma has been flaring mildly/subtly lately. Will start Flovent 110 mcg inhaler, 2 puffs twice daily.   Anticipate this being a 1 to 41-month treatment and see if we can get her off of it after that. Continue albuterol 2 puffs every 6 hours as needed.  An After Visit Summary was printed and given to the patient.  FOLLOW UP: Return in about 4  weeks (around 07/31/2023) for f/u asthma.  Signed:  Santiago Bumpers, MD           07/03/2023

## 2023-07-06 ENCOUNTER — Other Ambulatory Visit (HOSPITAL_COMMUNITY): Payer: Self-pay

## 2023-07-06 ENCOUNTER — Telehealth: Payer: Self-pay | Admitting: Family Medicine

## 2023-07-06 ENCOUNTER — Other Ambulatory Visit (HOSPITAL_BASED_OUTPATIENT_CLINIC_OR_DEPARTMENT_OTHER): Payer: Self-pay

## 2023-07-06 MED ORDER — ARNUITY ELLIPTA 200 MCG/ACT IN AEPB
1.0000 | INHALATION_SPRAY | Freq: Every day | RESPIRATORY_TRACT | 1 refills | Status: DC
  Filled 2023-07-06: qty 30, 30d supply, fill #0
  Filled 2023-09-17: qty 30, 30d supply, fill #1

## 2023-07-06 NOTE — Telephone Encounter (Signed)
Ok done

## 2023-07-06 NOTE — Telephone Encounter (Signed)
Please advise of alternative. Pharmacy updated

## 2023-07-06 NOTE — Telephone Encounter (Signed)
Patients mother called and is requesting an alternative medication be called in due to the cost of fluticasone (FLOVENT HFA) 110 MCG/ACT inhaler. She is requesting Arnuity and for it to be called into the [facility] pharmacy at the med center in [place]. She was unsure of the name of the exact pharmacy

## 2023-07-08 ENCOUNTER — Encounter (INDEPENDENT_AMBULATORY_CARE_PROVIDER_SITE_OTHER): Payer: Self-pay

## 2023-07-31 ENCOUNTER — Ambulatory Visit: Payer: Medicare HMO | Admitting: Family Medicine

## 2023-07-31 ENCOUNTER — Encounter: Payer: Self-pay | Admitting: Family Medicine

## 2023-07-31 VITALS — BP 98/64 | HR 70 | Temp 98.7°F | Ht 65.0 in | Wt 166.2 lb

## 2023-07-31 DIAGNOSIS — J4530 Mild persistent asthma, uncomplicated: Secondary | ICD-10-CM | POA: Diagnosis not present

## 2023-07-31 NOTE — Progress Notes (Signed)
OFFICE VISIT  07/31/2023  CC:  Chief Complaint  Patient presents with   Asthma    Patient is a 57 y.o. female who presents for 1 month follow-up asthma. A/P as of last visit: "#1 atypical chest pain.  Suspect this was bronchospasm.  Her intermittent asthma has been flaring mildly/subtly lately. Will start Flovent 110 mcg inhaler, 2 puffs twice daily.  Anticipate this being a 1 to 39-month treatment and see if we can get her off of it after that. Continue albuterol 2 puffs every 6 hours as needed."  INTERIM HX: Her insurer ended up dictating our choice of inhaler.  Ended up prescribing Anoro Ellipta 1 puff daily. She feels better.  Minimal cough.  No shortness of breath or wheezing.   Past Medical History:  Diagnosis Date   Allergic rhinitis    Cervical spondylosis 08/2020   C8 radiculopathy->Surgery 08/2020 (Dr. Yetta Barre)   Chronic renal insufficiency, stage 3 (moderate) (HCC)    GFR 50s   Colon cancer screening    09/2022 Cologuard negative.   COVID-19    12/2020   Diastolic dysfunction 09/2020 echo   grade II   Fibromyalgia    Heart defect    PFO--closure procedure approx 2009 (Dr. Jacinto Halim)   Hypercholesterolemia 08/2021   mild, frmhm cv risk 3.5%->TLC   Hypertension    Insomnia    Migraine syndrome    Mild mitral valve regurgitation 09/17/2020   09/2020 echo-->mild/mod MVR   prediab 08/2019   2020 Gluc 106->A1c 5.6%.  08/2021 Hba1c 6%.   Scoliosis    Vitamin B12 deficiency     Past Surgical History:  Procedure Laterality Date   APPENDECTOMY  6th grade   CARDIAC SURGERY     PFO closure 2009   CARDIOVASCULAR STRESS TEST  10/10/2020   chronotropic incompetence.  No ischemia, normal LV fxn, normal wall motion, EF 70%.   CERVICAL SPINE SURGERY  remote past; also 08/2020   discectomy x 1, then discectomy with titanium fusion. 08/2020 C spine surg again (Dr. Yetta Barre)   CHOLECYSTECTOMY  prior to 2010   COLONOSCOPY     "1990s" --reason unknown   FOOT SURGERY  approx 1990    benign mass removed from bottom of foot   PFTs  01/13/2019   Minimal obstructive airway dz; no response to bronchodilator   TRANSTHORACIC ECHOCARDIOGRAM  09/17/2020   EF 60-65%, grd II DD, mild/mod MVR    Outpatient Medications Prior to Visit  Medication Sig Dispense Refill   carisoprodol (SOMA) 350 MG tablet Take 350 mg by mouth 4 (four) times daily.     cetirizine (ZYRTEC) 10 MG tablet Take 10 mg by mouth daily.     DULoxetine (CYMBALTA) 60 MG capsule Take 60 mg by mouth daily.     fluticasone (FLONASE) 50 MCG/ACT nasal spray Place into both nostrils daily.     Fluticasone Furoate (ARNUITY ELLIPTA) 200 MCG/ACT AEPB Take 1 puff by mouth daily. 30 each 1   furosemide (LASIX) 20 MG tablet TAKE 1 TABLET(20 MG) BY MOUTH DAILY 90 tablet 1   losartan (COZAAR) 100 MG tablet Take 1 tablet (100 mg total) by mouth daily. 90 tablet 1   magnesium gluconate (MAGONATE) 500 MG tablet Take 500 mg by mouth 2 (two) times daily.     Melatonin 5 MG TABS Take 10 mg by mouth daily as needed.      metoprolol succinate (TOPROL-XL) 100 MG 24 hr tablet Take with or immediately following a meal. 90 tablet 1  naratriptan (AMERGE) 2.5 MG tablet Take 2.5 mg by mouth as needed for migraine. Take one (1) tablet at onset of headache; if returns or does not resolve, may repeat after 4 hours; do not exceed five (5) mg in 24 hours.      pantoprazole (PROTONIX) 40 MG tablet TAKE ONE TABLET BY MOUTH ONE TIME DAILY 90 tablet 1   zolpidem (AMBIEN CR) 12.5 MG CR tablet Take 12.5 mg by mouth at bedtime as needed for sleep.     albuterol (VENTOLIN HFA) 108 (90 Base) MCG/ACT inhaler Inhale 2 puffs into the lungs every 6 (six) hours as needed for wheezing or shortness of breath. (Patient not taking: Reported on 07/03/2023) 8 g 0   eletriptan (RELPAX) 40 MG tablet Take by mouth. (Patient not taking: Reported on 11/14/2022)     zolmitriptan (ZOMIG) 5 MG tablet Take by mouth.     No facility-administered medications prior to visit.     No Known Allergies  Review of Systems As per HPI  PE:    07/31/2023    1:03 PM 07/03/2023    2:01 PM 06/22/2023   10:15 PM  Vitals with BMI  Height 5\' 5"  5\' 5"    Weight 166 lbs 3 oz 162 lbs 10 oz   BMI 27.66 27.06   Systolic 98 118 112  Diastolic 64 79 69  Pulse 70 80 62     Physical Exam  Gen: Alert, well appearing.  Patient is oriented to person, place, time, and situation. AFFECT: pleasant, lucid thought and speech. CV: RRR, no m/r/g.   LUNGS: CTA bilat, nonlabored resps, good aeration in all lung fields.   LABS:  Last CBC Lab Results  Component Value Date   WBC 16.1 (H) 06/22/2023   HGB 13.7 06/22/2023   HCT 38.9 06/22/2023   MCV 90.7 06/22/2023   MCH 31.9 06/22/2023   RDW 12.2 06/22/2023   PLT 335 06/22/2023   Last metabolic panel Lab Results  Component Value Date   GLUCOSE 131 (H) 06/22/2023   NA 135 06/22/2023   K 3.9 06/22/2023   CL 104 06/22/2023   CO2 22 06/22/2023   BUN 16 06/22/2023   CREATININE 0.92 06/22/2023   GFRNONAA >60 06/22/2023   CALCIUM 8.8 (L) 06/22/2023   PROT 6.7 05/18/2023   ALBUMIN 4.2 05/18/2023   BILITOT 0.4 05/18/2023   ALKPHOS 128 (H) 05/18/2023   AST 11 05/18/2023   ALT 12 05/18/2023   ANIONGAP 9 06/22/2023   Last lipids Lab Results  Component Value Date   CHOL 182 05/18/2023   HDL 39.40 05/18/2023   LDLCALC 109 (H) 05/18/2023   LDLDIRECT 117 (H) 09/28/2020   TRIG 167.0 (H) 05/18/2023   CHOLHDL 5 05/18/2023   Last hemoglobin A1c Lab Results  Component Value Date   HGBA1C 5.4 05/18/2023   Last thyroid functions Lab Results  Component Value Date   TSH 3.22 05/18/2023   Lab Results  Component Value Date   CALCIUM 8.8 (L) 06/22/2023   CAION 1.03 (L) 11/11/2018   Last vitamin B12 and Folate Lab Results  Component Value Date   VITAMINB12 204 (L) 11/14/2022   IMPRESSION AND PLAN:  Mild persistent asthma. Recent bronchospasm episodes have been causing chest pain.  This is all improved on Arnuity  Ellipta 1 puff daily. She will finish her current prescription and get 1 refill and when she finishes the refill she will see how she does off this medication.  An After Visit Summary was printed and  given to the patient.  FOLLOW UP: Return for Keep appointment set for 11/17/2023.  Signed:  Santiago Bumpers, MD           07/31/2023

## 2023-08-26 ENCOUNTER — Ambulatory Visit (INDEPENDENT_AMBULATORY_CARE_PROVIDER_SITE_OTHER): Payer: Medicare HMO

## 2023-08-26 VITALS — Wt 160.0 lb

## 2023-08-26 DIAGNOSIS — Z0000 Encounter for general adult medical examination without abnormal findings: Secondary | ICD-10-CM | POA: Diagnosis not present

## 2023-08-26 DIAGNOSIS — Z1231 Encounter for screening mammogram for malignant neoplasm of breast: Secondary | ICD-10-CM | POA: Diagnosis not present

## 2023-08-26 NOTE — Progress Notes (Signed)
Subjective:   Courtney Haynes is a 57 y.o. female who presents for Medicare Annual (Subsequent) preventive examination.  Visit Complete: Virtual  I connected with  Tresa B Hart on 08/26/23 by a audio enabled telemedicine application and verified that I am speaking with the correct person using two identifiers.  Patient Location: Home  Provider Location: Home Office  I discussed the limitations of evaluation and management by telemedicine. The patient expressed understanding and agreed to proceed.   Vital Signs: Unable to obtain new vitals due to this being a telehealth visit.   Cardiac Risk Factors include: advanced age (>67men, >26 women);hypertension     Objective:    Today's Vitals   08/26/23 1532  Weight: 160 lb (72.6 kg)   Body mass index is 26.63 kg/m.     08/26/2023    3:41 PM 06/22/2023    7:20 PM 08/13/2022   10:55 AM 07/10/2021    3:58 PM 05/28/2020    2:11 PM 11/11/2018    7:21 PM  Advanced Directives  Does Patient Have a Medical Advance Directive? No No No No No No  Would patient like information on creating a medical advance directive? No - Patient declined No - Patient declined  No - Patient declined  No - Patient declined    Current Medications (verified) Outpatient Encounter Medications as of 08/26/2023  Medication Sig   carisoprodol (SOMA) 350 MG tablet Take 350 mg by mouth 4 (four) times daily.   cetirizine (ZYRTEC) 10 MG tablet Take 10 mg by mouth daily.   DULoxetine (CYMBALTA) 60 MG capsule Take 60 mg by mouth daily.   eletriptan (RELPAX) 40 MG tablet Take by mouth.   fluticasone (FLONASE) 50 MCG/ACT nasal spray Place into both nostrils daily.   Fluticasone Furoate (ARNUITY ELLIPTA) 200 MCG/ACT AEPB Take 1 puff by mouth daily.   furosemide (LASIX) 20 MG tablet TAKE 1 TABLET(20 MG) BY MOUTH DAILY   losartan (COZAAR) 100 MG tablet Take 1 tablet (100 mg total) by mouth daily.   magnesium gluconate (MAGONATE) 500 MG tablet Take 500 mg by mouth 2 (two)  times daily.   Melatonin 5 MG TABS Take 10 mg by mouth daily as needed.    metoprolol succinate (TOPROL-XL) 100 MG 24 hr tablet Take with or immediately following a meal.   pantoprazole (PROTONIX) 40 MG tablet TAKE ONE TABLET BY MOUTH ONE TIME DAILY   zolpidem (AMBIEN CR) 12.5 MG CR tablet Take 12.5 mg by mouth at bedtime as needed for sleep.   albuterol (VENTOLIN HFA) 108 (90 Base) MCG/ACT inhaler Inhale 2 puffs into the lungs every 6 (six) hours as needed for wheezing or shortness of breath. (Patient not taking: Reported on 07/03/2023)   naratriptan (AMERGE) 2.5 MG tablet Take 2.5 mg by mouth as needed for migraine. Take one (1) tablet at onset of headache; if returns or does not resolve, may repeat after 4 hours; do not exceed five (5) mg in 24 hours.  (Patient not taking: Reported on 08/26/2023)   zolmitriptan (ZOMIG) 5 MG tablet Take by mouth.   No facility-administered encounter medications on file as of 08/26/2023.    Allergies (verified) Patient has no known allergies.   History: Past Medical History:  Diagnosis Date   Allergic rhinitis    Cervical spondylosis 08/2020   C8 radiculopathy->Surgery 08/2020 (Dr. Yetta Barre)   Chronic renal insufficiency, stage 3 (moderate) (HCC)    GFR 50s   Colon cancer screening    09/2022 Cologuard negative.  COVID-19    12/2020   Diastolic dysfunction 09/2020 echo   grade II   Fibromyalgia    Heart defect    PFO--closure procedure approx 2009 (Dr. Jacinto Halim)   Hypercholesterolemia 08/2021   mild, frmhm cv risk 3.5%->TLC   Hypertension    Insomnia    Migraine syndrome    Mild mitral valve regurgitation 09/17/2020   09/2020 echo-->mild/mod MVR   prediab 08/2019   2020 Gluc 106->A1c 5.6%.  08/2021 Hba1c 6%.   Scoliosis    Vitamin B12 deficiency    Past Surgical History:  Procedure Laterality Date   APPENDECTOMY  6th grade   CARDIAC SURGERY     PFO closure 2009   CARDIOVASCULAR STRESS TEST  10/10/2020   chronotropic incompetence.  No ischemia,  normal LV fxn, normal wall motion, EF 70%.   CERVICAL SPINE SURGERY  remote past; also 08/2020   discectomy x 1, then discectomy with titanium fusion. 08/2020 C spine surg again (Dr. Yetta Barre)   CHOLECYSTECTOMY  prior to 2010   COLONOSCOPY     "1990s" --reason unknown   FOOT SURGERY  approx 1990   benign mass removed from bottom of foot   PFTs  01/13/2019   Minimal obstructive airway dz; no response to bronchodilator   TRANSTHORACIC ECHOCARDIOGRAM  09/17/2020   EF 60-65%, grd II DD, mild/mod MVR   Family History  Problem Relation Age of Onset   Heart disease Father    Diabetes Father    Kidney disease Father    Stroke Father    Heart disease Sister    Social History   Socioeconomic History   Marital status: Divorced    Spouse name: Not on file   Number of children: Not on file   Years of education: Not on file   Highest education level: Bachelor's degree (e.g., BA, AB, BS)  Occupational History   Not on file  Tobacco Use   Smoking status: Never   Smokeless tobacco: Never  Vaping Use   Vaping status: Never Used  Substance and Sexual Activity   Alcohol use: No   Drug use: No   Sexual activity: Not on file  Other Topics Concern   Not on file  Social History Narrative   Widow, 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   Educ: "4 yr degree"   Former occup: Architect.  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     Denies tob or alc or street drug use.   Says she was once on oxycontin for fibromyalgia pain but this was in the far remote past.   Social Determinants of Health   Financial Resource Strain: Low Risk  (08/26/2023)   Overall Financial Resource Strain (CARDIA)    Difficulty of Paying Living Expenses: Not hard at all  Food Insecurity: No Food Insecurity (08/26/2023)   Hunger Vital Sign    Worried About Running Out of Food in the Last Year: Never true    Ran Out of Food in the Last Year: Never true  Transportation Needs: No Transportation Needs  (08/26/2023)   PRAPARE - Administrator, Civil Service (Medical): No    Lack of Transportation (Non-Medical): No  Physical Activity: Inactive (08/26/2023)   Exercise Vital Sign    Days of Exercise per Week: 0 days    Minutes of Exercise per Session: 0 min  Stress: Stress Concern Present (08/26/2023)   [employer] of Occupational Health - Occupational Stress Questionnaire    Feeling of  Stress : To some extent  Social Connections: Moderately Isolated (08/26/2023)   Social Connection and Isolation Panel [NHANES]    Frequency of Communication with Friends and Family: More than three times a week    Frequency of Social Gatherings with Friends and Family: More than three times a week    Attends Religious Services: 1 to 4 times per year    Active Member of [employer] or Organizations: No    Attends Engineer, structural: Never    Marital Status: Divorced    Tobacco Counseling Counseling given: Not Answered   Clinical Intake:  Pre-visit preparation completed: Yes  Pain : No/denies pain     BMI - recorded: 26.63 Nutritional Status: BMI 25 -29 Overweight Nutritional Risks: None Diabetes: No  How often do you need to have someone help you when you read instructions, pamphlets, or other written materials from your doctor or pharmacy?: 1 - Never  Interpreter Needed?: No  Information entered by :: Lanier Ensign, LPN   Activities of Daily Living    08/26/2023    3:34 PM  In your present state of health, do you have any difficulty performing the following activities:  Hearing? 0  Vision? 0  Difficulty concentrating or making decisions? 0  Walking or climbing stairs? 0  Dressing or bathing? 0  Doing errands, shopping? 0  Preparing Food and eating ? N  Using the Toilet? N  In the past six months, have you accidently leaked urine? N  Do you have problems with loss of bowel control? N  Managing your Medications? N  Managing your Finances? N  Housekeeping or  managing your Housekeeping? N    Patient Care Team: Jeoffrey Massed, MD as PCP - General (Family Medicine) Fenton Malling, MD as Consulting Physician (Neurology) Arman Bogus, MD as Consulting Physician (Neurosurgery) Yates Decamp, MD as Consulting Physician (Cardiology)  Indicate any recent Medical Services you may have received from other than [facility] providers in the past year (date may be approximate).     Assessment:   This is a routine wellness examination for Courtney Haynes.  Hearing/Vision screen Hearing Screening - Comments:: Pt denies any hearing issues  Vision Screening - Comments:: Pt follows up with provider in [place]    Goals Addressed             This Visit's Progress    Patient Stated       Exercise a little more        Depression Screen    08/26/2023    3:39 PM 07/31/2023    1:10 PM 07/03/2023    2:02 PM 05/18/2023   10:57 AM 11/14/2022   10:20 AM 08/13/2022   10:55 AM 06/03/2022    9:45 AM  PHQ 2/9 Scores  PHQ - 2 Score 1 3 5 2 1  0 4  PHQ- 9 Score  14 15 14        Fall Risk    08/26/2023    3:42 PM 05/18/2023   10:57 AM 08/13/2022   10:55 AM 06/03/2022    9:44 AM 07/10/2021    4:01 PM  Fall Risk   Falls in the past year? 1 1 0 1 0  Number falls in past yr: 1 1 0 0 0  Injury with Fall? 0 0 0 1 0  Risk for fall due to : Impaired vision No Fall Risks History of fall(s) Impaired vision   Follow up Falls prevention discussed Falls evaluation completed Falls evaluation completed  Falls evaluation completed Falls evaluation completed;Falls prevention discussed    MEDICARE RISK AT HOME: Medicare Risk at Home Any stairs in or around the home?: Yes If so, are there any without handrails?: No Home free of loose throw rugs in walkways, pet beds, electrical cords, etc?: Yes Adequate lighting in your home to reduce risk of falls?: Yes Life alert?: No Use of a cane, walker or w/c?: No Grab bars in the bathroom?: No Shower chair or bench in shower?:  No Elevated toilet seat or a handicapped toilet?: No  TIMED UP AND GO:  Was the test performed?  No    Cognitive Function:        08/26/2023    3:43 PM 08/13/2022   11:08 AM  6CIT Screen  What Year? 0 points 0 points  What month? 0 points 0 points  What time? 0 points 0 points  Count back from 20 0 points 0 points  Months in reverse 0 points   Repeat phrase 4 points 0 points  Total Score 4 points     Immunizations Immunization History  Administered Date(s) Administered   Influenza,inj,Quad PF,6+ Mos 08/11/2019      Flu Vaccine status: Declined, Education has been provided regarding the importance of this vaccine but patient still declined. Advised may receive this vaccine at local pharmacy or Health Dept. Aware to provide a copy of the vaccination record if obtained from local pharmacy or Health Dept. Verbalized acceptance and understanding.    Covid-19 vaccine status: Declined, Education has been provided regarding the importance of this vaccine but patient still declined. Advised may receive this vaccine at local pharmacy or Health Dept.or vaccine clinic. Aware to provide a copy of the vaccination record if obtained from local pharmacy or Health Dept. Verbalized acceptance and understanding.  Qualifies for Shingles Vaccine? Yes   Zostavax completed No   Shingrix Completed?: No.    Education has been provided regarding the importance of this vaccine. Patient has been advised to call insurance company to determine out of pocket expense if they have not yet received this vaccine. Advised may also receive vaccine at local pharmacy or Health Dept. Verbalized acceptance and understanding.  Screening Tests Health Maintenance  Topic Date Due   Cervical Cancer Screening (HPV/Pap Cotest)  Never done   MAMMOGRAM  Never done   COVID-19 Vaccine (1 - 2023-24 season) Never done   Zoster Vaccines- Shingrix (1 of 2) 10/31/2023 (Originally 04/12/2018)   INFLUENZA VACCINE  03/07/2024  (Originally 07/09/2023)   Hepatitis C Screening  05/17/2024 (Originally 04/12/1986)   HIV Screening  05/17/2024 (Originally 04/13/1983)   Medicare Annual Wellness (AWV)  08/25/2024   Fecal DNA (Cologuard)  09/09/2025   HPV VACCINES  Aged Out   DTaP/Tdap/Td  Discontinued    Health Maintenance  Health Maintenance Due  Topic Date Due   Cervical Cancer Screening (HPV/Pap Cotest)  Never done   MAMMOGRAM  Never done   COVID-19 Vaccine (1 - 2023-24 season) Never done    Colorectal cancer screening: Type of screening: Cologuard. Completed 09/09/22. Repeat every 3 years  Mammogram status: Ordered 08/26/23. Pt provided with contact info and advised to call to schedule appt.     Additional Screening:  Hepatitis C Screening: does not qualify  Vision Screening: Recommended annual ophthalmology exams for early detection of glaucoma and other disorders of the eye. Is the patient up to date with their annual eye exam?  No  Who is the provider or what is the name of the  office in which the patient attends annual eye exams? Provider at [facility] If pt is not established with a provider, would they like to be referred to a provider to establish care? No .   Dental Screening: Recommended annual dental exams for proper oral hygiene   Community Resource Referral / Chronic Care Management: CRR required this visit?  No   CCM required this visit?  No     Plan:     I have personally reviewed and noted the following in the patient's chart:   Medical and social history Use of alcohol, tobacco or illicit drugs  Current medications and supplements including opioid prescriptions. Patient is not currently taking opioid prescriptions. Functional ability and status Nutritional status Physical activity Advanced directives List of other physicians Hospitalizations, surgeries, and ER visits in previous 12 months Vitals Screenings to include cognitive, depression, and falls Referrals and  appointments  In addition, I have reviewed and discussed with patient certain preventive protocols, quality metrics, and best practice recommendations. A written personalized care plan for preventive services as well as general preventive health recommendations were provided to patient.     Marzella Schlein, LPN   1/61/0960   After Visit Summary: (MyChart) Due to this being a telephonic visit, the after visit summary with patients personalized plan was offered to patient via MyChart   Nurse Notes: none

## 2023-08-26 NOTE — Patient Instructions (Signed)
Ms. Shindler , Thank you for taking time to come for your Medicare Wellness Visit. I appreciate your ongoing commitment to your health goals. Please review the following plan we discussed and let me know if I can assist you in the future.   Referrals/Orders/Follow-Ups/Clinician Recommendations: pt stated to exercise more   This is a list of the screening recommended for you and due dates:  Health Maintenance  Topic Date Due   Pap with HPV screening  Never done   Mammogram  Never done   COVID-19 Vaccine (1 - 2023-24 season) Never done   Zoster (Shingles) Vaccine (1 of 2) 10/31/2023*   Flu Shot  03/07/2024*   Hepatitis C Screening  05/17/2024*   HIV Screening  05/17/2024*   Medicare Annual Wellness Visit  08/25/2024   Cologuard (Stool DNA test)  09/09/2025   HPV Vaccine  Aged Out   DTaP/Tdap/Td vaccine  Discontinued  *Topic was postponed. The date shown is not the original due date.    Advanced directives: (Declined) Advance directive discussed with you today. Even though you declined this today, please call our office should you change your mind, and we can give you the proper paperwork for you to fill out.  Next Medicare Annual Wellness Visit scheduled for next year: Yes

## 2023-09-16 ENCOUNTER — Other Ambulatory Visit: Payer: Self-pay | Admitting: Family Medicine

## 2023-09-17 ENCOUNTER — Other Ambulatory Visit (HOSPITAL_COMMUNITY): Payer: Self-pay

## 2023-09-17 ENCOUNTER — Other Ambulatory Visit (HOSPITAL_BASED_OUTPATIENT_CLINIC_OR_DEPARTMENT_OTHER): Payer: Self-pay

## 2023-09-18 DIAGNOSIS — M797 Fibromyalgia: Secondary | ICD-10-CM | POA: Diagnosis not present

## 2023-09-18 DIAGNOSIS — G43719 Chronic migraine without aura, intractable, without status migrainosus: Secondary | ICD-10-CM | POA: Diagnosis not present

## 2023-09-22 ENCOUNTER — Telehealth: Payer: Self-pay | Admitting: Family Medicine

## 2023-09-22 NOTE — Telephone Encounter (Signed)
I reviewed her chart and she does not any medical reason that she can't serve jury duty. Tell her I'm sorry I cannot write a letter.

## 2023-09-22 NOTE — Telephone Encounter (Signed)
Patient is requesting a note to get out of jury duty due to being disabled and having headaches. She could not provide much details about what the letter needs to say. I informed her a CMA would give her a call to discuss this matter and if any documentation would need to be uploaded into mychart.

## 2023-09-22 NOTE — Telephone Encounter (Signed)
Please advise if this is ok.

## 2023-09-23 NOTE — Telephone Encounter (Signed)
Patient is aware the providers message regarding jury duty

## 2023-09-23 NOTE — Telephone Encounter (Signed)
LVM for pt to return call.   Note: pls see message below from provider

## 2023-10-11 ENCOUNTER — Other Ambulatory Visit: Payer: Self-pay | Admitting: Family Medicine

## 2023-11-05 ENCOUNTER — Other Ambulatory Visit: Payer: Self-pay | Admitting: Family Medicine

## 2023-11-17 ENCOUNTER — Ambulatory Visit: Payer: Medicare HMO | Admitting: Family Medicine

## 2023-11-17 NOTE — Progress Notes (Unsigned)
OFFICE VISIT  11/17/2023  CC: No chief complaint on file.   Patient is a 57 y.o. female who presents for 26-month follow-up anxiety, hypertension, hyperlipidemia, prediabetes, and chronic renal insufficiency stage III. A/P as of last visit: "#1 chronic anxiety. Doing well on Cymbalta 60 mg a day.   #2 hypertension, doing well on losartan 100 mg a day and Toprol-XL 100 mg a day. Electrolytes and creatinine today.   3. mixed hyperlipidemia. She has been hesitant to use cholesterol-lowering medication in the past.  Fearful of its potential side effects.  We discussed this again today and she was agreeable to ongoing monitoring, no meds at this time   #4 chronic renal insufficiency stage III. Avoiding NSAIDs.  Hydrating well. Electrolytes and creatinine today.   5.  Prediabetes.  Fasting glucose and hemoglobin A1c today.   6.  Vitamin B12 deficiency. Her B12 level 2 months ago at her neurologist was within normal limits. Continue 1000 mcg  B12 daily."  INTERIM HX: ***  Past Medical History:  Diagnosis Date   Allergic rhinitis    Cervical spondylosis 08/2020   C8 radiculopathy->Surgery 08/2020 (Dr. Yetta Barre)   Chronic renal insufficiency, stage 3 (moderate) (HCC)    GFR 50s   Colon cancer screening    09/2022 Cologuard negative.   COVID-19    12/2020   Diastolic dysfunction 09/2020 echo   grade II   Fibromyalgia    Heart defect    PFO--closure procedure approx 2009 (Dr. Jacinto Halim)   Hypercholesterolemia 08/2021   mild, frmhm cv risk 3.5%->TLC   Hypertension    Insomnia    Migraine syndrome    Mild mitral valve regurgitation 09/17/2020   09/2020 echo-->mild/mod MVR   prediab 08/2019   2020 Gluc 106->A1c 5.6%.  08/2021 Hba1c 6%.   Scoliosis    Vitamin B12 deficiency     Past Surgical History:  Procedure Laterality Date   APPENDECTOMY  6th grade   CARDIAC SURGERY     PFO closure 2009   CARDIOVASCULAR STRESS TEST  10/10/2020   chronotropic incompetence.  No ischemia,  normal LV fxn, normal wall motion, EF 70%.   CERVICAL SPINE SURGERY  remote past; also 08/2020   discectomy x 1, then discectomy with titanium fusion. 08/2020 C spine surg again (Dr. Yetta Barre)   CHOLECYSTECTOMY  prior to 2010   COLONOSCOPY     "1990s" --reason unknown   FOOT SURGERY  approx 1990   benign mass removed from bottom of foot   PFTs  01/13/2019   Minimal obstructive airway dz; no response to bronchodilator   TRANSTHORACIC ECHOCARDIOGRAM  09/17/2020   EF 60-65%, grd II DD, mild/mod MVR    Outpatient Medications Prior to Visit  Medication Sig Dispense Refill   albuterol (VENTOLIN HFA) 108 (90 Base) MCG/ACT inhaler INHALE TWO PUFFS BY MOUTH EVERY 6 HOURS AS NEEDED FOR WHEEZING OR FOR SHORTNESS OF BREATH 18 g 0   carisoprodol (SOMA) 350 MG tablet Take 350 mg by mouth 4 (four) times daily.     cetirizine (ZYRTEC) 10 MG tablet Take 10 mg by mouth daily.     DULoxetine (CYMBALTA) 60 MG capsule Take 60 mg by mouth daily.     eletriptan (RELPAX) 40 MG tablet Take by mouth.     fluticasone (FLONASE) 50 MCG/ACT nasal spray Place into both nostrils daily.     Fluticasone Furoate (ARNUITY ELLIPTA) 200 MCG/ACT AEPB Take 1 puff by mouth daily. 30 each 1   furosemide (LASIX) 20 MG tablet TAKE 1  TABLET(20 MG) BY MOUTH DAILY 90 tablet 1   losartan (COZAAR) 100 MG tablet TAKE ONE TABLET BY MOUTH ONE TIME DAILY 30 tablet 0   magnesium gluconate (MAGONATE) 500 MG tablet Take 500 mg by mouth 2 (two) times daily.     Melatonin 5 MG TABS Take 10 mg by mouth daily as needed.      metoprolol succinate (TOPROL-XL) 100 MG 24 hr tablet TAKE ONE TABLET BY MOUTH WITH OR IMMEDIATELY FOLLOWING A MEAL 30 tablet 0   naratriptan (AMERGE) 2.5 MG tablet Take 2.5 mg by mouth as needed for migraine. Take one (1) tablet at onset of headache; if returns or does not resolve, may repeat after 4 hours; do not exceed five (5) mg in 24 hours.  (Patient not taking: Reported on 08/26/2023)     pantoprazole (PROTONIX) 40 MG tablet  TAKE ONE TABLET BY MOUTH ONE TIME DAILY 30 tablet 0   zolmitriptan (ZOMIG) 5 MG tablet Take by mouth.     zolpidem (AMBIEN CR) 12.5 MG CR tablet Take 12.5 mg by mouth at bedtime as needed for sleep.     No facility-administered medications prior to visit.    No Known Allergies  Review of Systems As per HPI  PE:    08/26/2023    3:32 PM 07/31/2023    1:03 PM 07/03/2023    2:01 PM  Vitals with BMI  Height  5\' 5"  5\' 5"   Weight 160 lbs 166 lbs 3 oz 162 lbs 10 oz  BMI 26.63 27.66 27.06  Systolic  98 118  Diastolic  64 79  Pulse  70 80     Physical Exam  ***  LABS:  Last CBC Lab Results  Component Value Date   WBC 16.1 (H) 06/22/2023   HGB 13.7 06/22/2023   HCT 38.9 06/22/2023   MCV 90.7 06/22/2023   MCH 31.9 06/22/2023   RDW 12.2 06/22/2023   PLT 335 06/22/2023   Last metabolic panel Lab Results  Component Value Date   GLUCOSE 131 (H) 06/22/2023   NA 135 06/22/2023   K 3.9 06/22/2023   CL 104 06/22/2023   CO2 22 06/22/2023   BUN 16 06/22/2023   CREATININE 0.92 06/22/2023   GFRNONAA >60 06/22/2023   CALCIUM 8.8 (L) 06/22/2023   PROT 6.7 05/18/2023   ALBUMIN 4.2 05/18/2023   BILITOT 0.4 05/18/2023   ALKPHOS 128 (H) 05/18/2023   AST 11 05/18/2023   ALT 12 05/18/2023   ANIONGAP 9 06/22/2023   Last lipids Lab Results  Component Value Date   CHOL 182 05/18/2023   HDL 39.40 05/18/2023   LDLCALC 109 (H) 05/18/2023   LDLDIRECT 117 (H) 09/28/2020   TRIG 167.0 (H) 05/18/2023   CHOLHDL 5 05/18/2023   Last hemoglobin A1c Lab Results  Component Value Date   HGBA1C 5.4 05/18/2023   Last thyroid functions Lab Results  Component Value Date   TSH 3.22 05/18/2023   Last vitamin B12 and Folate Lab Results  Component Value Date   VITAMINB12 204 (L) 11/14/2022   IMPRESSION AND PLAN:  No problem-specific Assessment & Plan notes found for this encounter.  mixed hyperlipidemia. She has been hesitant to use cholesterol-lowering medication in the past.   Fearful of its potential side effects.  We discussed this again today and she was agreeable to ongoing monitoring, no meds at this time  An After Visit Summary was printed and given to the patient.  FOLLOW UP: No follow-ups on file. Next CPE 05/2024  Signed:  Santiago Bumpers, MD           11/17/2023

## 2023-11-18 ENCOUNTER — Ambulatory Visit (INDEPENDENT_AMBULATORY_CARE_PROVIDER_SITE_OTHER): Payer: Medicare HMO | Admitting: Family Medicine

## 2023-11-18 ENCOUNTER — Encounter: Payer: Self-pay | Admitting: Family Medicine

## 2023-11-18 VITALS — BP 106/73 | HR 80 | Wt 173.6 lb

## 2023-11-18 DIAGNOSIS — I10 Essential (primary) hypertension: Secondary | ICD-10-CM | POA: Diagnosis not present

## 2023-11-18 DIAGNOSIS — R7303 Prediabetes: Secondary | ICD-10-CM | POA: Diagnosis not present

## 2023-11-18 DIAGNOSIS — N182 Chronic kidney disease, stage 2 (mild): Secondary | ICD-10-CM

## 2023-11-18 DIAGNOSIS — E538 Deficiency of other specified B group vitamins: Secondary | ICD-10-CM

## 2023-11-18 DIAGNOSIS — F411 Generalized anxiety disorder: Secondary | ICD-10-CM

## 2023-11-18 DIAGNOSIS — N2889 Other specified disorders of kidney and ureter: Secondary | ICD-10-CM

## 2023-11-18 MED ORDER — PANTOPRAZOLE SODIUM 40 MG PO TBEC: DELAYED_RELEASE_TABLET | ORAL | 3 refills | Status: DC

## 2023-11-18 NOTE — Progress Notes (Signed)
OFFICE VISIT  11/18/2023  CC:  Chief Complaint  Patient presents with   Medical Management of Chronic Issues    Patient is a 57 y.o. female who presents for 6 mo f/u HTN, prediab, CRI II/III, GAD. A/P as of last visit: "chronic anxiety. Doing well on Cymbalta 60 mg a day.   #3 hypertension, doing well on losartan 100 mg a day and Toprol-XL 100 mg a day. Electrolytes and creatinine today.   4. mixed hyperlipidemia. She has been hesitant to use cholesterol-lowering medication in the past.  Fearful of its potential side effects.  We discussed this again today and she was agreeable to ongoing monitoring, no meds at this time   #5 chronic renal insufficiency stage III. Avoiding NSAIDs.  Hydrating well. Electrolytes and creatinine today.   6.  Prediabetes.  Fasting glucose and hemoglobin A1c today.   7.  Vitamin B12 deficiency. Her B12 level 2 months ago at her neurologist was within normal limits. Continue 1000 mcg  B12 daily.".  INTERIM HX: Labs excellent last visit.  A1c down to 5.4%.  Still with chronic fatigue.  She has waxing and waning intensity of this. Still with some intermittent feeling of generalized abdominal bloating as well as periods of increased lower extremity swelling.  She takes Lasix on a as needed basis.  Stress level is high lately.  Her son broke his leg playing soccer.  It is his senior year and he had to undergo surgery.  This has caused a lot of stress, disrupted sleep.  She has been taking a 1000 mcg vitamin B12 gel tab daily for approximately last month.  ROS as above, plus--> no fevers, no CP, no SOB, no wheezing, no cough, no dizziness, no HAs, no rashes, no melena/hematochezia.  No polyuria or polydipsia.  No myalgias or arthralgias.  No focal weakness, paresthesias, or tremors.  No acute vision or hearing abnormalities.  No dysuria or unusual/new urinary urgency or frequency.  No n/v/d or abd pain.  No palpitations.       Past Medical History:   Diagnosis Date   Allergic rhinitis    Cervical spondylosis 08/2020   C8 radiculopathy->Surgery 08/2020 (Dr. Yetta Barre)   Chronic renal insufficiency, stage 3 (moderate) (HCC)    GFR 50s   Colon cancer screening    09/2022 Cologuard negative.   COVID-19    12/2020   Diastolic dysfunction 09/2020 echo   grade II   Fibromyalgia    Heart defect    PFO--closure procedure approx 2009 (Dr. Jacinto Halim)   Hypercholesterolemia 08/2021   mild, frmhm cv risk 3.5%->[facility]   Hypertension    Insomnia    Migraine syndrome    Mild mitral valve regurgitation 09/17/2020   09/2020 echo-->mild/mod MVR   prediab 08/2019   2020 Gluc 106->A1c 5.6%.  08/2021 Hba1c 6%.   Scoliosis    Vitamin B12 deficiency     Past Surgical History:  Procedure Laterality Date   APPENDECTOMY  6th grade   CARDIAC SURGERY     PFO closure 2009   CARDIOVASCULAR STRESS TEST  10/10/2020   chronotropic incompetence.  No ischemia, normal LV fxn, normal wall motion, EF 70%.   CERVICAL SPINE SURGERY  remote past; also 08/2020   discectomy x 1, then discectomy with titanium fusion. 08/2020 C spine surg again (Dr. Yetta Barre)   CHOLECYSTECTOMY  prior to 2010   COLONOSCOPY     "1990s" --reason unknown   FOOT SURGERY  approx 1990   benign mass removed from bottom  of foot   PFTs  01/13/2019   Minimal obstructive airway dz; no response to bronchodilator   TRANSTHORACIC ECHOCARDIOGRAM  09/17/2020   EF 60-65%, grd II DD, mild/mod MVR    Outpatient Medications Prior to Visit  Medication Sig Dispense Refill   albuterol (VENTOLIN HFA) 108 (90 Base) MCG/ACT inhaler INHALE TWO PUFFS BY MOUTH EVERY 6 HOURS AS NEEDED FOR WHEEZING OR FOR SHORTNESS OF BREATH 18 g 0   carisoprodol (SOMA) 350 MG tablet Take 350 mg by mouth 4 (four) times daily.     cetirizine (ZYRTEC) 10 MG tablet Take 10 mg by mouth daily.     DULoxetine (CYMBALTA) 60 MG capsule Take 60 mg by mouth daily.     eletriptan (RELPAX) 40 MG tablet Take by mouth.     fluticasone (FLONASE) 50  MCG/ACT nasal spray Place into both nostrils daily.     Fluticasone Furoate (ARNUITY ELLIPTA) 200 MCG/ACT AEPB Take 1 puff by mouth daily. 30 each 1   furosemide (LASIX) 20 MG tablet TAKE 1 TABLET(20 MG) BY MOUTH DAILY 90 tablet 1   losartan (COZAAR) 100 MG tablet TAKE ONE TABLET BY MOUTH ONE TIME DAILY 30 tablet 0   magnesium gluconate (MAGONATE) 500 MG tablet Take 500 mg by mouth 2 (two) times daily.     Melatonin 5 MG TABS Take 10 mg by mouth daily as needed.      metoprolol succinate (TOPROL-XL) 100 MG 24 hr tablet TAKE ONE TABLET BY MOUTH WITH OR IMMEDIATELY FOLLOWING A MEAL 30 tablet 0   naratriptan (AMERGE) 2.5 MG tablet Take 2.5 mg by mouth as needed for migraine. Take one (1) tablet at onset of headache; if returns or does not resolve, may repeat after 4 hours; do not exceed five (5) mg in 24 hours.     zolpidem (AMBIEN CR) 12.5 MG CR tablet Take 12.5 mg by mouth at bedtime as needed for sleep.     pantoprazole (PROTONIX) 40 MG tablet TAKE ONE TABLET BY MOUTH ONE TIME DAILY 30 tablet 0   zolmitriptan (ZOMIG) 5 MG tablet Take by mouth.     No facility-administered medications prior to visit.    No Known Allergies  Review of Systems As per HPI  PE:    11/18/2023    2:36 PM 08/26/2023    3:32 PM 07/31/2023    1:03 PM  Vitals with BMI  Height   5\' 5"   Weight 173 lbs 10 oz 160 lbs 166 lbs 3 oz  BMI  26.63 27.66  Systolic 106  98  Diastolic 73  64  Pulse 80  70     Physical Exam  Gen: Alert, well appearing.  Patient is oriented to person, place, time, and situation. AFFECT: pleasant, lucid thought and speech. EXT: no edema   LABS:  Last CBC Lab Results  Component Value Date   WBC 16.1 (H) 06/22/2023   HGB 13.7 06/22/2023   HCT 38.9 06/22/2023   MCV 90.7 06/22/2023   MCH 31.9 06/22/2023   RDW 12.2 06/22/2023   PLT 335 06/22/2023   Last metabolic panel Lab Results  Component Value Date   GLUCOSE 131 (H) 06/22/2023   NA 135 06/22/2023   K 3.9 06/22/2023   CL  104 06/22/2023   CO2 22 06/22/2023   BUN 16 06/22/2023   CREATININE 0.92 06/22/2023   GFRNONAA >60 06/22/2023   CALCIUM 8.8 (L) 06/22/2023   PROT 6.7 05/18/2023   ALBUMIN 4.2 05/18/2023   BILITOT 0.4 05/18/2023  ALKPHOS 128 (H) 05/18/2023   AST 11 05/18/2023   ALT 12 05/18/2023   ANIONGAP 9 06/22/2023   Last lipids Lab Results  Component Value Date   CHOL 182 05/18/2023   HDL 39.40 05/18/2023   LDLCALC 109 (H) 05/18/2023   LDLDIRECT 117 (H) 09/28/2020   TRIG 167.0 (H) 05/18/2023   CHOLHDL 5 05/18/2023   Last hemoglobin A1c Lab Results  Component Value Date   HGBA1C 5.4 05/18/2023   Last vitamin B12 and Folate Lab Results  Component Value Date   VITAMINB12 204 (L) 11/14/2022   IMPRESSION AND PLAN:  #1 hypertension, well-controlled on Toprol-XL 100 mg a day and losartan 100 mg a day. Electrolytes and creatinine today.  2.  Prediabetes. Hemoglobin A1c today.  If normal, will resume just annual A1c checks.  3.  Chronic renal insufficiency stage II/III. Avoid NSAIDs. Electrolytes and creatinine today.  4.  Vitamin B12 deficiency. She is only been on the oral B12 supplementation (1000 mcg gel tab) for the last month or so. Monitor B12 level today.  #5 GAD. Stable long-term on duloxetine 60 mg a day.  An After Visit Summary was printed and given to the patient.  FOLLOW UP: Return in about 6 months (around 05/18/2024) for annual CPE (fasting).  Signed:  Santiago Bumpers, MD           11/18/2023

## 2023-11-19 LAB — BASIC METABOLIC PANEL
BUN: 15 mg/dL (ref 6–23)
CO2: 26 meq/L (ref 19–32)
Calcium: 9.2 mg/dL (ref 8.4–10.5)
Chloride: 106 meq/L (ref 96–112)
Creatinine, Ser: 1.01 mg/dL (ref 0.40–1.20)
GFR: 62.63 mL/min (ref >60.00)
Glucose, Bld: 100 mg/dL — ABNORMAL HIGH (ref 70–99)
Potassium: 3.9 meq/L (ref 3.5–5.1)
Sodium: 140 meq/L (ref 135–145)

## 2023-11-19 LAB — VITAMIN B12: Vitamin B-12: 616 pg/mL (ref 211–911)

## 2023-11-19 LAB — HEMOGLOBIN A1C: Hgb A1c MFr Bld: 5.9 % (ref 4.6–6.5)

## 2023-11-20 ENCOUNTER — Encounter: Payer: Self-pay | Admitting: Family Medicine

## 2024-01-22 ENCOUNTER — Other Ambulatory Visit: Payer: Self-pay | Admitting: Family Medicine

## 2024-02-22 ENCOUNTER — Other Ambulatory Visit: Payer: Self-pay | Admitting: Family Medicine

## 2024-03-22 DIAGNOSIS — G43719 Chronic migraine without aura, intractable, without status migrainosus: Secondary | ICD-10-CM | POA: Diagnosis not present

## 2024-03-22 DIAGNOSIS — M797 Fibromyalgia: Secondary | ICD-10-CM | POA: Diagnosis not present

## 2024-04-26 ENCOUNTER — Other Ambulatory Visit (HOSPITAL_BASED_OUTPATIENT_CLINIC_OR_DEPARTMENT_OTHER): Payer: Self-pay

## 2024-04-26 ENCOUNTER — Other Ambulatory Visit: Payer: Self-pay | Admitting: Family Medicine

## 2024-04-26 MED ORDER — ARNUITY ELLIPTA 200 MCG/ACT IN AEPB
1.0000 | INHALATION_SPRAY | Freq: Every day | RESPIRATORY_TRACT | 1 refills | Status: DC
  Filled 2024-04-26 – 2024-05-16 (×2): qty 30, 30d supply, fill #0
  Filled 2024-08-24: qty 30, 30d supply, fill #1

## 2024-04-30 ENCOUNTER — Other Ambulatory Visit (HOSPITAL_COMMUNITY): Payer: Self-pay

## 2024-05-09 ENCOUNTER — Other Ambulatory Visit (HOSPITAL_BASED_OUTPATIENT_CLINIC_OR_DEPARTMENT_OTHER): Payer: Self-pay

## 2024-05-16 ENCOUNTER — Other Ambulatory Visit (HOSPITAL_BASED_OUTPATIENT_CLINIC_OR_DEPARTMENT_OTHER): Payer: Self-pay

## 2024-05-16 ENCOUNTER — Other Ambulatory Visit (HOSPITAL_COMMUNITY): Payer: Self-pay

## 2024-05-17 ENCOUNTER — Other Ambulatory Visit (HOSPITAL_BASED_OUTPATIENT_CLINIC_OR_DEPARTMENT_OTHER): Payer: Self-pay

## 2024-05-18 ENCOUNTER — Encounter: Payer: Self-pay | Admitting: Family Medicine

## 2024-05-18 ENCOUNTER — Other Ambulatory Visit: Payer: Self-pay | Admitting: Family Medicine

## 2024-05-18 ENCOUNTER — Ambulatory Visit (INDEPENDENT_AMBULATORY_CARE_PROVIDER_SITE_OTHER): Payer: Medicare HMO | Admitting: Family Medicine

## 2024-05-18 VITALS — BP 128/84 | HR 75 | Temp 98.7°F | Ht 65.5 in | Wt 168.8 lb

## 2024-05-18 DIAGNOSIS — Z0000 Encounter for general adult medical examination without abnormal findings: Secondary | ICD-10-CM | POA: Diagnosis not present

## 2024-05-18 DIAGNOSIS — Z124 Encounter for screening for malignant neoplasm of cervix: Secondary | ICD-10-CM

## 2024-05-18 DIAGNOSIS — I10 Essential (primary) hypertension: Secondary | ICD-10-CM | POA: Diagnosis not present

## 2024-05-18 DIAGNOSIS — R7303 Prediabetes: Secondary | ICD-10-CM

## 2024-05-18 DIAGNOSIS — E7800 Pure hypercholesterolemia, unspecified: Secondary | ICD-10-CM | POA: Diagnosis not present

## 2024-05-18 DIAGNOSIS — Z1231 Encounter for screening mammogram for malignant neoplasm of breast: Secondary | ICD-10-CM | POA: Diagnosis not present

## 2024-05-18 DIAGNOSIS — Z23 Encounter for immunization: Secondary | ICD-10-CM

## 2024-05-18 MED ORDER — LOSARTAN POTASSIUM 100 MG PO TABS: 100.0000 mg | ORAL_TABLET | Freq: Every day | ORAL | 1 refills | Status: DC

## 2024-05-18 MED ORDER — AMOXICILLIN 875 MG PO TABS
875.0000 mg | ORAL_TABLET | Freq: Two times a day (BID) | ORAL | 0 refills | Status: AC
Start: 2024-05-18 — End: 2024-05-28

## 2024-05-18 MED ORDER — METOPROLOL SUCCINATE ER 100 MG PO TB24: ORAL_TABLET | ORAL | 1 refills | Status: DC

## 2024-05-18 MED ORDER — FUROSEMIDE 20 MG PO TABS: ORAL_TABLET | ORAL | 1 refills | Status: DC

## 2024-05-18 NOTE — Progress Notes (Signed)
 Office Note 05/18/2024  CC:  Chief Complaint  Patient presents with   Annual Exam    Pt is not fasting   Patient is a 58 y.o. female who is here for annual health maintenance exam and follow-up hypertension, prediabetes, and chronic renal insufficiency. A/P as of last visit: #1 hypertension, well-controlled on Toprol -XL 100 mg a day and losartan  100 mg a day. Electrolytes and creatinine today.   2.  Prediabetes. Hemoglobin A1c today.  If normal, will resume just annual A1c checks.   3.  Chronic renal insufficiency stage II/III. Avoid NSAIDs. Electrolytes and creatinine today.   4.  Vitamin B12 deficiency. She is only been on the oral B12 supplementation (1000 mcg gel tab) for the last month or so. Monitor B12 level today.   #5 GAD. Stable long-term on duloxetine 60 mg a day.  INTERIM HX: Courtney Haynes is doing pretty well. She is spending a lot of time caring for her mother as well as a next-door neighbor who is in a lot of need. She has chronic fatigue, no recent acute changes.  She does not have time to do any exercise.  She does not get a lot of restful sleep.  She does take Ambien every night.  Her son graduated from high school yesterday.  Unfortunately, due to a broken leg last year he did not get to play any more soccer.  For the last 2 weeks she has had significant nasal congestion and mucus.  Lately she has begun to feel significant pressure in the face and over the eyebrows, particularly when she leans forward.  Only occasional cough.  No fever or shortness of breath.  Blood pressures average 130/80 or better at home.  PMP AWARE reviewed today: most recent rx for Ambien was filled 05/09/24, # 30, rx by me. No red flags.   Past Medical History:  Diagnosis Date   Allergic rhinitis    Cervical spondylosis 08/2020   C8 radiculopathy->Surgery 08/2020 (Dr. Rochelle Chu)   Chronic renal insufficiency, stage 3 (moderate) (HCC)    GFR 50s   Colon cancer screening    09/2022  Cologuard negative.   COVID-19    12/2020   Diastolic dysfunction 09/2020 echo   grade II   Fibromyalgia    Heart defect    PFO--closure procedure approx 2009 (Dr. Berry Bristol)   Hypercholesterolemia 08/2021   mild, frmhm cv risk 3.5%->[facility]   Hypertension    Insomnia    Migraine syndrome    Mild mitral valve regurgitation 09/17/2020   09/2020 echo-->mild/mod MVR   prediab 08/2019   2020 Gluc 106->A1c 5.6%.  08/2021 Hba1c 6%.   Scoliosis    Vitamin B12 deficiency     Past Surgical History:  Procedure Laterality Date   APPENDECTOMY  6th grade   CARDIAC SURGERY     PFO closure 2009   CARDIOVASCULAR STRESS TEST  10/10/2020   chronotropic incompetence.  No ischemia, normal LV fxn, normal wall motion, EF 70%.   CERVICAL SPINE SURGERY  remote past; also 08/2020   discectomy x 1, then discectomy with titanium fusion. 08/2020 C spine surg again (Dr. Rochelle Chu)   CHOLECYSTECTOMY  prior to 2010   COLONOSCOPY     1990s --reason unknown   FOOT SURGERY  approx 1990   benign mass removed from bottom of foot   PFTs  01/13/2019   Minimal obstructive airway dz; no response to bronchodilator   TRANSTHORACIC ECHOCARDIOGRAM  09/17/2020   EF 60-65%, grd II DD, mild/mod MVR  Family History  Problem Relation Age of Onset   Osteoarthritis Mother    Heart disease Father    Diabetes Father    Kidney disease Father    Stroke Father    Heart disease Sister     Social History   Socioeconomic History   Marital status: Divorced    Spouse name: Not on file   Number of children: Not on file   Years of education: Not on file   Highest education level: Bachelor's degree (e.g., BA, AB, BS)  Occupational History   Not on file  Tobacco Use   Smoking status: Never   Smokeless tobacco: Never  Vaping Use   Vaping status: Never Used  Substance and Sexual Activity   Alcohol use: No   Drug use: No   Sexual activity: Not on file  Other Topics Concern   Not on file  Social History Narrative   Widow,  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   Educ: 4 yr degree   Former occup: Architect.  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     Denies tob or alc or street drug use.   Says she was once on oxycontin  for fibromyalgia pain but this was in the far remote past.   Social Drivers of Health   Financial Resource Strain: Low Risk  (08/26/2023)   Overall Financial Resource Strain (CARDIA)    Difficulty of Paying Living Expenses: Not hard at all  Food Insecurity: No Food Insecurity (08/26/2023)   Hunger Vital Sign    Worried About Running Out of Food in the Last Year: Never true    Ran Out of Food in the Last Year: Never true  Transportation Needs: No Transportation Needs (08/26/2023)   PRAPARE - Administrator, Civil Service (Medical): No    Lack of Transportation (Non-Medical): No  Physical Activity: Inactive (08/26/2023)   Exercise Vital Sign    Days of Exercise per Week: 0 days    Minutes of Exercise per Session: 0 min  Stress: Stress Concern Present (08/26/2023)   [employer] of Occupational Health - Occupational Stress Questionnaire    Feeling of Stress : To some extent  Social Connections: Moderately Isolated (08/26/2023)   Social Connection and Isolation Panel [NHANES]    Frequency of Communication with Friends and Family: More than three times a week    Frequency of Social Gatherings with Friends and Family: More than three times a week    Attends Religious Services: 1 to 4 times per year    Active Member of [employer] or Organizations: No    Attends Banker Meetings: Never    Marital Status: Divorced  Catering manager Violence: Not At Risk (08/26/2023)   Humiliation, Afraid, Rape, and Kick questionnaire    Fear of Current or Ex-Partner: No    Emotionally Abused: No    Physically Abused: No    Sexually Abused: No    Outpatient Medications Prior to Visit  Medication Sig Dispense Refill   albuterol  (VENTOLIN  HFA) 108 (90 Base)  MCG/ACT inhaler INHALE TWO PUFFS BY MOUTH EVERY 6 HOURS AS NEEDED FOR WHEEZING OR FOR SHORTNESS OF BREATH 18 g 0   carisoprodol (SOMA) 350 MG tablet Take 350 mg by mouth 4 (four) times daily.     cetirizine (ZYRTEC) 10 MG tablet Take 10 mg by mouth daily.     DULoxetine (CYMBALTA) 60 MG capsule Take 60 mg by mouth daily.     eletriptan (  RELPAX) 40 MG tablet Take by mouth.     fluticasone  (FLONASE ) 50 MCG/ACT nasal spray Place into both nostrils daily.     Fluticasone  Furoate (ARNUITY ELLIPTA ) 200 MCG/ACT AEPB Take 1 puff by mouth daily. 30 each 1   magnesium gluconate (MAGONATE) 500 MG tablet Take 500 mg by mouth 2 (two) times daily.     Melatonin 5 MG TABS Take 10 mg by mouth daily as needed.      naratriptan (AMERGE) 2.5 MG tablet Take 2.5 mg by mouth as needed for migraine. Take one (1) tablet at onset of headache; if returns or does not resolve, may repeat after 4 hours; do not exceed five (5) mg in 24 hours.     pantoprazole  (PROTONIX ) 40 MG tablet TAKE ONE TABLET BY MOUTH ONE TIME DAILY 90 tablet 3   zolpidem (AMBIEN CR) 12.5 MG CR tablet Take 12.5 mg by mouth at bedtime as needed for sleep.     zonisamide (ZONEGRAN) 100 MG capsule Take 100 mg by mouth 2 (two) times daily.     zolmitriptan (ZOMIG) 5 MG tablet Take by mouth.     furosemide  (LASIX ) 20 MG tablet TAKE 1 TABLET(20 MG) BY MOUTH DAILY 90 tablet 1   losartan  (COZAAR ) 100 MG tablet TAKE ONE TABLET BY MOUTH ONE TIME DAILY 30 tablet 0   metoprolol  succinate (TOPROL -XL) 100 MG 24 hr tablet TAKE ONE TABLET BY MOUTH ONE TIME DAILY WITH OR IMMEDIATELY FOLLOWING A MEAL 90 tablet 0   No facility-administered medications prior to visit.    No Known Allergies  Review of Systems  Constitutional:  Positive for fatigue (chronic). Negative for appetite change, chills and fever.  HENT:  Negative for congestion, dental problem, ear pain and sore throat.   Eyes:  Negative for discharge, redness and visual disturbance.  Respiratory:  Negative  for cough, chest tightness, shortness of breath and wheezing.   Cardiovascular:  Negative for chest pain, palpitations and leg swelling.  Gastrointestinal:  Negative for abdominal pain, blood in stool, diarrhea, nausea and vomiting.  Genitourinary:  Negative for difficulty urinating, dysuria, flank pain, frequency, hematuria and urgency.  Musculoskeletal:  Negative for arthralgias, back pain, joint swelling, myalgias and neck stiffness.  Skin:  Negative for pallor and rash.  Neurological:  Negative for dizziness, speech difficulty, weakness and headaches.  Hematological:  Negative for adenopathy. Does not bruise/bleed easily.  Psychiatric/Behavioral:  Negative for confusion and sleep disturbance. The patient is not nervous/anxious.     PE;    05/18/2024    2:08 PM 11/18/2023    2:36 PM 08/26/2023    3:32 PM  Vitals with BMI  Height 5' 5.5    Weight 168 lbs 13 oz 173 lbs 10 oz 160 lbs  BMI 27.65  26.63  Systolic 128 106   Diastolic 84 73   Pulse 75 80    Gen: Alert, well appearing.  Patient is oriented to person, place, time, and situation. AFFECT: pleasant, lucid thought and speech. ENT: Ears: EACs clear, normal epithelium.  TMs with good light reflex and landmarks bilaterally.  Eyes: no injection, icteris, swelling, or exudate.  EOMI, PERRLA. Nose: no drainage or turbinate edema/swelling.  No injection or focal lesion.  Mouth: lips without lesion/swelling.  Oral mucosa pink and moist.  Dentition intact and without obvious caries or gingival swelling.  Oropharynx without erythema, exudate, or swelling.  Neck: supple/nontender.  No LAD, mass, or TM.  Carotid pulses 2+ bilaterally, without bruits. CV: RRR, no m/r/g.  LUNGS: CTA bilat, nonlabored resps, good aeration in all lung fields. ABD: soft, NT, ND, BS normal.  No hepatospenomegaly or mass.  No bruits. EXT: no clubbing, cyanosis, or edema.  Musculoskeletal: no joint swelling, erythema, warmth, or tenderness.  ROM of all joints  intact. Skin - no sores or suspicious lesions or rashes or color changes  Pertinent labs:  Lab Results  Component Value Date   TSH 3.22 05/18/2023   Lab Results  Component Value Date   WBC 16.1 (H) 06/22/2023   HGB 13.7 06/22/2023   HCT 38.9 06/22/2023   MCV 90.7 06/22/2023   PLT 335 06/22/2023   Lab Results  Component Value Date   CREATININE 1.01 11/18/2023   BUN 15 11/18/2023   NA 140 11/18/2023   K 3.9 11/18/2023   CL 106 11/18/2023   CO2 26 11/18/2023   Lab Results  Component Value Date   ALT 12 05/18/2023   AST 11 05/18/2023   ALKPHOS 128 (H) 05/18/2023   BILITOT 0.4 05/18/2023   Lab Results  Component Value Date   CHOL 182 05/18/2023   Lab Results  Component Value Date   HDL 39.40 05/18/2023   Lab Results  Component Value Date   LDLCALC 109 (H) 05/18/2023   Lab Results  Component Value Date   TRIG 167.0 (H) 05/18/2023   Lab Results  Component Value Date   CHOLHDL 5 05/18/2023   Lab Results  Component Value Date   HGBA1C 5.9 11/18/2023   Lab Results  Component Value Date   VITAMINB12 616 11/18/2023   ASSESSMENT AND PLAN:   #1 health maintenance exam: Reviewed age and gender appropriate health maintenance issues (prudent diet, regular exercise, health risks of tobacco and excessive alcohol, use of seatbelts, fire alarms in home, use of sunscreen).  Also reviewed age and gender appropriate health screening as well as vaccine recommendations. Vaccines: declined prevnar. Labs: fasting HP +Hba1c (prediabetes). Breast ca screening: mammogram ordered today. GYN ref ordered again today for cervical cancer. Colon ca screening: rpt cologuard 09/2025.  #2 hypertension, well-controlled on Toprol -XL 100 mg a day and losartan  100 mg a day. Electrolytes and creatinine today.   3.  Prediabetes. Hemoglobin A1c today.    4.  Chronic renal insufficiency stage II/III. Avoid NSAIDs. Electrolytes and creatinine today.   5.  Vitamin B12 deficiency. Her  level came up to 616 on recheck 6 months ago.  Continue 1000 mcg B12 supplement.   #6 GAD and anxiety-related insomnia. Stable long-term on duloxetine 60 mg a day and Ambien CR 12.5 mg nightly.  #7 acute sinusitis. Amoxicillin  875 twice daily x 10 days.  An After Visit Summary was printed and given to the patient.  FOLLOW UP:  Return in about 6 months (around 11/17/2024) for routine chronic illness f/u.  Signed:  Arletha Lady, MD           05/18/2024

## 2024-05-18 NOTE — Patient Instructions (Signed)

## 2024-05-19 ENCOUNTER — Ambulatory Visit: Payer: Self-pay | Admitting: Family Medicine

## 2024-05-19 LAB — CBC WITH DIFFERENTIAL/PLATELET
Absolute Lymphocytes: 1822 {cells}/uL (ref 850–3900)
Absolute Monocytes: 607 {cells}/uL (ref 200–950)
Basophils Absolute: 28 {cells}/uL (ref 0–200)
Basophils Relative: 0.4 %
Eosinophils Absolute: 179 {cells}/uL (ref 15–500)
Eosinophils Relative: 2.6 %
HCT: 41.1 % (ref 35.0–45.0)
Hemoglobin: 13.6 g/dL (ref 11.7–15.5)
MCH: 31.3 pg (ref 27.0–33.0)
MCHC: 33.1 g/dL (ref 32.0–36.0)
MCV: 94.5 fL (ref 80.0–100.0)
MPV: 10 fL (ref 7.5–12.5)
Monocytes Relative: 8.8 %
Neutro Abs: 4264 {cells}/uL (ref 1500–7800)
Neutrophils Relative %: 61.8 %
Platelets: 344 10*3/uL (ref 140–400)
RBC: 4.35 10*6/uL (ref 3.80–5.10)
RDW: 12.7 % (ref 11.0–15.0)
Total Lymphocyte: 26.4 %
WBC: 6.9 10*3/uL (ref 3.8–10.8)

## 2024-05-19 LAB — LIPID PANEL
Cholesterol: 218 mg/dL — ABNORMAL HIGH (ref <200)
HDL: 39 mg/dL — ABNORMAL LOW (ref >=50)
LDL Cholesterol (Calc): 148 mg/dL — ABNORMAL HIGH
Non-HDL Cholesterol (Calc): 179 mg/dL — ABNORMAL HIGH (ref <130)
Total CHOL/HDL Ratio: 5.6 (calc) — ABNORMAL HIGH (ref <5.0)
Triglycerides: 174 mg/dL — ABNORMAL HIGH (ref <150)

## 2024-05-19 LAB — COMPREHENSIVE METABOLIC PANEL WITH GFR
AG Ratio: 1.6 (calc) (ref 1.0–2.5)
ALT: 14 U/L (ref 6–29)
AST: 10 U/L (ref 10–35)
Albumin: 4.2 g/dL (ref 3.6–5.1)
Alkaline phosphatase (APISO): 131 U/L (ref 37–153)
BUN: 13 mg/dL (ref 7–25)
CO2: 25 mmol/L (ref 20–32)
Calcium: 9.8 mg/dL (ref 8.6–10.4)
Chloride: 104 mmol/L (ref 98–110)
Creat: 0.92 mg/dL (ref 0.50–1.03)
Globulin: 2.6 g/dL (ref 1.9–3.7)
Glucose, Bld: 116 mg/dL — ABNORMAL HIGH (ref 65–99)
Potassium: 4.2 mmol/L (ref 3.5–5.3)
Sodium: 140 mmol/L (ref 135–146)
Total Bilirubin: 0.4 mg/dL (ref 0.2–1.2)
Total Protein: 6.8 g/dL (ref 6.1–8.1)
eGFR: 73 mL/min/{1.73_m2} (ref >=60)

## 2024-05-19 LAB — TSH: TSH: 1.73 m[IU]/L (ref 0.40–4.50)

## 2024-05-19 LAB — HEMOGLOBIN A1C
Hgb A1c MFr Bld: 5.9 % — ABNORMAL HIGH (ref <5.7)
Mean Plasma Glucose: 123 mg/dL
eAG (mmol/L): 6.8 mmol/L

## 2024-06-17 ENCOUNTER — Ambulatory Visit
Admission: EM | Admit: 2024-06-17 | Discharge: 2024-06-17 | Disposition: A | Discharge status: Home / Self Care | Attending: Family Medicine | Admitting: Family Medicine

## 2024-06-17 DIAGNOSIS — L02411 Cutaneous abscess of right axilla: Secondary | ICD-10-CM | POA: Diagnosis not present

## 2024-06-17 DIAGNOSIS — L739 Follicular disorder, unspecified: Secondary | ICD-10-CM

## 2024-06-17 MED ORDER — DOXYCYCLINE HYCLATE 100 MG PO CAPS: 100.0000 mg | ORAL_CAPSULE | Freq: Two times a day (BID) | ORAL | 0 refills | Status: AC

## 2024-06-17 NOTE — Discharge Instructions (Addendum)
 Advised patient to take medication as directed with food to completion.  Encouraged to increase daily water intake to 64 ounces per day while taking this medication.  Advised if symptoms worsen and/or unresolved please follow-up with your PCP or here for further evaluation.

## 2024-06-17 NOTE — ED Provider Notes (Signed)
 [facility]    CSN: [id number] Arrival date & time: 06/17/24  1712      History   Chief Complaint Chief Complaint  Patient presents with   Abscess    HPI Courtney RADZIEWICZ is a 58 y.o. female.   HPI Very pleasant 58 year old female presents with painful abscess of right axilla for 1 week.  PMH significant for fibromyalgia, migraines, and asthma.  Patient is accompanied by her son this evening.  Past Medical History:  Diagnosis Date   Allergic rhinitis    Cervical spondylosis 08/2020   C8 radiculopathy->Surgery 08/2020 (Dr. Joshua)   Chronic renal insufficiency, stage 3 (moderate) (HCC)    GFR 50s   Colon cancer screening    09/2022 Cologuard negative.   COVID-19    12/2020   Diastolic dysfunction 09/2020 echo   grade II   Fibromyalgia    Heart defect    PFO--closure procedure approx 2009 (Dr. Ladona)   Hypercholesterolemia 08/2021   mild, frmhm cv risk 3.5%->TLC   Hypertension    Insomnia    Migraine syndrome    Mild mitral valve regurgitation 09/17/2020   09/2020 echo-->mild/mod MVR   prediab 08/2019   2020 Gluc 106->A1c 5.6%.  08/2021 Hba1c 6%.   Scoliosis    Vitamin B12 deficiency     Patient Active Problem List   Diagnosis Date Noted   Radiculopathy, cervicothoracic region 07/31/2020   Fibromyalgia 03/25/2012   Essential hypertension 01/30/2009   Migraine, unspecified, not intractable, without status migrainosus 01/30/2009   Myalgia and myositis, unspecified 01/30/2009   Neuromyositis 01/30/2009   Thrombophlebitis of superficial veins of lower extremity 01/30/2009    Past Surgical History:  Procedure Laterality Date   APPENDECTOMY  6th grade   CARDIAC SURGERY     PFO closure 2009   CARDIOVASCULAR STRESS TEST  10/10/2020   chronotropic incompetence.  No ischemia, normal LV fxn, normal wall motion, EF 70%.   CERVICAL SPINE SURGERY  remote past; also 08/2020   discectomy x 1, then discectomy with titanium fusion. 08/2020 C spine surg again (Dr.  Joshua)   CHOLECYSTECTOMY  prior to 2010   COLONOSCOPY     1990s --reason unknown   FOOT SURGERY  approx 1990   benign mass removed from bottom of foot   PFTs  01/13/2019   Minimal obstructive airway dz; no response to bronchodilator   TRANSTHORACIC ECHOCARDIOGRAM  09/17/2020   EF 60-65%, grd II DD, mild/mod MVR    OB History   No obstetric history on file.      Home Medications    Prior to Admission medications   Medication Sig Start Date End Date Taking? Authorizing Provider  albuterol  (VENTOLIN  HFA) 108 (90 Base) MCG/ACT inhaler INHALE TWO PUFFS BY MOUTH EVERY 6 HOURS AS NEEDED FOR WHEEZING OR FOR SHORTNESS OF BREATH 09/16/23   McGowen, Aleene DEL, MD  carisoprodol (SOMA) 350 MG tablet Take 350 mg by mouth 4 (four) times daily. 11/15/21   [provider]  cetirizine (ZYRTEC) 10 MG tablet Take 10 mg by mouth daily.    [provider]  doxycycline  (VIBRAMYCIN ) 100 MG capsule Take 1 capsule (100 mg total) by mouth 2 (two) times daily for 10 days. 06/17/24 06/27/24 Yes Teddy Sharper, FNP  DULoxetine (CYMBALTA) 60 MG capsule Take 60 mg by mouth daily. 11/13/22   [provider]  eletriptan (RELPAX) 40 MG tablet Take by mouth. 02/26/22   [provider]  fluticasone  (FLONASE ) 50 MCG/ACT nasal spray Place into both  nostrils daily.    [provider]  Fluticasone  Furoate (ARNUITY ELLIPTA ) 200 MCG/ACT AEPB Take 1 puff by mouth daily. 04/26/24   McGowen, Aleene DEL, MD  furosemide  (LASIX ) 20 MG tablet TAKE 1 TABLET(20 MG) BY MOUTH DAILY 05/18/24   McGowen, Aleene DEL, MD  losartan  (COZAAR ) 100 MG tablet Take 1 tablet (100 mg total) by mouth daily. 05/18/24   McGowen, Aleene DEL, MD  magnesium gluconate (MAGONATE) 500 MG tablet Take 500 mg by mouth 2 (two) times daily.    [provider]  Melatonin 5 MG TABS Take 10 mg by mouth daily as needed.     [provider]  metoprolol  succinate (TOPROL -XL) 100 MG 24 hr tablet TAKE ONE TABLET BY MOUTH  ONE TIME DAILY WITH OR IMMEDIATELY FOLLOWING A MEAL 05/18/24   McGowen, Aleene DEL, MD  naratriptan (AMERGE) 2.5 MG tablet Take 2.5 mg by mouth as needed for migraine. Take one (1) tablet at onset of headache; if returns or does not resolve, may repeat after 4 hours; do not exceed five (5) mg in 24 hours.    [provider]  pantoprazole  (PROTONIX ) 40 MG tablet TAKE ONE TABLET BY MOUTH ONE TIME DAILY 11/18/23   McGowen, Aleene DEL, MD  zolmitriptan (ZOMIG) 5 MG tablet Take by mouth. 08/15/21 08/15/22  [provider]  zolpidem (AMBIEN CR) 12.5 MG CR tablet Take 12.5 mg by mouth at bedtime as needed for sleep.    [provider]  zonisamide (ZONEGRAN) 100 MG capsule Take 100 mg by mouth 2 (two) times daily. 04/27/24   [provider]    Family History Family History  Problem Relation Age of Onset   Osteoarthritis Mother    Heart disease Father    Diabetes Father    Kidney disease Father    Stroke Father    Heart disease Sister     Social History Social History   Tobacco Use   Smoking status: Never   Smokeless tobacco: Never  Vaping Use   Vaping status: Never Used  Substance Use Topics   Alcohol use: No   Drug use: No     Allergies   Patient has no known allergies.   Review of Systems Review of Systems  Skin:        Infected hair follicle/abscess of right underarm for 1 week  All other systems reviewed and are negative.    Physical Exam Triage Vital Signs ED Triage Vitals  Encounter Vitals Group     BP      Girls Systolic BP Percentile      Girls Diastolic BP Percentile      Boys Systolic BP Percentile      Boys Diastolic BP Percentile      Pulse      Resp      Temp      Temp src      SpO2      Weight      Height      Head Circumference      Peak Flow      Pain Score      Pain Loc      Pain Education      Exclude from Growth Chart    No data found.  Updated Vital Signs BP 127/82   Pulse 94   Temp 99.5 F (37.5 C)    Resp 19   SpO2 98%    Physical Exam Vitals and nursing note reviewed.  Constitutional:  Appearance: Normal appearance. She is normal weight.  HENT:     Head: Normocephalic and atraumatic.     Mouth/Throat:     Mouth: Mucous membranes are moist.     Pharynx: Oropharynx is clear.  Eyes:     Extraocular Movements: Extraocular movements intact.     Conjunctiva/sclera: Conjunctivae normal.     Pupils: Pupils are equal, round, and reactive to light.  Cardiovascular:     Rate and Rhythm: Normal rate and regular rhythm.     Pulses: Normal pulses.     Heart sounds: Normal heart sounds.  Pulmonary:     Effort: Pulmonary effort is normal.     Breath sounds: Normal breath sounds. No wheezing, rhonchi or rales.  Musculoskeletal:        General: Normal range of motion.     Cervical back: Normal range of motion and neck supple.  Skin:    General: Skin is warm and dry.     Comments: Right axilla: Erythematous hair follicle noted with mildly erythematous area noted please see image below  Neurological:     General: No focal deficit present.     Mental Status: She is alert and oriented to person, place, and time. Mental status is at baseline.  Psychiatric:        Mood and Affect: Mood normal.        Behavior: Behavior normal.      UC Treatments / Results  Labs (all labs ordered are listed, but only abnormal results are displayed) Labs Reviewed - No data to display  EKG   Radiology No results found.  Procedures Procedures (including critical care time)  Medications Ordered in UC Medications - No data to display  Initial Impression / Assessment and Plan / UC Course  I have reviewed the triage vital signs and the nursing notes.  Pertinent labs & imaging results that were available during my care of the patient were reviewed by me and considered in my medical decision making (see chart for details).     MDM: 1.  Folliculitis-Rx'd doxycycline  100 mg capsule: Take 1  capsule twice daily x 10 days; 2.  Abscess of axilla, right-Rx'd doxycycline  100 mg capsule: Take 1 capsule twice daily x 10 days. Advised patient to take medication as directed with food to completion.  Encouraged to increase daily water intake to 64 ounces per day while taking this medication.  Advised if symptoms worsen and/or unresolved please follow-up with your PCP or here for further evaluation.  Patient discharged home, hemodynamically stable. Final Clinical Impressions(s) / UC Diagnoses   Final diagnoses:  Abscess of axilla, right  Folliculitis     Discharge Instructions      Advised patient to take medication as directed with food to completion.  Encouraged to increase daily water intake to 64 ounces per day while taking this medication.  Advised if symptoms worsen and/or unresolved please follow-up with your PCP or here for further evaluation.     ED Prescriptions     Medication Sig Dispense Auth. Provider   doxycycline  (VIBRAMYCIN ) 100 MG capsule Take 1 capsule (100 mg total) by mouth 2 (two) times daily for 10 days. 20 capsule Kachina Niederer, FNP      PDMP not reviewed this encounter.   Teddy Sharper, FNP 06/17/24 [phone]

## 2024-06-17 NOTE — ED Triage Notes (Signed)
 Pt presents to [facility] with co right axilla abscess and pain for 1 week.

## 2024-08-24 ENCOUNTER — Other Ambulatory Visit (HOSPITAL_BASED_OUTPATIENT_CLINIC_OR_DEPARTMENT_OTHER): Payer: Self-pay

## 2024-09-03 ENCOUNTER — Other Ambulatory Visit: Payer: Self-pay | Admitting: Family Medicine

## 2024-09-23 DIAGNOSIS — M542 Cervicalgia: Secondary | ICD-10-CM | POA: Diagnosis not present

## 2024-09-23 DIAGNOSIS — M797 Fibromyalgia: Secondary | ICD-10-CM | POA: Diagnosis not present

## 2024-09-23 DIAGNOSIS — G43719 Chronic migraine without aura, intractable, without status migrainosus: Secondary | ICD-10-CM | POA: Diagnosis not present

## 2024-09-23 DIAGNOSIS — Z981 Arthrodesis status: Secondary | ICD-10-CM | POA: Diagnosis not present

## 2024-09-23 DIAGNOSIS — M50321 Other cervical disc degeneration at C4-C5 level: Secondary | ICD-10-CM | POA: Diagnosis not present

## 2024-09-23 DIAGNOSIS — M4312 Spondylolisthesis, cervical region: Secondary | ICD-10-CM | POA: Diagnosis not present

## 2024-11-08 ENCOUNTER — Other Ambulatory Visit: Payer: Self-pay | Admitting: Family Medicine

## 2024-11-17 ENCOUNTER — Ambulatory Visit: Admitting: Family Medicine

## 2024-11-17 ENCOUNTER — Encounter: Payer: Self-pay | Admitting: Family Medicine

## 2024-11-17 VITALS — BP 105/73 | HR 74 | Temp 98.0°F | Wt 175.6 lb

## 2024-11-17 DIAGNOSIS — I10 Essential (primary) hypertension: Secondary | ICD-10-CM

## 2024-11-17 DIAGNOSIS — R7303 Prediabetes: Secondary | ICD-10-CM | POA: Diagnosis not present

## 2024-11-17 DIAGNOSIS — F411 Generalized anxiety disorder: Secondary | ICD-10-CM | POA: Diagnosis not present

## 2024-11-17 DIAGNOSIS — R5382 Chronic fatigue, unspecified: Secondary | ICD-10-CM | POA: Diagnosis not present

## 2024-11-17 DIAGNOSIS — E538 Deficiency of other specified B group vitamins: Secondary | ICD-10-CM | POA: Diagnosis not present

## 2024-11-17 DIAGNOSIS — G4719 Other hypersomnia: Secondary | ICD-10-CM

## 2024-11-17 MED ORDER — FUROSEMIDE 20 MG PO TABS: 20.0000 mg | ORAL_TABLET | ORAL | 3 refills | Status: DC | PRN

## 2024-11-17 NOTE — Patient Instructions (Signed)
° °  Don't forget to schedule your mammogram at [facility]

## 2024-11-17 NOTE — Progress Notes (Signed)
 OFFICE VISIT  11/17/2024  CC:  Chief Complaint  Patient presents with   Medical Management of Chronic Issues    Patient is a 58 y.o. female who presents for 47-month follow-up hypertension, prediabetes, chronic renal insufficiency, and GAD with insomnia. A/P as of last visit: 1 hypertension, well-controlled on Toprol -XL 100 mg a day and losartan  100 mg a day. Electrolytes and creatinine today.   2.  Prediabetes. Hemoglobin A1c today.    3.  Chronic renal insufficiency stage II/III. Avoid NSAIDs. Electrolytes and creatinine today.   4.  Vitamin B12 deficiency. Her level came up to 616 on recheck 6 months ago.  Continue 1000 mcg B12 supplement.   #5 GAD and anxiety-related insomnia. Stable long-term on duloxetine 60 mg a day and Ambien CR 12.5 mg nightly.  INTERIM HX: Chronic fatigue and excessive daytime sleepiness are her primary concerns. She describes difficulty falling to sleep in the evening unless she takes her Ambien.  She says her sleep is not restorative and she feels like she could sleep easily during the day.  Additionally, she feels no energy from a physical standpoint and this has been a chronic issue.  She snores but she does not recall being told she has any apneic events in sleep.   PMP AWARE reviewed today: Her Soma and Ambien are prescribed by other providers.   No red flags.   Past Medical History:  Diagnosis Date   Allergic rhinitis    Arthritis    Cervical spondylosis 08/2020   C8 radiculopathy->Surgery 08/2020 (Dr. Joshua)   CHF (congestive heart failure) (HCC)    Chronic renal insufficiency, stage 3 (moderate)    GFR 50s   Colon cancer screening    09/2022 Cologuard negative.   COVID-19    12/2020   Diastolic dysfunction 09/2020 echo   grade II   Fibromyalgia    GERD (gastroesophageal reflux disease)    Heart defect    PFO--closure procedure approx 2009 (Dr. Ladona)   Hypercholesterolemia 08/2021   mild, frmhm cv risk 3.5%->TLC    Hypertension    Insomnia    Migraine syndrome    Mild mitral valve regurgitation 09/17/2020   09/2020 echo-->mild/mod MVR   prediab 08/2019   2020 Gluc 106->A1c 5.6%.  08/2021 Hba1c 6%.   Scoliosis    Vitamin B12 deficiency     Past Surgical History:  Procedure Laterality Date   APPENDECTOMY  6th grade   CARDIAC SURGERY     PFO closure 2009   CARDIOVASCULAR STRESS TEST  10/10/2020   chronotropic incompetence.  No ischemia, normal LV fxn, normal wall motion, EF 70%.   CERVICAL SPINE SURGERY  remote past; also 08/2020   discectomy x 1, then discectomy with titanium fusion. 08/2020 C spine surg again (Dr. Joshua)   CHOLECYSTECTOMY  prior to 2010   COLONOSCOPY     1990s --reason unknown   FOOT SURGERY  approx 1990   benign mass removed from bottom of foot   PFTs  01/13/2019   Minimal obstructive airway dz; no response to bronchodilator   SPINE SURGERY     TRANSTHORACIC ECHOCARDIOGRAM  09/17/2020   EF 60-65%, grd II DD, mild/mod MVR    Outpatient Medications Prior to Visit  Medication Sig Dispense Refill   albuterol  (VENTOLIN  HFA) 108 (90 Base) MCG/ACT inhaler INHALE TWO PUFFS BY MOUTH EVERY 6 HOURS AS NEEDED FOR WHEEZING OR FOR SHORTNESS OF BREATH 18 g 0   carisoprodol (SOMA) 350 MG tablet Take 350 mg by mouth 4 (  four) times daily.     cetirizine (ZYRTEC) 10 MG tablet Take 10 mg by mouth daily.     DULoxetine (CYMBALTA) 60 MG capsule Take 60 mg by mouth daily.     eletriptan (RELPAX) 40 MG tablet Take by mouth.     fluticasone  (FLONASE ) 50 MCG/ACT nasal spray Place into both nostrils daily.     Fluticasone  Furoate (ARNUITY ELLIPTA ) 200 MCG/ACT AEPB Take 1 puff by mouth daily. 30 each 1   magnesium gluconate (MAGONATE) 500 MG tablet Take 500 mg by mouth 2 (two) times daily.     Melatonin 5 MG TABS Take 10 mg by mouth daily as needed.      naratriptan (AMERGE) 2.5 MG tablet Take 2.5 mg by mouth as needed for migraine. Take one (1) tablet at onset of headache; if returns or does not  resolve, may repeat after 4 hours; do not exceed five (5) mg in 24 hours.     zolpidem (AMBIEN CR) 12.5 MG CR tablet Take 12.5 mg by mouth at bedtime as needed for sleep.     zonisamide (ZONEGRAN) 100 MG capsule Take 100 mg by mouth 2 (two) times daily.     furosemide  (LASIX ) 20 MG tablet TAKE ONE TABLET BY MOUTH ONE TIME DAILY 30 tablet 0   losartan  (COZAAR ) 100 MG tablet Take 1 tablet (100 mg total) by mouth daily. 90 tablet 1   metoprolol  succinate (TOPROL -XL) 100 MG 24 hr tablet TAKE ONE TABLET BY MOUTH ONE TIME DAILY IMMEDIATELY FOLLOWING A MEAL 30 tablet 0   pantoprazole  (PROTONIX ) 40 MG tablet TAKE ONE TABLET BY MOUTH ONE TIME DAILY 90 tablet 3   zolmitriptan (ZOMIG) 5 MG tablet Take by mouth. (Patient not taking: Reported on 11/17/2024)     No facility-administered medications prior to visit.    Allergies[1]  Review of Systems As per HPI  PE:    11/17/2024    2:42 PM 06/17/2024    5:42 PM 05/18/2024    2:08 PM  Vitals with BMI  Height   5' 5.5  Weight 175 lbs 10 oz  168 lbs 13 oz  BMI   27.65  Systolic 105 127 871  Diastolic 73 82 84  Pulse 74 94 75     Physical Exam  Gen: Alert, well appearing.  Patient is oriented to person, place, time, and situation. AFFECT: pleasant, lucid thought and speech. CV: RRR, no m/r/g.   LUNGS: CTA bilat, nonlabored resps, good aeration in all lung fields.  LABS: Last CBC Lab Results  Component Value Date   WBC 6.9 05/18/2024   HGB 13.6 05/18/2024   HCT 41.1 05/18/2024   MCV 94.5 05/18/2024   MCH 31.3 05/18/2024   RDW 12.7 05/18/2024   PLT 344 05/18/2024   Last metabolic panel Lab Results  Component Value Date   GLUCOSE 116 (H) 05/18/2024   NA 140 05/18/2024   K 4.2 05/18/2024   CL 104 05/18/2024   CO2 25 05/18/2024   BUN 13 05/18/2024   CREATININE 0.92 05/18/2024   EGFR 73 05/18/2024   CALCIUM 9.8 05/18/2024   PROT 6.8 05/18/2024   ALBUMIN 4.2 05/18/2023   BILITOT 0.4 05/18/2024   ALKPHOS 128 (H) 05/18/2023    AST 10 05/18/2024   ALT 14 05/18/2024   ANIONGAP 9 06/22/2023   Last lipids Lab Results  Component Value Date   CHOL 218 (H) 05/18/2024   HDL 39 (L) 05/18/2024   LDLCALC 148 (H) 05/18/2024   LDLDIRECT 117 (H)  09/28/2020   TRIG 174 (H) 05/18/2024   CHOLHDL 5.6 (H) 05/18/2024   Last hemoglobin A1c Lab Results  Component Value Date   HGBA1C 5.9 (H) 05/18/2024   Last thyroid  functions Lab Results  Component Value Date   TSH 1.73 05/18/2024   Last vitamin B12 and Folate Lab Results  Component Value Date   VITAMINB12 616 11/18/2023   IMPRESSION AND PLAN:  1 hypertension, well-controlled on Toprol -XL 100 mg a day and losartan  100 mg a day. Electrolytes and creatinine today.   2.  Prediabetes. Encouraged her to continue working on therapeutic lifestyle changes. Hemoglobin A1c today.    3.  Vitamin B12 deficiency. Her level came up to 616 on recheck 6 months ago.  Continue 1000 mcg B12 supplement.  Recheck level today.   #4 GAD and anxiety-related insomnia. Stable long-term on duloxetine 60 mg a day and Ambien CR 12.5 mg nightly  #5 chronic fatigue syndrome. She has excessive daytime sleepiness as well.  We have done appropriate lab workup in the past. I think it is a good idea to rule out obstructive sleep apnea as a potential contributor. Will refer to sleep MD today. Additionally, I am rechecking TSH and B12 and will also check iron panel.  An After Visit Summary was printed and given to the patient.  FOLLOW UP: No follow-ups on file.  Signed:  Gerlene Hockey, MD           11/17/2024     [1] No Known Allergies

## 2024-11-18 LAB — CBC WITH DIFFERENTIAL/PLATELET
Basophils Absolute: 0.1 K/uL (ref 0.0–0.1)
Basophils Relative: 0.8 % (ref 0.0–3.0)
Eosinophils Absolute: 0.2 K/uL (ref 0.0–0.7)
Eosinophils Relative: 3.3 % (ref 0.0–5.0)
HCT: 40.9 % (ref 36.0–46.0)
Hemoglobin: 13.8 g/dL (ref 12.0–15.0)
Lymphocytes Relative: 24.9 % (ref 12.0–46.0)
Lymphs Abs: 1.9 K/uL (ref 0.7–4.0)
MCHC: 33.8 g/dL (ref 30.0–36.0)
MCV: 94.4 fl (ref 78.0–100.0)
Monocytes Absolute: 0.5 K/uL (ref 0.1–1.0)
Monocytes Relative: 7.2 % (ref 3.0–12.0)
Neutro Abs: 4.7 K/uL (ref 1.4–7.7)
Neutrophils Relative %: 63.8 % (ref 43.0–77.0)
Platelets: 336 K/uL (ref 150.0–400.0)
RBC: 4.34 Mil/uL (ref 3.87–5.11)
RDW: 13 % (ref 11.5–15.5)
WBC: 7.4 K/uL (ref 4.0–10.5)

## 2024-11-18 LAB — TSH: TSH: 1.87 u[IU]/mL (ref 0.35–5.50)

## 2024-11-18 LAB — BASIC METABOLIC PANEL WITH GFR
BUN: 16 mg/dL (ref 6–23)
CO2: 27 meq/L (ref 19–32)
Calcium: 9.6 mg/dL (ref 8.4–10.5)
Chloride: 105 meq/L (ref 96–112)
Creatinine, Ser: 0.99 mg/dL (ref 0.40–1.20)
GFR: 63.7 mL/min (ref >60.00)
Glucose, Bld: 84 mg/dL (ref 70–99)
Potassium: 4.4 meq/L (ref 3.5–5.1)
Sodium: 141 meq/L (ref 135–145)

## 2024-11-18 LAB — HEMOGLOBIN A1C: Hgb A1c MFr Bld: 5.9 % (ref 4.6–6.5)

## 2024-11-18 LAB — IRON,TIBC AND FERRITIN PANEL
%SAT: 43 % (ref 16–45)
Ferritin: 15 ng/mL — ABNORMAL LOW (ref 16–232)
Iron: 152 ug/dL (ref 45–160)
TIBC: 354 ug/dL (ref 250–450)

## 2024-11-18 LAB — VITAMIN B12: Vitamin B-12: 227 pg/mL (ref 211–911)

## 2024-11-20 ENCOUNTER — Ambulatory Visit: Payer: Self-pay | Admitting: Family Medicine

## 2024-11-20 ENCOUNTER — Encounter: Payer: Self-pay | Admitting: Family Medicine

## 2024-12-03 ENCOUNTER — Other Ambulatory Visit: Payer: Self-pay | Admitting: Family Medicine

## 2024-12-09 ENCOUNTER — Encounter: Payer: Self-pay | Admitting: Family Medicine

## 2024-12-28 ENCOUNTER — Other Ambulatory Visit: Payer: Self-pay | Admitting: Family Medicine

## 2024-12-29 ENCOUNTER — Other Ambulatory Visit: Payer: Self-pay

## 2024-12-29 ENCOUNTER — Other Ambulatory Visit (HOSPITAL_BASED_OUTPATIENT_CLINIC_OR_DEPARTMENT_OTHER): Payer: Self-pay

## 2024-12-29 MED ORDER — ARNUITY ELLIPTA 200 MCG/ACT IN AEPB
1.0000 | INHALATION_SPRAY | Freq: Every day | RESPIRATORY_TRACT | 1 refills | Status: AC
  Filled 2024-12-29: qty 30, 30d supply, fill #0

## 2025-01-04 ENCOUNTER — Ambulatory Visit

## 2025-01-12 ENCOUNTER — Other Ambulatory Visit (HOSPITAL_BASED_OUTPATIENT_CLINIC_OR_DEPARTMENT_OTHER): Payer: Self-pay
